# Patient Record
Sex: Female | Born: 1975 | Race: White | Hispanic: No | Smoking: Current every day smoker
Health system: Southern US, Community
[De-identification: ages and names within clinical notes are randomized; demographics above are authoritative.]

## PROBLEM LIST (undated history)

## (undated) DIAGNOSIS — G8929 Other chronic pain: Secondary | ICD-10-CM

## (undated) DIAGNOSIS — J449 Chronic obstructive pulmonary disease, unspecified: Secondary | ICD-10-CM

## (undated) DIAGNOSIS — M109 Gout, unspecified: Secondary | ICD-10-CM

## (undated) DIAGNOSIS — M549 Dorsalgia, unspecified: Secondary | ICD-10-CM

## (undated) DIAGNOSIS — F419 Anxiety disorder, unspecified: Secondary | ICD-10-CM

## (undated) DIAGNOSIS — F32A Depression, unspecified: Secondary | ICD-10-CM

## (undated) DIAGNOSIS — I10 Essential (primary) hypertension: Secondary | ICD-10-CM

## (undated) DIAGNOSIS — N189 Chronic kidney disease, unspecified: Secondary | ICD-10-CM

## (undated) DIAGNOSIS — F329 Major depressive disorder, single episode, unspecified: Secondary | ICD-10-CM

---

## 2003-01-22 ENCOUNTER — Ambulatory Visit (HOSPITAL_COMMUNITY): Admission: RE | Admit: 2003-01-22 | Discharge: 2003-01-22 | Payer: Self-pay | Admitting: Preventative Medicine

## 2003-01-22 ENCOUNTER — Encounter: Payer: Self-pay | Admitting: Preventative Medicine

## 2004-12-12 ENCOUNTER — Ambulatory Visit (HOSPITAL_COMMUNITY): Admission: RE | Admit: 2004-12-12 | Discharge: 2004-12-12 | Payer: Self-pay | Admitting: Preventative Medicine

## 2004-12-23 ENCOUNTER — Encounter: Admission: RE | Admit: 2004-12-23 | Discharge: 2004-12-23 | Payer: Self-pay | Admitting: Preventative Medicine

## 2005-01-06 ENCOUNTER — Encounter: Admission: RE | Admit: 2005-01-06 | Discharge: 2005-01-06 | Payer: Self-pay | Admitting: Preventative Medicine

## 2005-01-23 ENCOUNTER — Encounter: Admission: RE | Admit: 2005-01-23 | Discharge: 2005-01-23 | Payer: Self-pay | Admitting: Preventative Medicine

## 2005-01-27 ENCOUNTER — Ambulatory Visit (HOSPITAL_COMMUNITY): Admission: RE | Admit: 2005-01-27 | Discharge: 2005-01-27 | Payer: Self-pay | Admitting: Preventative Medicine

## 2005-05-19 ENCOUNTER — Emergency Department (HOSPITAL_COMMUNITY): Admission: EM | Admit: 2005-05-19 | Discharge: 2005-05-19 | Payer: Self-pay | Admitting: Emergency Medicine

## 2005-08-23 ENCOUNTER — Emergency Department (HOSPITAL_COMMUNITY): Admission: EM | Admit: 2005-08-23 | Discharge: 2005-08-23 | Payer: Self-pay | Admitting: Emergency Medicine

## 2005-11-14 ENCOUNTER — Emergency Department (HOSPITAL_COMMUNITY): Admission: EM | Admit: 2005-11-14 | Discharge: 2005-11-14 | Payer: Self-pay | Admitting: Emergency Medicine

## 2006-11-21 ENCOUNTER — Ambulatory Visit: Payer: Self-pay | Admitting: Family Medicine

## 2007-04-10 ENCOUNTER — Emergency Department (HOSPITAL_COMMUNITY): Admission: EM | Admit: 2007-04-10 | Discharge: 2007-04-10 | Payer: Self-pay | Admitting: Emergency Medicine

## 2007-04-17 ENCOUNTER — Emergency Department (HOSPITAL_COMMUNITY): Admission: EM | Admit: 2007-04-17 | Discharge: 2007-04-17 | Payer: Self-pay | Admitting: Emergency Medicine

## 2007-07-05 ENCOUNTER — Emergency Department (HOSPITAL_COMMUNITY): Admission: EM | Admit: 2007-07-05 | Discharge: 2007-07-05 | Payer: Self-pay | Admitting: Emergency Medicine

## 2007-09-14 ENCOUNTER — Emergency Department (HOSPITAL_COMMUNITY): Admission: EM | Admit: 2007-09-14 | Discharge: 2007-09-14 | Payer: Self-pay | Admitting: Emergency Medicine

## 2007-10-02 ENCOUNTER — Emergency Department (HOSPITAL_COMMUNITY): Admission: EM | Admit: 2007-10-02 | Discharge: 2007-10-02 | Payer: Self-pay | Admitting: Emergency Medicine

## 2008-08-22 ENCOUNTER — Emergency Department (HOSPITAL_COMMUNITY): Admission: EM | Admit: 2008-08-22 | Discharge: 2008-08-22 | Payer: Self-pay | Admitting: Emergency Medicine

## 2008-12-21 ENCOUNTER — Emergency Department (HOSPITAL_COMMUNITY): Admission: EM | Admit: 2008-12-21 | Discharge: 2008-12-21 | Payer: Self-pay | Admitting: Emergency Medicine

## 2009-03-05 ENCOUNTER — Emergency Department (HOSPITAL_COMMUNITY): Admission: EM | Admit: 2009-03-05 | Discharge: 2009-03-05 | Payer: Self-pay | Admitting: Emergency Medicine

## 2009-04-23 ENCOUNTER — Emergency Department (HOSPITAL_COMMUNITY): Admission: EM | Admit: 2009-04-23 | Discharge: 2009-04-23 | Payer: Self-pay | Admitting: Emergency Medicine

## 2009-06-13 ENCOUNTER — Emergency Department (HOSPITAL_COMMUNITY): Admission: EM | Admit: 2009-06-13 | Discharge: 2009-06-13 | Payer: Self-pay | Admitting: Emergency Medicine

## 2009-10-01 ENCOUNTER — Emergency Department (HOSPITAL_COMMUNITY): Admission: EM | Admit: 2009-10-01 | Discharge: 2009-10-01 | Payer: Self-pay | Admitting: Emergency Medicine

## 2010-01-29 ENCOUNTER — Emergency Department (HOSPITAL_COMMUNITY): Admission: EM | Admit: 2010-01-29 | Discharge: 2010-01-29 | Payer: Self-pay | Admitting: Emergency Medicine

## 2010-11-07 LAB — POCT CARDIAC MARKERS
CKMB, poc: 1.5 ng/mL (ref 1.0–8.0)
Troponin i, poc: <0.05 ng/mL (ref 0.00–0.09)

## 2010-11-07 LAB — D-DIMER, QUANTITATIVE: D-Dimer, Quant: 0.22 ug/mL-FEU (ref 0.00–0.48)

## 2010-11-09 LAB — DIFFERENTIAL
Basophils Absolute: 0 10*3/uL (ref 0.0–0.1)
Eosinophils Relative: 1 % (ref 0–5)
Monocytes Relative: 5 % (ref 3–12)
Neutro Abs: 5.8 10*3/uL (ref 1.7–7.7)

## 2010-11-09 LAB — CBC
HCT: 43.8 % (ref 36.0–46.0)
Platelets: 209 10*3/uL (ref 150–400)
RBC: 4.97 MIL/uL (ref 3.87–5.11)
RDW: 12.9 % (ref 11.5–15.5)
WBC: 7.8 10*3/uL (ref 4.0–10.5)

## 2010-11-09 LAB — URINALYSIS, ROUTINE W REFLEX MICROSCOPIC
Bilirubin Urine: NEGATIVE
Ketones, ur: NEGATIVE mg/dL
Leukocytes, UA: NEGATIVE
Protein, ur: >300 mg/dL — AB
Specific Gravity, Urine: 1.03 (ref 1.005–1.030)
Urobilinogen, UA: 0.2 mg/dL (ref 0.0–1.0)

## 2010-11-09 LAB — COMPREHENSIVE METABOLIC PANEL
AST: 16 U/L (ref 0–37)
Albumin: 3.5 g/dL (ref 3.5–5.2)
Alkaline Phosphatase: 49 U/L (ref 39–117)
GFR calc Af Amer: 57 mL/min — ABNORMAL LOW (ref >60)
Glucose, Bld: 95 mg/dL (ref 70–99)
Potassium: 4.1 mEq/L (ref 3.5–5.1)
Total Bilirubin: 0.6 mg/dL (ref 0.3–1.2)
Total Protein: 6.7 g/dL (ref 6.0–8.3)

## 2010-11-27 LAB — URINALYSIS, ROUTINE W REFLEX MICROSCOPIC
Bilirubin Urine: NEGATIVE
Glucose, UA: NEGATIVE mg/dL
Ketones, ur: NEGATIVE mg/dL
Nitrite: NEGATIVE
Specific Gravity, Urine: 1.025 (ref 1.005–1.030)

## 2014-07-28 ENCOUNTER — Emergency Department (HOSPITAL_COMMUNITY)
Admission: EM | Admit: 2014-07-28 | Discharge: 2014-07-28 | Disposition: A | Discharge status: Home / Self Care | Payer: Self-pay

## 2014-07-28 NOTE — ED Notes (Signed)
No answer

## 2014-11-05 ENCOUNTER — Emergency Department (HOSPITAL_COMMUNITY): Payer: Self-pay

## 2014-11-05 ENCOUNTER — Encounter (HOSPITAL_COMMUNITY): Payer: Self-pay | Admitting: *Deleted

## 2014-11-05 ENCOUNTER — Inpatient Hospital Stay (HOSPITAL_COMMUNITY)
Admission: EM | Admit: 2014-11-05 | Discharge: 2014-11-12 | DRG: 871 | Disposition: A | Discharge status: Home / Self Care | Payer: Self-pay | Attending: Internal Medicine | Admitting: Internal Medicine

## 2014-11-05 DIAGNOSIS — I10 Essential (primary) hypertension: Secondary | ICD-10-CM

## 2014-11-05 DIAGNOSIS — F17210 Nicotine dependence, cigarettes, uncomplicated: Secondary | ICD-10-CM | POA: Diagnosis present

## 2014-11-05 DIAGNOSIS — J189 Pneumonia, unspecified organism: Secondary | ICD-10-CM

## 2014-11-05 DIAGNOSIS — J984 Other disorders of lung: Secondary | ICD-10-CM

## 2014-11-05 DIAGNOSIS — Z79899 Other long term (current) drug therapy: Secondary | ICD-10-CM

## 2014-11-05 DIAGNOSIS — N189 Chronic kidney disease, unspecified: Secondary | ICD-10-CM | POA: Diagnosis present

## 2014-11-05 DIAGNOSIS — J852 Abscess of lung without pneumonia: Secondary | ICD-10-CM | POA: Diagnosis present

## 2014-11-05 DIAGNOSIS — R0602 Shortness of breath: Secondary | ICD-10-CM

## 2014-11-05 DIAGNOSIS — N170 Acute kidney failure with tubular necrosis: Secondary | ICD-10-CM | POA: Diagnosis present

## 2014-11-05 DIAGNOSIS — F419 Anxiety disorder, unspecified: Secondary | ICD-10-CM | POA: Diagnosis present

## 2014-11-05 DIAGNOSIS — R509 Fever, unspecified: Secondary | ICD-10-CM

## 2014-11-05 DIAGNOSIS — N181 Chronic kidney disease, stage 1: Secondary | ICD-10-CM | POA: Diagnosis present

## 2014-11-05 DIAGNOSIS — D72829 Elevated white blood cell count, unspecified: Secondary | ICD-10-CM | POA: Diagnosis present

## 2014-11-05 DIAGNOSIS — N179 Acute kidney failure, unspecified: Secondary | ICD-10-CM | POA: Diagnosis present

## 2014-11-05 DIAGNOSIS — G8929 Other chronic pain: Secondary | ICD-10-CM | POA: Diagnosis present

## 2014-11-05 DIAGNOSIS — J851 Abscess of lung with pneumonia: Secondary | ICD-10-CM | POA: Diagnosis present

## 2014-11-05 DIAGNOSIS — A419 Sepsis, unspecified organism: Principal | ICD-10-CM | POA: Diagnosis present

## 2014-11-05 DIAGNOSIS — D649 Anemia, unspecified: Secondary | ICD-10-CM | POA: Diagnosis present

## 2014-11-05 DIAGNOSIS — M549 Dorsalgia, unspecified: Secondary | ICD-10-CM | POA: Diagnosis present

## 2014-11-05 DIAGNOSIS — I129 Hypertensive chronic kidney disease with stage 1 through stage 4 chronic kidney disease, or unspecified chronic kidney disease: Secondary | ICD-10-CM | POA: Diagnosis present

## 2014-11-05 DIAGNOSIS — E876 Hypokalemia: Secondary | ICD-10-CM | POA: Diagnosis not present

## 2014-11-05 HISTORY — DX: Essential (primary) hypertension: I10

## 2014-11-05 HISTORY — DX: Dorsalgia, unspecified: M54.9

## 2014-11-05 HISTORY — DX: Other chronic pain: G89.29

## 2014-11-05 HISTORY — DX: Anxiety disorder, unspecified: F41.9

## 2014-11-05 LAB — CBC WITH DIFFERENTIAL/PLATELET
BASOS PCT: 0 % (ref 0–1)
Basophils Absolute: 0 10*3/uL (ref 0.0–0.1)
Eosinophils Absolute: 0.1 10*3/uL (ref 0.0–0.7)
Eosinophils Relative: 1 % (ref 0–5)
HCT: 33.6 % — ABNORMAL LOW (ref 36.0–46.0)
Hemoglobin: 11.3 g/dL — ABNORMAL LOW (ref 12.0–15.0)
LYMPHS PCT: 32 % (ref 12–46)
Lymphs Abs: 4.3 10*3/uL — ABNORMAL HIGH (ref 0.7–4.0)
MCH: 29.7 pg (ref 26.0–34.0)
MCHC: 33.6 g/dL (ref 30.0–36.0)
MCV: 88.4 fL (ref 78.0–100.0)
MONOS PCT: 8 % (ref 3–12)
Monocytes Absolute: 1.1 10*3/uL — ABNORMAL HIGH (ref 0.1–1.0)
NEUTROS ABS: 7.8 10*3/uL — AB (ref 1.7–7.7)
Neutrophils Relative %: 59 % (ref 43–77)
Platelets: 337 10*3/uL (ref 150–400)
RBC: 3.8 MIL/uL — ABNORMAL LOW (ref 3.87–5.11)
RDW: 13.3 % (ref 11.5–15.5)
WBC: 13.2 10*3/uL — ABNORMAL HIGH (ref 4.0–10.5)

## 2014-11-05 LAB — COMPREHENSIVE METABOLIC PANEL
ALT: 23 U/L (ref 0–35)
ANION GAP: 6 (ref 5–15)
AST: 14 U/L (ref 0–37)
Albumin: 2.9 g/dL — ABNORMAL LOW (ref 3.5–5.2)
Alkaline Phosphatase: 56 U/L (ref 39–117)
BUN: 12 mg/dL (ref 6–23)
CALCIUM: 8.2 mg/dL — AB (ref 8.4–10.5)
CO2: 20 mmol/L (ref 19–32)
Chloride: 112 mmol/L (ref 96–112)
Creatinine, Ser: 1.4 mg/dL — ABNORMAL HIGH (ref 0.50–1.10)
GFR calc Af Amer: 54 mL/min — ABNORMAL LOW (ref >90)
GFR calc non Af Amer: 47 mL/min — ABNORMAL LOW (ref >90)
GLUCOSE: 99 mg/dL (ref 70–99)
Potassium: 3.5 mmol/L (ref 3.5–5.1)
Sodium: 138 mmol/L (ref 135–145)
Total Bilirubin: 0.4 mg/dL (ref 0.3–1.2)
Total Protein: 6.7 g/dL (ref 6.0–8.3)

## 2014-11-05 LAB — URINALYSIS, ROUTINE W REFLEX MICROSCOPIC
Bilirubin Urine: NEGATIVE
Glucose, UA: NEGATIVE mg/dL
Hgb urine dipstick: NEGATIVE
Ketones, ur: NEGATIVE mg/dL
Leukocytes, UA: NEGATIVE
Nitrite: NEGATIVE
PH: 5.5 (ref 5.0–8.0)
Protein, ur: NEGATIVE mg/dL
SPECIFIC GRAVITY, URINE: 1.02 (ref 1.005–1.030)
UROBILINOGEN UA: 0.2 mg/dL (ref 0.0–1.0)

## 2014-11-05 MED ORDER — LORAZEPAM 1 MG PO TABS
1.0000 mg | ORAL_TABLET | Freq: Four times a day (QID) | ORAL | Status: DC | PRN
  Administered 2014-11-05 – 2014-11-09 (×7): 1 mg via ORAL
  Filled 2014-11-05 (×7): qty 1

## 2014-11-05 MED ORDER — IPRATROPIUM-ALBUTEROL 0.5-2.5 (3) MG/3ML IN SOLN
3.0000 mL | Freq: Once | RESPIRATORY_TRACT | Status: AC
  Administered 2014-11-05: 3 mL via RESPIRATORY_TRACT
  Filled 2014-11-05: qty 3

## 2014-11-05 MED ORDER — MORPHINE SULFATE 2 MG/ML IJ SOLN
2.0000 mg | Freq: Once | INTRAMUSCULAR | Status: AC
  Administered 2014-11-05: 2 mg via INTRAVENOUS
  Filled 2014-11-05: qty 1

## 2014-11-05 MED ORDER — VANCOMYCIN HCL IN DEXTROSE 1-5 GM/200ML-% IV SOLN
1000.0000 mg | Freq: Once | INTRAVENOUS | Status: AC
  Administered 2014-11-05: 1000 mg via INTRAVENOUS
  Filled 2014-11-05: qty 200

## 2014-11-05 MED ORDER — ONDANSETRON 4 MG PO TBDP
4.0000 mg | ORAL_TABLET | Freq: Once | ORAL | Status: AC
  Administered 2014-11-05: 4 mg via ORAL
  Filled 2014-11-05: qty 1

## 2014-11-05 MED ORDER — PIPERACILLIN-TAZOBACTAM 3.375 G IVPB
3.3750 g | Freq: Three times a day (TID) | INTRAVENOUS | Status: DC
  Administered 2014-11-05 – 2014-11-12 (×20): 3.375 g via INTRAVENOUS
  Filled 2014-11-05 (×23): qty 50

## 2014-11-05 MED ORDER — BENZONATATE 100 MG PO CAPS
ORAL_CAPSULE | ORAL | Status: AC
  Filled 2014-11-05: qty 2

## 2014-11-05 MED ORDER — IOHEXOL 300 MG/ML  SOLN
100.0000 mL | Freq: Once | INTRAMUSCULAR | Status: AC | PRN
Start: 2014-11-05 — End: 2014-11-05

## 2014-11-05 MED ORDER — LISINOPRIL-HYDROCHLOROTHIAZIDE 10-12.5 MG PO TABS
1.0000 | ORAL_TABLET | Freq: Every day | ORAL | Status: DC
  Administered 2014-11-05: 1 via ORAL

## 2014-11-05 MED ORDER — VANCOMYCIN HCL IN DEXTROSE 750-5 MG/150ML-% IV SOLN
750.0000 mg | Freq: Two times a day (BID) | INTRAVENOUS | Status: DC
  Administered 2014-11-06 – 2014-11-11 (×11): 750 mg via INTRAVENOUS
  Filled 2014-11-05 (×16): qty 150

## 2014-11-05 MED ORDER — BENZONATATE 100 MG PO CAPS
200.0000 mg | ORAL_CAPSULE | Freq: Once | ORAL | Status: AC
  Administered 2014-11-05: 200 mg via ORAL

## 2014-11-05 MED ORDER — HYDROCHLOROTHIAZIDE 12.5 MG PO CAPS
12.5000 mg | ORAL_CAPSULE | Freq: Every day | ORAL | Status: DC
  Administered 2014-11-06 – 2014-11-12 (×7): 12.5 mg via ORAL
  Filled 2014-11-05 (×7): qty 1

## 2014-11-05 MED ORDER — IOHEXOL 300 MG/ML  SOLN
80.0000 mL | Freq: Once | INTRAMUSCULAR | Status: AC | PRN
  Administered 2014-11-05: 80 mL via INTRAVENOUS

## 2014-11-05 MED ORDER — HEPARIN SODIUM (PORCINE) 5000 UNIT/ML IJ SOLN
5000.0000 [IU] | Freq: Three times a day (TID) | INTRAMUSCULAR | Status: DC
  Administered 2014-11-06 – 2014-11-12 (×18): 5000 [IU] via SUBCUTANEOUS
  Filled 2014-11-05 (×15): qty 1

## 2014-11-05 MED ORDER — HYDROCOD POLST-CHLORPHEN POLST 10-8 MG/5ML PO LQCR
5.0000 mL | Freq: Once | ORAL | Status: AC
  Administered 2014-11-05: 5 mL via ORAL
  Filled 2014-11-05: qty 5

## 2014-11-05 MED ORDER — OXYCODONE-ACETAMINOPHEN 5-325 MG PO TABS
1.0000 | ORAL_TABLET | Freq: Once | ORAL | Status: AC
  Administered 2014-11-05: 1 via ORAL
  Filled 2014-11-05: qty 1

## 2014-11-05 MED ORDER — LISINOPRIL 10 MG PO TABS
10.0000 mg | ORAL_TABLET | Freq: Every day | ORAL | Status: DC
  Administered 2014-11-06 – 2014-11-12 (×7): 10 mg via ORAL
  Filled 2014-11-05 (×7): qty 1

## 2014-11-05 MED ORDER — SODIUM CHLORIDE 0.9 % IV SOLN
INTRAVENOUS | Status: DC
  Administered 2014-11-05 – 2014-11-06 (×3): via INTRAVENOUS
  Administered 2014-11-06 – 2014-11-08 (×4): 1 mL via INTRAVENOUS
  Administered 2014-11-09 – 2014-11-12 (×4): via INTRAVENOUS
  Filled 2014-11-05: qty 1000

## 2014-11-05 NOTE — ED Notes (Signed)
Report given to Vania Rea, RN with [facility]

## 2014-11-05 NOTE — ED Provider Notes (Signed)
CSN: [id number]     Arrival date & time 11/05/14  1105 History   First MD Initiated Contact with Patient 11/05/14 1259     Chief Complaint  Patient presents with  . Cough     (Consider location/radiation/quality/duration/timing/severity/associated sxs/prior Treatment) Patient is a 39 y.o. female presenting with cough. The history is provided by the patient.  Cough Cough characteristics:  Productive Sputum characteristics:  Green Severity:  Severe Onset quality:  Gradual Duration:  1 week Timing:  Constant Progression:  Worsening Chronicity:  New Smoker: yes   Relieved by:  Nothing Worsened by:  Activity, lying down and smoking Ineffective treatments: OTC cough and congestion. Associated symptoms: chest pain, chills and shortness of breath    Margaret Yoder is a 39 y.o. female with a hx of smoking 1 and 1/2 packs per day x 15 years. She complains of cough for over a week that has gotten progressively worse. She states she could not sleep last night for coughing so much. She complains of feeling really weak and tired. She reports the cough is productive with green sputum that has a really bad taste. She feels short of breath. She denies having traveled out of the country recently.   Past Medical History  Diagnosis Date  . Hypertension   . Anxiety   . Chronic back pain    History reviewed. No pertinent past surgical history. No family history on file. History  Substance Use Topics  . Smoking status: Current Every Day Smoker    Types: Cigarettes  . Smokeless tobacco: Not on file  . Alcohol Use: No   OB History    No data available     Review of Systems  Constitutional: Positive for chills.  Respiratory: Positive for cough and shortness of breath.   Cardiovascular: Positive for chest pain.  Neurological: Positive for weakness and light-headedness.  all other systems negtive    Allergies  Review of patient's allergies indicates no known allergies.  Home  Medications   Prior to Admission medications   Medication Sig Start Date End Date Taking? Authorizing Provider  lisinopril-hydrochlorothiazide (PRINZIDE,ZESTORETIC) 10-12.5 MG per tablet Take 1 tablet by mouth daily.   Yes Historical Provider, MD  LORazepam (ATIVAN) 1 MG tablet Take 1 mg by mouth every 6 (six) hours as needed for anxiety.   Yes Historical Provider, MD   BP 126/87 mmHg  Pulse 83  Temp(Src) 97.8 F (36.6 C) (Oral)  Resp 14  Ht 5\' 3"  (1.6 m)  Wt 170 lb (77.111 kg)  BMI 30.12 kg/m2  SpO2 100%  LMP 11/02/2014 Physical Exam  Constitutional: She is oriented to person, place, and time. She appears well-developed and well-nourished.  HENT:  Head: Normocephalic.  Eyes: Conjunctivae and EOM are normal.  Neck: Neck supple.  Cardiovascular: Normal rate and regular rhythm.   Pulmonary/Chest: No respiratory distress. She has decreased breath sounds in the right upper field and the right middle field. She has wheezes. She has no rales.  Abdominal: Soft. There is no tenderness.  Musculoskeletal: Normal range of motion.  Neurological: She is alert and oriented to person, place, and time. No cranial nerve deficit.  Skin: Skin is warm and dry.  Psychiatric: She has a normal mood and affect. Her behavior is normal.  Nursing note and vitals reviewed.   ED Course  Procedures (including critical care time) Results for orders placed or performed during the hospital encounter of 11/05/14 (from the past 24 hour(s))  CBC with Differential/Platelet  Status: Abnormal   Collection Time: 11/05/14  4:20 PM  Result Value Ref Range   WBC 13.2 (H) 4.0 - 10.5 K/uL   RBC 3.80 (L) 3.87 - 5.11 MIL/uL   Hemoglobin 11.3 (L) 12.0 - 15.0 g/dL   HCT 33.6 (L) 36.0 - 46.0 %   MCV 88.4 78.0 - 100.0 fL   MCH 29.7 26.0 - 34.0 pg   MCHC 33.6 30.0 - 36.0 g/dL   RDW 13.3 11.5 - 15.5 %   Platelets 337 150 - 400 K/uL   Neutrophils Relative % 59 43 - 77 %   Neutro Abs 7.8 (H) 1.7 - 7.7 K/uL    Lymphocytes Relative 32 12 - 46 %   Lymphs Abs 4.3 (H) 0.7 - 4.0 K/uL   Monocytes Relative 8 3 - 12 %   Monocytes Absolute 1.1 (H) 0.1 - 1.0 K/uL   Eosinophils Relative 1 0 - 5 %   Eosinophils Absolute 0.1 0.0 - 0.7 K/uL   Basophils Relative 0 0 - 1 %   Basophils Absolute 0.0 0.0 - 0.1 K/uL  Comprehensive metabolic panel     Status: Abnormal   Collection Time: 11/05/14  4:20 PM  Result Value Ref Range   Sodium 138 135 - 145 mmol/L   Potassium 3.5 3.5 - 5.1 mmol/L   Chloride 112 96 - 112 mmol/L   CO2 20 19 - 32 mmol/L   Glucose, Bld 99 70 - 99 mg/dL   BUN 12 6 - 23 mg/dL   Creatinine, Ser 1.40 (H) 0.50 - 1.10 mg/dL   Calcium 8.2 (L) 8.4 - 10.5 mg/dL   Total Protein 6.7 6.0 - 8.3 g/dL   Albumin 2.9 (L) 3.5 - 5.2 g/dL   AST 14 0 - 37 U/L   ALT 23 0 - 35 U/L   Alkaline Phosphatase 56 39 - 117 U/L   Total Bilirubin 0.4 0.3 - 1.2 mg/dL   GFR calc non Af Amer 47 (L) >90 mL/min   GFR calc Af Amer 54 (L) >90 mL/min   Anion gap 6 5 - 15    Imaging Review Dg Chest 2 View  11/05/2014   CLINICAL DATA:  Initial evaluation cough shortness of breath 1 week, smoking history  EXAM: CHEST  2 VIEW  COMPARISON:  05/30/2011  FINDINGS: Heart size and vascular pattern are normal. Left lung is clear. There is abnormal masslike density in the right hilum, measuring about 5 x 5 cm.There is no pleural effusion.  IMPRESSION: Mass-like right perihilar density. Malignancy expected. Contrast-enhanced CT thorax recommended.   Electronically Signed   By: Skipper Cliche M.D.   On: 11/05/2014 12:05   Ct Chest W Contrast  11/05/2014   CLINICAL DATA:  Productive cough. Suspicious mass-like density in the right hilum on recent chest radiograph.  EXAM: CT CHEST WITH CONTRAST  TECHNIQUE: Multidetector CT imaging of the chest was performed during intravenous contrast administration.  CONTRAST:  45mL OMNIPAQUE IOHEXOL 300 MG/ML  SOLN  COMPARISON:  None.  FINDINGS: Thyroid tissues is diffusely enlarged and mildly  heterogeneous. Isthmus measures 2 cm in thickness. Precarinal soft tissue measures 1.3 cm in the short axis on sequence 2, image 27. There is an upper right paratracheal lymph node that measures 0.9 cm in short axis on sequence 2, image 14. Tiny supraclavicular lymph nodes on both sides. There is no significant axillary lymphadenopathy. No significant pericardial or pleural fluid.  Trachea and mainstem bronchi are patent. There is an air-fluid collection in the central  right lung which corresponds with the hilar mass on the recent chest radiograph. This lesion roughly measures 4.0 x 3.8 x 3.6 cm. There are small parenchymal densities in the right upper lobe with a tree-in-bud configuration. The central right lung lesion is located in the right upper lobe and bulging into the right minor fissure. Left lung is clear.  There is a large calcified gallstone measuring up to 2.5 cm. Normal appearance of the adrenal glands. No significant lymphadenopathy in the upper abdomen.  No acute bone abnormality.  IMPRESSION: There is a cavitary lesion in the central right upper lobe. This structure measures up to 4 cm and contains both air and fluid. Findings are concerning for a pulmonary abscess. A cavitary lung neoplasm is also in the differential diagnosis. There are small lymph nodes in the mediastinum which are indeterminate.  Parenchymal densities in the right upper lobe with a tree-in-bud configuration. These parenchymal densities are suggestive for an infectious or inflammatory process.  Gallstone.  Thyromegaly of unknown etiology.  These results were called by telephone at the time of interpretation on 11/05/2014 at 3:40 pm to Dr. Alvino Chapel , who verbally acknowledged these results.   Electronically Signed   By: Markus Daft M.D.   On: 11/05/2014 15:41   Labs, x-ray, CT of chest, Vancomycin 1 gram IV, consult with Dr.Gosrani  MDM  39 y.o. female with shortness of breath and cough that has been ongoing for the past week.  Symptoms have gotten much worsen over the past 2 days. Consult with Dr. Anastasio Champion and he will see the patient in the ED and assess for admission for [facility]. Discussed with the patient clinical, x-ray and CT findings and plan of care and all questioned fully answered.   Final diagnoses:  Shortness of breath  Cavitating mass in right upper lung lobe      [facility], NP 11/05/14 1729  Milton Ferguson, MD 11/09/14 [phone]

## 2014-11-05 NOTE — ED Notes (Signed)
Sick for 3-4 days, with cough/fever.  Feels sob.  Hx of bronchitits

## 2014-11-05 NOTE — ED Notes (Signed)
Pt states productive cough at times, green in color, stating she feels like she has bronchitis. Cough x 1 week. Unable to sleep last night due to the cough.

## 2014-11-05 NOTE — ED Notes (Signed)
Resting quietly, Dr Alvino Chapel in to see pt.

## 2014-11-05 NOTE — ED Notes (Signed)
Report given to Apolonio Schneiders RN at [facility]

## 2014-11-05 NOTE — Progress Notes (Addendum)
ANTIBIOTIC CONSULT NOTE  Pharmacy Consult for Vancomycin & Zosyn Indication: pneumonia  No Known Allergies  Patient Measurements: Height: 5\' 3"  (160 cm) Weight: 170 lb (77.111 kg) IBW/kg (Calculated) : 52.4  Vital Signs: Temp: 98 F (36.7 C) (03/17 1846) Temp Source: Oral (03/17 1846) BP: 124/77 mmHg (03/17 2000) Pulse Rate: 89 (03/17 2000) Intake/Output from previous day:   Intake/Output from this shift:    Labs:  Recent Labs  11/05/14 1620  WBC 13.2*  HGB 11.3*  PLT 337  CREATININE 1.40*   Estimated Creatinine Clearance: 53.1 mL/min (by C-G formula based on Cr of 1.4). No results for input(s): VANCOTROUGH, VANCOPEAK, VANCORANDOM, GENTTROUGH, GENTPEAK, GENTRANDOM, TOBRATROUGH, TOBRAPEAK, TOBRARND, AMIKACINPEAK, AMIKACINTROU, AMIKACIN in the last 72 hours.   Microbiology: Recent Results (from the past 720 hour(s))  Culture, blood (routine x 2) Call MD if unable to obtain prior to antibiotics being given     Status: None (Preliminary result)   Collection Time: 11/05/14  4:20 PM  Result Value Ref Range Status   Specimen Description BLOOD LEFT ARM  Final   Special Requests BOTTLES DRAWN AEROBIC AND ANAEROBIC [place]  Final   Culture PENDING  Incomplete   Report Status PENDING  Incomplete  Culture, blood (routine x 2) Call MD if unable to obtain prior to antibiotics being given     Status: None (Preliminary result)   Collection Time: 11/05/14  6:52 PM  Result Value Ref Range Status   Specimen Description BLOOD LEFT ARM  Final   Special Requests BOTTLES DRAWN AEROBIC AND ANAEROBIC [facility] EACH  Final   Culture PENDING  Incomplete   Report Status PENDING  Incomplete    Anti-infectives    Start     Dose/Rate Route Frequency Ordered Stop   11/05/14 1830  piperacillin-tazobactam (ZOSYN) IVPB 3.375 g     3.375 g 12.5 mL/hr over 240 Minutes Intravenous Every 8 hours 11/05/14 1823     11/05/14 1615  vancomycin (VANCOCIN) IVPB 1000 mg/200 mL premix     1,000 mg 200 mL/hr  over 60 Minutes Intravenous  Once 11/05/14 1604 11/05/14 1715      Assessment: 53 yoF who presents with productive cough & fever x1 week. Chest CT + cavitary lesion, abscess.   She has been afebrile since admission with mild leukocytosis.   Scr currently above patient's baseline.  Estimated CrCl ~ 50-8ml/min.  Vancomycin 1gm already given in ED. Patient transferring to [facility] for [facility] evaluation.   Zosyn 3/17>> Vancomycin 3/17>>  Goal of Therapy:  Vancomycin trough level 15-20 mcg/ml  Eradicate infection.  Plan:  Zosyn 3.375gm IV Q8h to be infused over 4hrs Vancomycin 750mg  IV q12h Check Vancomycin trough at steady state Monitor renal function and cx data  Duration of therapy per MD Consider change Zosyn to Rocephin + Zithromax for better coverage of community-acquired pathogens (legionella coverage)  Biagio Borg 11/05/2014,10:09 PM

## 2014-11-05 NOTE — H&P (Signed)
[facility] History and Physical  Margaret Yoder [id number] DOB: 1975-12-04 DOA: 11/05/2014  Referring physician: ER PCP: No primary care provider on file.   Chief Complaint: Cough.  HPI: Margaret Yoder is a 39 y.o. female  This is a 39 year old lady, smoker, who presents with one-week history of a productive cough of green/brown sputum associated with subjective fever. She also feels slightly short of breath more than usual. She has been feeling weak/fatigue. She also has had some chills. She says she has lost weight but she quantifies this as less than 10 pounds in the last few months. She denies any hemoptysis. There is no sweating, especially night sweats. She has not had any travel abroad. She has no history of diabetes or HIV disease that she knows about. Evaluation in the emergency room with a CT scan of the chest is consistent with an abscess, neoplasm cannot be excluded. She is now being admitted for further investigation and management.   Review of Systems:  Apart from symptoms above, all systems negative.  Past Medical History  Diagnosis Date  . Hypertension   . Anxiety   . Chronic back pain    History reviewed. No pertinent past surgical history. Social History:  reports that she has been smoking Cigarettes.  She does not have any smokeless tobacco history on file. She reports that she does not drink alcohol. Her drug history is not on file.  No Known Allergies   Family history: No family history of lung disease.   Prior to Admission medications   Medication Sig Start Date End Date Taking? Authorizing Provider  lisinopril-hydrochlorothiazide (PRINZIDE,ZESTORETIC) 10-12.5 MG per tablet Take 1 tablet by mouth daily.   Yes Historical Provider, MD  LORazepam (ATIVAN) 1 MG tablet Take 1 mg by mouth every 6 (six) hours as needed for anxiety.   Yes Historical Provider, MD   Physical Exam: Filed Vitals:   11/05/14 1113 11/05/14 1551 11/05/14 1604 11/05/14  1705  BP: 124/81  116/72 126/87  Pulse: 108  96 83  Temp: 98.8 F (37.1 C)  98.3 F (36.8 C) 97.8 F (36.6 C)  TempSrc: Oral  Oral Oral  Resp: 20   14  Height: 5\' 3"  (1.6 m)     Weight: 77.111 kg (170 lb)     SpO2: 99% 99% 96% 100%    Wt Readings from Last 3 Encounters:  11/05/14 77.111 kg (170 lb)    General:  Appears calm and comfortable. She does not look toxic or septic. There is no clubbing. There is no respiratory distress. There is no supraclavicular lymphadenopathy. Eyes: PERRL, normal lids, irises & conjunctiva ENT: grossly normal hearing, lips & tongue Neck: no LAD, masses or thyromegaly Cardiovascular: RRR, no m/r/g. No LE edema. Telemetry: SR, no arrhythmias  Respiratory: CTA bilaterally, no w/r/r. Normal respiratory effort. Abdomen: soft, ntnd. There is no hepatosplenomegaly. Skin: no rash or induration seen on limited exam Musculoskeletal: grossly normal tone BUE/BLE Psychiatric: grossly normal mood and affect, speech fluent and appropriate Neurologic: grossly non-focal.          Labs on Admission:  Basic Metabolic Panel:  Recent Labs Lab 11/05/14 1620  NA 138  K 3.5  CL 112  CO2 20  GLUCOSE 99  BUN 12  CREATININE 1.40*  CALCIUM 8.2*   Liver Function Tests:  Recent Labs Lab 11/05/14 1620  AST 14  ALT 23  ALKPHOS 56  BILITOT 0.4  PROT 6.7  ALBUMIN 2.9*   No results for input(s):  LIPASE, AMYLASE in the last 168 hours. No results for input(s): AMMONIA in the last 168 hours. CBC:  Recent Labs Lab 11/05/14 1620  WBC 13.2*  NEUTROABS 7.8*  HGB 11.3*  HCT 33.6*  MCV 88.4  PLT 337   Cardiac Enzymes: No results for input(s): CKTOTAL, CKMB, CKMBINDEX, TROPONINI in the last 168 hours.  BNP (last 3 results) No results for input(s): BNP in the last 8760 hours.  ProBNP (last 3 results) No results for input(s): PROBNP in the last 8760 hours.  CBG: No results for input(s): GLUCAP in the last 168 hours.  Radiological Exams on  Admission: Dg Chest 2 View  11/05/2014   CLINICAL DATA:  Initial evaluation cough shortness of breath 1 week, smoking history  EXAM: CHEST  2 VIEW  COMPARISON:  05/30/2011  FINDINGS: Heart size and vascular pattern are normal. Left lung is clear. There is abnormal masslike density in the right hilum, measuring about 5 x 5 cm.There is no pleural effusion.  IMPRESSION: Mass-like right perihilar density. Malignancy expected. Contrast-enhanced CT thorax recommended.   Electronically Signed   By: Skipper Cliche M.D.   On: 11/05/2014 12:05   Ct Chest W Contrast  11/05/2014   CLINICAL DATA:  Productive cough. Suspicious mass-like density in the right hilum on recent chest radiograph.  EXAM: CT CHEST WITH CONTRAST  TECHNIQUE: Multidetector CT imaging of the chest was performed during intravenous contrast administration.  CONTRAST:  66mL OMNIPAQUE IOHEXOL 300 MG/ML  SOLN  COMPARISON:  None.  FINDINGS: Thyroid tissues is diffusely enlarged and mildly heterogeneous. Isthmus measures 2 cm in thickness. Precarinal soft tissue measures 1.3 cm in the short axis on sequence 2, image 27. There is an upper right paratracheal lymph node that measures 0.9 cm in short axis on sequence 2, image 14. Tiny supraclavicular lymph nodes on both sides. There is no significant axillary lymphadenopathy. No significant pericardial or pleural fluid.  Trachea and mainstem bronchi are patent. There is an air-fluid collection in the central right lung which corresponds with the hilar mass on the recent chest radiograph. This lesion roughly measures 4.0 x 3.8 x 3.6 cm. There are small parenchymal densities in the right upper lobe with a tree-in-bud configuration. The central right lung lesion is located in the right upper lobe and bulging into the right minor fissure. Left lung is clear.  There is a large calcified gallstone measuring up to 2.5 cm. Normal appearance of the adrenal glands. No significant lymphadenopathy in the upper abdomen.  No  acute bone abnormality.  IMPRESSION: There is a cavitary lesion in the central right upper lobe. This structure measures up to 4 cm and contains both air and fluid. Findings are concerning for a pulmonary abscess. A cavitary lung neoplasm is also in the differential diagnosis. There are small lymph nodes in the mediastinum which are indeterminate.  Parenchymal densities in the right upper lobe with a tree-in-bud configuration. These parenchymal densities are suggestive for an infectious or inflammatory process.  Gallstone.  Thyromegaly of unknown etiology.  These results were called by telephone at the time of interpretation on 11/05/2014 at 3:40 pm to Dr. Alvino Chapel , who verbally acknowledged these results.   Electronically Signed   By: Markus Daft M.D.   On: 11/05/2014 15:41      Assessment/Plan   1. Community-acquired pneumonia with probable abscess. The report of the CT scan suggestive of pulmonary abscess but a lung neoplasm is also in the differential. She is a smoker of 15 years.  We will treat her empirically with fairly broad-spectrum antibiotics. We will obtain sputum cultures. We will check HIV status. We will ask thoracic surgery to review this patient. I've spoken to Dr. Prescott Gum , thoracic surgeon, who has kindly agreed to see the patient in consultation when the patient gets to [facility] where she will be transferred to tonight. 2.  3. Hypertension. Stable.  Further recommendations will depend on patient's hospital progress.   Code Status: Full code.   DVT Prophylaxis: Heparin.  Family Communication: I discussed the plan with the patient at the bedside.  Disposition Plan: Home when medically stable.   Time spent: 60 minutes.  Doree Albee [facility] Pager [phone].

## 2014-11-05 NOTE — ED Notes (Signed)
Pt requesting something else for her cough, Dr. Roderic Palau informed and orders given and carried out

## 2014-11-06 ENCOUNTER — Encounter (HOSPITAL_COMMUNITY): Payer: Self-pay

## 2014-11-06 DIAGNOSIS — J852 Abscess of lung without pneumonia: Secondary | ICD-10-CM

## 2014-11-06 LAB — CREATININE, URINE, RANDOM: CREATININE, URINE: 43.66 mg/dL

## 2014-11-06 LAB — EXPECTORATED SPUTUM ASSESSMENT W GRAM STAIN, RFLX TO RESP C

## 2014-11-06 LAB — INFLUENZA PANEL BY PCR (TYPE A & B)
H1N1FLUPCR: NOT DETECTED
INFLAPCR: NEGATIVE
INFLBPCR: NEGATIVE

## 2014-11-06 LAB — EXPECTORATED SPUTUM ASSESSMENT W REFEX TO RESP CULTURE

## 2014-11-06 LAB — STREP PNEUMONIAE URINARY ANTIGEN: STREP PNEUMO URINARY ANTIGEN: NEGATIVE

## 2014-11-06 LAB — SODIUM, URINE, RANDOM: Sodium, Ur: 103 mmol/L

## 2014-11-06 MED ORDER — GUAIFENESIN 100 MG/5ML PO SYRP
200.0000 mg | ORAL_SOLUTION | ORAL | Status: DC | PRN
Start: 2014-11-06 — End: 2014-11-07
  Administered 2014-11-06 – 2014-11-07 (×4): 200 mg via ORAL
  Filled 2014-11-06 (×7): qty 10

## 2014-11-06 MED ORDER — OXYCODONE-ACETAMINOPHEN 5-325 MG PO TABS
2.0000 | ORAL_TABLET | Freq: Once | ORAL | Status: AC
Start: 2014-11-06 — End: 2014-11-06
  Administered 2014-11-06: 2 via ORAL

## 2014-11-06 MED ORDER — PNEUMOCOCCAL VAC POLYVALENT 25 MCG/0.5ML IJ INJ: 0.5000 mL | INJECTION | INTRAMUSCULAR | Status: DC

## 2014-11-06 MED ORDER — HYDROCOD POLST-CHLORPHEN POLST 10-8 MG/5ML PO LQCR
5.0000 mL | Freq: Once | ORAL | Status: AC
  Administered 2014-11-06: 5 mL via ORAL
  Filled 2014-11-06: qty 5

## 2014-11-06 MED ORDER — HYDROCOD POLST-CHLORPHEN POLST 10-8 MG/5ML PO LQCR
5.0000 mL | Freq: Two times a day (BID) | ORAL | Status: DC | PRN
  Administered 2014-11-06 – 2014-11-10 (×9): 5 mL via ORAL
  Filled 2014-11-06 (×9): qty 5

## 2014-11-06 MED ORDER — OXYCODONE-ACETAMINOPHEN 5-325 MG PO TABS
1.0000 | ORAL_TABLET | ORAL | Status: DC | PRN
  Administered 2014-11-06 – 2014-11-12 (×28): 1 via ORAL
  Filled 2014-11-06 (×28): qty 1

## 2014-11-06 MED ORDER — ENSURE COMPLETE PO LIQD
237.0000 mL | Freq: Two times a day (BID) | ORAL | Status: DC
  Administered 2014-11-06: 237 mL via ORAL
  Filled 2014-11-06 (×6): qty 237

## 2014-11-06 MED ORDER — NICOTINE 21 MG/24HR TD PT24
21.0000 mg | MEDICATED_PATCH | Freq: Every day | TRANSDERMAL | Status: DC
  Administered 2014-11-06 – 2014-11-12 (×7): 21 mg via TRANSDERMAL
  Filled 2014-11-06 (×9): qty 1

## 2014-11-06 NOTE — Progress Notes (Signed)
[facility] PROGRESS NOTE  Margaret Yoder [id number] DOB: [birth year] DOA: 11/05/2014 PCP: No primary care provider on file. Interim summary: 39 year old lady admitted for subjective fevers and chills. She was found to have pneumonia on CXR and possible lung abscess on CT chest.  Assessment/Plan: CAP with possible lung abscess: She was initially started on broad spectrum antibiotics and blood cultures and sputum cultures ordered and pending.  She is requiring 2 to 3 liters of [place] oxygen to keep sats >90%.  Robitussin as needed.  Smoking cessation counseled.  Thoracic surgery consulted.  HIV antibody is pending.  Urine for legionella and strep antigen pending.   Continue with hydration.    Hypertension controlled.   Tobacco abuse: Nicotine patch and cessation counseling given.   Acute renal failure: PROBABLY pre renal in origin. UA is negative. Urine electrolytes will be ordered. She was started on aggressive hydration, repeat renal parameters in am.    Subjective chills and myalgias: Influenza PCR is negative.    Code Status: full code.  Family Communication: multiple family members at bedside Disposition Plan: pending further work up.    Consultants: Thoracic surgery  Dr Prescott Gum Procedures:  none  Antibiotics:  IV vancomycin   IV zosyn  HPI/Subjective Feeling much better than yesterday.   Objective: Filed Vitals:   11/06/14 1450  BP: 117/69  Pulse: 108  Temp: 98.9 F (37.2 C)  Resp: 16    Intake/Output Summary (Last 24 hours) at 11/06/14 1748 Last data filed at 11/06/14 0700  Gross per 24 hour  Intake 2089.58 ml  Output    400 ml  Net 1689.58 ml   Filed Weights   11/05/14 1113  Weight: 77.111 kg (170 lb)    Exam:   General:  Alert afebrile not in distress on 3 lit  oxygen.   Cardiovascular: s1s2 tachycardic  Respiratory: scattered rhonchi, and wheezing heard  Abdomen: soft non tender non distended bowel sounds  heard  Musculoskeletal: trace pedal edema.   Data Reviewed: Basic Metabolic Panel:  Recent Labs Lab 11/05/14 1620  NA 138  K 3.5  CL 112  CO2 20  GLUCOSE 99  BUN 12  CREATININE 1.40*  CALCIUM 8.2*   Liver Function Tests:  Recent Labs Lab 11/05/14 1620  AST 14  ALT 23  ALKPHOS 56  BILITOT 0.4  PROT 6.7  ALBUMIN 2.9*   No results for input(s): LIPASE, AMYLASE in the last 168 hours. No results for input(s): AMMONIA in the last 168 hours. CBC:  Recent Labs Lab 11/05/14 1620  WBC 13.2*  NEUTROABS 7.8*  HGB 11.3*  HCT 33.6*  MCV 88.4  PLT 337   Cardiac Enzymes: No results for input(s): CKTOTAL, CKMB, CKMBINDEX, TROPONINI in the last 168 hours. BNP (last 3 results) No results for input(s): BNP in the last 8760 hours.  ProBNP (last 3 results) No results for input(s): PROBNP in the last 8760 hours.  CBG: No results for input(s): GLUCAP in the last 168 hours.  Recent Results (from the past 240 hour(s))  Culture, blood (routine x 2) Call MD if unable to obtain prior to antibiotics being given     Status: None (Preliminary result)   Collection Time: 11/05/14  4:20 PM  Result Value Ref Range Status   Specimen Description BLOOD LEFT ARM  Final   Special Requests BOTTLES DRAWN AEROBIC AND ANAEROBIC [place]  Final   Culture NO GROWTH 1 DAY  Final   Report Status PENDING  Incomplete  Culture, blood (routine x 2) Call MD if unable to obtain prior to antibiotics being given     Status: None (Preliminary result)   Collection Time: 11/05/14  6:52 PM  Result Value Ref Range Status   Specimen Description BLOOD LEFT ARM  Final   Special Requests BOTTLES DRAWN AEROBIC AND ANAEROBIC [place]  Final   Culture NO GROWTH 1 DAY  Final   Report Status PENDING  Incomplete     Studies: Dg Chest 2 View  11/05/2014   CLINICAL DATA:  Initial evaluation cough shortness of breath 1 week, smoking history  EXAM: CHEST  2 VIEW  COMPARISON:  05/30/2011  FINDINGS: Heart size and  vascular pattern are normal. Left lung is clear. There is abnormal masslike density in the right hilum, measuring about 5 x 5 cm.There is no pleural effusion.  IMPRESSION: Mass-like right perihilar density. Malignancy expected. Contrast-enhanced CT thorax recommended.   Electronically Signed   By: Skipper Cliche M.D.   On: 11/05/2014 12:05   Ct Chest W Contrast  11/05/2014   CLINICAL DATA:  Productive cough. Suspicious mass-like density in the right hilum on recent chest radiograph.  EXAM: CT CHEST WITH CONTRAST  TECHNIQUE: Multidetector CT imaging of the chest was performed during intravenous contrast administration.  CONTRAST:  69mL OMNIPAQUE IOHEXOL 300 MG/ML  SOLN  COMPARISON:  None.  FINDINGS: Thyroid tissues is diffusely enlarged and mildly heterogeneous. Isthmus measures 2 cm in thickness. Precarinal soft tissue measures 1.3 cm in the short axis on sequence 2, image 27. There is an upper right paratracheal lymph node that measures 0.9 cm in short axis on sequence 2, image 14. Tiny supraclavicular lymph nodes on both sides. There is no significant axillary lymphadenopathy. No significant pericardial or pleural fluid.  Trachea and mainstem bronchi are patent. There is an air-fluid collection in the central right lung which corresponds with the hilar mass on the recent chest radiograph. This lesion roughly measures 4.0 x 3.8 x 3.6 cm. There are small parenchymal densities in the right upper lobe with a tree-in-bud configuration. The central right lung lesion is located in the right upper lobe and bulging into the right minor fissure. Left lung is clear.  There is a large calcified gallstone measuring up to 2.5 cm. Normal appearance of the adrenal glands. No significant lymphadenopathy in the upper abdomen.  No acute bone abnormality.  IMPRESSION: There is a cavitary lesion in the central right upper lobe. This structure measures up to 4 cm and contains both air and fluid. Findings are concerning for a  pulmonary abscess. A cavitary lung neoplasm is also in the differential diagnosis. There are small lymph nodes in the mediastinum which are indeterminate.  Parenchymal densities in the right upper lobe with a tree-in-bud configuration. These parenchymal densities are suggestive for an infectious or inflammatory process.  Gallstone.  Thyromegaly of unknown etiology.  These results were called by telephone at the time of interpretation on 11/05/2014 at 3:40 pm to Dr. Alvino Chapel , who verbally acknowledged these results.   Electronically Signed   By: Markus Daft M.D.   On: 11/05/2014 15:41    Scheduled Meds: . feeding supplement (ENSURE COMPLETE)  237 mL Oral BID BM  . heparin  5,000 Units Subcutaneous 3 times per day  . hydrochlorothiazide  12.5 mg Oral Daily  . lisinopril  10 mg Oral Daily  . nicotine  21 mg Transdermal Daily  . piperacillin-tazobactam (ZOSYN)  IV  3.375 g Intravenous Q8H  . [START  ON 11/07/2014] pneumococcal 23 valent vaccine  0.5 mL Intramuscular Tomorrow-1000  . vancomycin  750 mg Intravenous Q12H   Continuous Infusions: . sodium chloride 125 mL/hr at 11/06/14 1210    Active Problems:   Community acquired pneumonia   Hypertension    Time spent: 25 minutes.     [place] [facility] Pager [phone]. If 7PM-7AM, please contact night-coverage at [email or web address], password [facility] 11/06/2014, 5:48 PM  LOS: 1 day

## 2014-11-06 NOTE — Progress Notes (Signed)
  Subjective: Thoracic surgery evaluation for recently diagnosed right upper lobe lung abscess. Patient examined, CT scan of chest reviewed.  Patient has been ill with a serious respiratory infection for the past 10-14 days. She is currently hospitalized for IV antibiotics-vancomycin and Zosyn-for right upper lobe 4 cm cavitary lung abscess. She has had a productive cough. She has had weight loss malaise and chest pain. No history of recent thoracic trauma. She smokes 1.5 packs per day. She has had no significant similar pulmonary infections in the past. Last chest x-ray in the system was 2011 which was clear. She had some dental extractions performed late 2015 for broken teeth but she did not have dental abscess disease. She denies any history of aspiration. Currently she is afebrile without mildly elevated white count.  Objective: Vital signs in last 24 hours: Temp:  [98 F (36.7 C)-98.9 F (37.2 C)] 98.9 F (37.2 C) (03/18 1450) Pulse Rate:  [83-108] 108 (03/18 1450) Cardiac Rhythm:  [-]  Resp:  [16-20] 16 (03/18 1450) BP: (107-135)/(42-87) 117/69 mmHg (03/18 1450) SpO2:  [96 %-100 %] 96 % (03/18 1450)  Hemodynamic parameters for last 24 hours:   stable  Intake/Output from previous day: 03/17 0701 - 03/18 0700 In: 2089.6 [I.V.:2089.6] Out: 400 [Urine:400] Intake/Output this shift:     General: Middle-aged Caucasian female no acute distress, anxious HEENT: Normocephalic pupils equal , dentition adequate Neck: Supple without JVD, adenopathy, or bruit Chest: Clear to auscultation, symmetrical breath sounds, no rhonchi, no tenderness             or deformity Cardiovascular: Regular rate and rhythm, no murmur, no gallop, peripheral pulses             palpable in all extremities Abdomen:  Soft, nontender, no palpable mass or organomegaly Extremities: Warm, well-perfused, no clubbing cyanosis edema or tenderness,              no venous stasis changes of the legs Rectal/GU:  Deferred Neuro: Grossly non--focal and symmetrical throughout Skin: Clean and dry without rash or ulceration   Lab Results:  Recent Labs  11/05/14 1620  WBC 13.2*  HGB 11.3*  HCT 33.6*  PLT 337   BMET:  Recent Labs  11/05/14 1620  NA 138  K 3.5  CL 112  CO2 20  GLUCOSE 99  BUN 12  CREATININE 1.40*  CALCIUM 8.2*    PT/INR: No results for input(s): LABPROT, INR in the last 72 hours. ABG No results found for: PHART, HCO3, TCO2, ACIDBASEDEF, O2SAT CBG (last 3)  No results for input(s): GLUCAP in the last 72 hours.  Assessment/Plan: Right upper lobe lung abscess. CT scan is concerning however on exam-however pulmonary findings are benign. Therapy for lung abscesses extended IV antibiotic therapy which has been initiated. Agree with choice of vancomycin and Zosyn. Patient would benefit from bronchoscopy which will be scheduled next week. I would  keep the patient on IV antibiotics for at least 10 days.   LOS: 1 day    Tharon Aquas Trigt III 11/06/2014

## 2014-11-06 NOTE — Progress Notes (Signed)
Nutrition Brief Note  Patient identified on the Malnutrition Screening Tool (MST) Report.  Wt Readings from Last 15 Encounters:  11/05/14 170 lb (77.111 kg)    Body mass index is 30.12 kg/(m^2). Patient meets criteria for class 1 obesity based on current BMI.   Patient reports usual weight of 200 lbs several years ago. She has lost weight gradually. Nutrition focused physical exam completed.  No muscle or subcutaneous fat depletion noticed. Weight loss is not significant.  Current diet order is heart healthy. Labs and medications reviewed.   No nutrition interventions warranted at this time. If nutrition issues arise, please consult RD.    Molli Barrows, RD, LDN, [place] Pager [phone] After Hours Pager [phone]

## 2014-11-07 ENCOUNTER — Inpatient Hospital Stay (HOSPITAL_COMMUNITY): Payer: Self-pay

## 2014-11-07 LAB — CBC
HCT: 34.9 % — ABNORMAL LOW (ref 36.0–46.0)
Hemoglobin: 11.5 g/dL — ABNORMAL LOW (ref 12.0–15.0)
MCH: 29.4 pg (ref 26.0–34.0)
MCHC: 33 g/dL (ref 30.0–36.0)
MCV: 89.3 fL (ref 78.0–100.0)
Platelets: 321 10*3/uL (ref 150–400)
RBC: 3.91 MIL/uL (ref 3.87–5.11)
RDW: 13.4 % (ref 11.5–15.5)
WBC: 19.6 10*3/uL — ABNORMAL HIGH (ref 4.0–10.5)

## 2014-11-07 LAB — BASIC METABOLIC PANEL
Anion gap: 8 (ref 5–15)
BUN: 9 mg/dL (ref 6–23)
CALCIUM: 8.2 mg/dL — AB (ref 8.4–10.5)
CHLORIDE: 108 mmol/L (ref 96–112)
CO2: 20 mmol/L (ref 19–32)
CREATININE: 1.53 mg/dL — AB (ref 0.50–1.10)
GFR calc non Af Amer: 42 mL/min — ABNORMAL LOW (ref >90)
GFR, EST AFRICAN AMERICAN: 49 mL/min — AB (ref >90)
GLUCOSE: 71 mg/dL (ref 70–99)
Potassium: 4.3 mmol/L (ref 3.5–5.1)
SODIUM: 136 mmol/L (ref 135–145)

## 2014-11-07 LAB — EXPECTORATED SPUTUM ASSESSMENT W REFEX TO RESP CULTURE

## 2014-11-07 LAB — EXPECTORATED SPUTUM ASSESSMENT W GRAM STAIN, RFLX TO RESP C

## 2014-11-07 LAB — HIV ANTIBODY (ROUTINE TESTING W REFLEX): HIV Screen 4th Generation wRfx: NONREACTIVE

## 2014-11-07 MED ORDER — ALBUTEROL SULFATE (2.5 MG/3ML) 0.083% IN NEBU
2.5000 mg | INHALATION_SOLUTION | RESPIRATORY_TRACT | Status: DC | PRN
  Administered 2014-11-08 (×3): 2.5 mg via RESPIRATORY_TRACT
  Filled 2014-11-07 (×3): qty 3

## 2014-11-07 MED ORDER — IPRATROPIUM-ALBUTEROL 0.5-2.5 (3) MG/3ML IN SOLN
3.0000 mL | RESPIRATORY_TRACT | Status: DC
  Administered 2014-11-07 (×3): 3 mL via RESPIRATORY_TRACT
  Filled 2014-11-07 (×3): qty 3

## 2014-11-07 MED ORDER — GUAIFENESIN 100 MG/5ML PO SYRP
200.0000 mg | ORAL_SOLUTION | ORAL | Status: DC | PRN
  Administered 2014-11-07 – 2014-11-12 (×11): 200 mg via ORAL
  Filled 2014-11-07 (×18): qty 10

## 2014-11-07 MED ORDER — IPRATROPIUM-ALBUTEROL 0.5-2.5 (3) MG/3ML IN SOLN
3.0000 mL | Freq: Three times a day (TID) | RESPIRATORY_TRACT | Status: DC
  Administered 2014-11-08 – 2014-11-12 (×11): 3 mL via RESPIRATORY_TRACT
  Filled 2014-11-07 (×15): qty 3

## 2014-11-07 NOTE — Progress Notes (Signed)
[facility] PROGRESS NOTE  Margaret Yoder [id number] DOB: April 19, 1976 DOA: 11/05/2014 PCP: No primary care provider on file. Interim summary: 39 year old lady admitted for subjective fevers and chills. She was found to have pneumonia on CXR and possible lung abscess on CT chest.  Assessment/Plan: CAP with possible lung abscess: She was initially started on broad spectrum antibiotics and blood cultures and sputum cultures ordered and pending.  She is requiring  3 liters of [place] oxygen to keep sats >90%.  Robitussin as needed.  Smoking cessation counseled.  Thoracic surgery consulted and recommendations given. Plan for bronchoscopy on Monday.  HIV antibody is pending.  Urine for legionella pending. and strep antigen is negative.  Influenza PCR is negative. .     Hypertension controlled.   Tobacco abuse: Nicotine patch and cessation counseling given.   Acute renal failure: PROBABLY pre renal in origin. UA is negative. Urine electrolytes done.  She was started on aggressive hydration, repeat renal parameters in am show persistent and worsening renal insufficiency. Get US renal.     Subjective chills and myalgias: Influenza PCR is negative.    Code Status: full code.  Family Communication: multiple family members at bedside Disposition Plan: pending further work up.    Consultants: Thoracic surgery  Dr Prescott Gum Procedures:  none  Antibiotics:  IV vancomycin   IV zosyn  HPI/Subjective Feeling much better than yesterday.  Cough is better.   Objective: Filed Vitals:   11/07/14 0618  BP: 141/71  Pulse: 83  Temp: 98.2 F (36.8 C)  Resp: 16    Intake/Output Summary (Last 24 hours) at 11/07/14 1902 Last data filed at 11/07/14 1700  Gross per 24 hour  Intake   2470 ml  Output      0 ml  Net   2470 ml   Filed Weights   11/05/14 1113  Weight: 77.111 kg (170 lb)    Exam:   General:  Alert afebrile not in distress on 3 lit [place] oxygen.    Cardiovascular: s1s2 tachycardic  Respiratory: scattered rhonchi, and wheezing heard  Abdomen: soft non tender non distended bowel sounds heard  Musculoskeletal: trace pedal edema.   Data Reviewed: Basic Metabolic Panel:  Recent Labs Lab 11/05/14 1620 11/07/14 0421  NA 138 136  K 3.5 4.3  CL 112 108  CO2 20 20  GLUCOSE 99 71  BUN 12 9  CREATININE 1.40* 1.53*  CALCIUM 8.2* 8.2*   Liver Function Tests:  Recent Labs Lab 11/05/14 1620  AST 14  ALT 23  ALKPHOS 56  BILITOT 0.4  PROT 6.7  ALBUMIN 2.9*   No results for input(s): LIPASE, AMYLASE in the last 168 hours. No results for input(s): AMMONIA in the last 168 hours. CBC:  Recent Labs Lab 11/05/14 1620 11/07/14 0421  WBC 13.2* 19.6*  NEUTROABS 7.8*  --   HGB 11.3* 11.5*  HCT 33.6* 34.9*  MCV 88.4 89.3  PLT 337 321   Cardiac Enzymes: No results for input(s): CKTOTAL, CKMB, CKMBINDEX, TROPONINI in the last 168 hours. BNP (last 3 results) No results for input(s): BNP in the last 8760 hours.  ProBNP (last 3 results) No results for input(s): PROBNP in the last 8760 hours.  CBG: No results for input(s): GLUCAP in the last 168 hours.  Recent Results (from the past 240 hour(s))  Culture, blood (routine x 2) Call MD if unable to obtain prior to antibiotics being given     Status: None (Preliminary result)   Collection  Time: 11/05/14  4:20 PM  Result Value Ref Range Status   Specimen Description BLOOD LEFT ARM  Final   Special Requests BOTTLES DRAWN AEROBIC AND ANAEROBIC 6CC EACH  Final   Culture NO GROWTH 2 DAYS  Final   Report Status PENDING  Incomplete  Culture, blood (routine x 2) Call MD if unable to obtain prior to antibiotics being given     Status: None (Preliminary result)   Collection Time: 11/05/14  6:52 PM  Result Value Ref Range Status   Specimen Description BLOOD LEFT ARM  Final   Special Requests BOTTLES DRAWN AEROBIC AND ANAEROBIC [place]  Final   Culture NO GROWTH 2 DAYS  Final    Report Status PENDING  Incomplete  Culture, sputum-assessment     Status: None   Collection Time: 11/06/14  2:58 PM  Result Value Ref Range Status   Specimen Description SPUTUM  Final   Special Requests NONE  Final   Sputum evaluation   Final    MICROSCOPIC FINDINGS SUGGEST THAT THIS SPECIMEN IS NOT REPRESENTATIVE OF LOWER RESPIRATORY SECRETIONS. PLEASE RECOLLECT. RESULT CALLED TO, READ BACK BY AND VERIFIED WITHLoleta Rose RN [phone] GREEN R    Report Status 11/06/2014 FINAL  Final  Culture, expectorated sputum-assessment     Status: None   Collection Time: 11/07/14  2:56 AM  Result Value Ref Range Status   Specimen Description SPU  Final   Special Requests NONE  Final   Sputum evaluation   Final    MICROSCOPIC FINDINGS SUGGEST THAT THIS SPECIMEN IS NOT REPRESENTATIVE OF LOWER RESPIRATORY SECRETIONS. PLEASE RECOLLECT. RESULT CALLED TO, READ BACK BY AND VERIFIED WITH: Nat Christen RN [id number] [phone] GREEN R    Report Status 11/07/2014 FINAL  Final     Studies: No results found.  Scheduled Meds: . feeding supplement (ENSURE COMPLETE)  237 mL Oral BID BM  . heparin  5,000 Units Subcutaneous 3 times per day  . hydrochlorothiazide  12.5 mg Oral Daily  . ipratropium-albuterol  3 mL Nebulization Q4H  . lisinopril  10 mg Oral Daily  . nicotine  21 mg Transdermal Daily  . piperacillin-tazobactam (ZOSYN)  IV  3.375 g Intravenous Q8H  . pneumococcal 23 valent vaccine  0.5 mL Intramuscular Tomorrow-1000  . vancomycin  750 mg Intravenous Q12H   Continuous Infusions: . sodium chloride 1 mL (11/07/14 0427)    Active Problems:   Community acquired pneumonia   Hypertension    Time spent: 25 minutes.     [place] [facility] Pager [phone]. If 7PM-7AM, please contact night-coverage at [email or web address], password [facility] 11/07/2014, 7:02 PM  LOS: 2 days

## 2014-11-08 ENCOUNTER — Inpatient Hospital Stay (HOSPITAL_COMMUNITY): Payer: Self-pay

## 2014-11-08 DIAGNOSIS — D649 Anemia, unspecified: Secondary | ICD-10-CM | POA: Diagnosis present

## 2014-11-08 DIAGNOSIS — D72829 Elevated white blood cell count, unspecified: Secondary | ICD-10-CM | POA: Diagnosis present

## 2014-11-08 DIAGNOSIS — N179 Acute kidney failure, unspecified: Secondary | ICD-10-CM | POA: Diagnosis present

## 2014-11-08 DIAGNOSIS — N189 Chronic kidney disease, unspecified: Secondary | ICD-10-CM | POA: Diagnosis present

## 2014-11-08 DIAGNOSIS — A419 Sepsis, unspecified organism: Secondary | ICD-10-CM | POA: Diagnosis present

## 2014-11-08 LAB — BASIC METABOLIC PANEL
Anion gap: 11 (ref 5–15)
BUN: 6 mg/dL (ref 6–23)
CO2: 18 mmol/L — ABNORMAL LOW (ref 19–32)
Calcium: 8 mg/dL — ABNORMAL LOW (ref 8.4–10.5)
Chloride: 107 mmol/L (ref 96–112)
Creatinine, Ser: 1.36 mg/dL — ABNORMAL HIGH (ref 0.50–1.10)
GFR calc Af Amer: 56 mL/min — ABNORMAL LOW (ref >90)
GFR calc non Af Amer: 48 mL/min — ABNORMAL LOW (ref >90)
GLUCOSE: 110 mg/dL — AB (ref 70–99)
POTASSIUM: 3.6 mmol/L (ref 3.5–5.1)
SODIUM: 136 mmol/L (ref 135–145)

## 2014-11-08 LAB — CBC
HCT: 29.9 % — ABNORMAL LOW (ref 36.0–46.0)
HEMOGLOBIN: 10.1 g/dL — AB (ref 12.0–15.0)
MCH: 29.8 pg (ref 26.0–34.0)
MCHC: 33.8 g/dL (ref 30.0–36.0)
MCV: 88.2 fL (ref 78.0–100.0)
Platelets: 246 10*3/uL (ref 150–400)
RBC: 3.39 MIL/uL — ABNORMAL LOW (ref 3.87–5.11)
RDW: 13.5 % (ref 11.5–15.5)
WBC: 20.1 10*3/uL — ABNORMAL HIGH (ref 4.0–10.5)

## 2014-11-08 LAB — RETICULOCYTES
RBC.: 3.28 MIL/uL — AB (ref 3.87–5.11)
RETIC COUNT ABSOLUTE: 32.8 10*3/uL (ref 19.0–186.0)
Retic Ct Pct: 1 % (ref 0.4–3.1)

## 2014-11-08 LAB — EXPECTORATED SPUTUM ASSESSMENT W REFEX TO RESP CULTURE

## 2014-11-08 LAB — EXPECTORATED SPUTUM ASSESSMENT W GRAM STAIN, RFLX TO RESP C

## 2014-11-08 LAB — LACTIC ACID, PLASMA: LACTIC ACID, VENOUS: 1.1 mmol/L (ref 0.5–2.0)

## 2014-11-08 LAB — VANCOMYCIN, TROUGH: Vancomycin Tr: 16.6 ug/mL (ref 10.0–20.0)

## 2014-11-08 LAB — PROCALCITONIN: PROCALCITONIN: 0.31 ng/mL

## 2014-11-08 MED ORDER — MORPHINE SULFATE 2 MG/ML IJ SOLN
1.0000 mg | INTRAMUSCULAR | Status: DC | PRN
  Administered 2014-11-08 – 2014-11-12 (×17): 2 mg via INTRAVENOUS
  Filled 2014-11-08 (×18): qty 1

## 2014-11-08 MED ORDER — BENZONATATE 100 MG PO CAPS
200.0000 mg | ORAL_CAPSULE | Freq: Three times a day (TID) | ORAL | Status: DC | PRN
  Administered 2014-11-08 – 2014-11-12 (×12): 200 mg via ORAL
  Filled 2014-11-08 (×12): qty 2

## 2014-11-08 NOTE — Progress Notes (Signed)
ANTIBIOTIC CONSULT NOTE  Pharmacy Consult for Vancomycin & Zosyn Indication: pneumonia  No Known Allergies  Patient Measurements: Height: 5\' 3"  (160 cm) Weight: 170 lb (77.111 kg) IBW/kg (Calculated) : 52.4  Vital Signs: Temp: 100.2 F (37.9 C) (03/20 0519) Temp Source: Oral (03/20 0519) BP: 130/67 mmHg (03/20 0519) Pulse Rate: 109 (03/20 0519) Intake/Output from previous day: 03/19 0701 - 03/20 0700 In: 4791.7 [P.O.:1400; I.V.:2841.7; IV Piggyback:550] Out: -  Intake/Output from this shift:    Labs:  Recent Labs  11/05/14 1620 11/06/14 1538 11/07/14 0421  WBC 13.2*  --  19.6*  HGB 11.3*  --  11.5*  PLT 337  --  321  LABCREA  --  43.66  --   CREATININE 1.40*  --  1.53*   Estimated Creatinine Clearance: 48.6 mL/min (by C-G formula based on Cr of 1.53).  Recent Labs  11/08/14 0724  VANCOTROUGH 16.6     Microbiology: Recent Results (from the past 720 hour(s))  Culture, blood (routine x 2) Call MD if unable to obtain prior to antibiotics being given     Status: None (Preliminary result)   Collection Time: 11/05/14  4:20 PM  Result Value Ref Range Status   Specimen Description BLOOD LEFT ARM  Final   Special Requests BOTTLES DRAWN AEROBIC AND ANAEROBIC 6CC EACH  Final   Culture NO GROWTH 2 DAYS  Final   Report Status PENDING  Incomplete  Culture, blood (routine x 2) Call MD if unable to obtain prior to antibiotics being given     Status: None (Preliminary result)   Collection Time: 11/05/14  6:52 PM  Result Value Ref Range Status   Specimen Description BLOOD LEFT ARM  Final   Special Requests BOTTLES DRAWN AEROBIC AND ANAEROBIC 6CC EACH  Final   Culture NO GROWTH 2 DAYS  Final   Report Status PENDING  Incomplete  Culture, sputum-assessment     Status: None   Collection Time: 11/06/14  2:58 PM  Result Value Ref Range Status   Specimen Description SPUTUM  Final   Special Requests NONE  Final   Sputum evaluation   Final    MICROSCOPIC FINDINGS SUGGEST THAT  THIS SPECIMEN IS NOT REPRESENTATIVE OF LOWER RESPIRATORY SECRETIONS. PLEASE RECOLLECT. RESULT CALLED TO, READ BACK BY AND VERIFIED WITHLoleta Rose RN [phone] GREEN R    Report Status 11/06/2014 FINAL  Final  Culture, expectorated sputum-assessment     Status: None   Collection Time: 11/07/14  2:56 AM  Result Value Ref Range Status   Specimen Description SPU  Final   Special Requests NONE  Final   Sputum evaluation   Final    MICROSCOPIC FINDINGS SUGGEST THAT THIS SPECIMEN IS NOT REPRESENTATIVE OF LOWER RESPIRATORY SECRETIONS. PLEASE RECOLLECT. RESULT CALLED TO, READ BACK BY AND VERIFIED WITH: Nat Christen RN [id number] [phone] GREEN R    Report Status 11/07/2014 FINAL  Final  Culture, expectorated sputum-assessment     Status: None   Collection Time: 11/08/14  6:00 AM  Result Value Ref Range Status   Specimen Description SPUTUM  Final   Special Requests NONE  Final   Sputum evaluation   Final    THIS SPECIMEN IS ACCEPTABLE. RESPIRATORY CULTURE REPORT TO FOLLOW.   Report Status 11/08/2014 FINAL  Final    Anti-infectives    Start     Dose/Rate Route Frequency Ordered Stop   11/06/14 0400  vancomycin (VANCOCIN) IVPB 750 mg/150 ml premix     750 mg 150 mL/hr over  60 Minutes Intravenous Every 12 hours 11/05/14 2224     11/05/14 1830  piperacillin-tazobactam (ZOSYN) IVPB 3.375 g     3.375 g 12.5 mL/hr over 240 Minutes Intravenous Every 8 hours 11/05/14 1823     11/05/14 1615  vancomycin (VANCOCIN) IVPB 1000 mg/200 mL premix     1,000 mg 200 mL/hr over 60 Minutes Intravenous  Once 11/05/14 1604 11/05/14 1715      Assessment: 15 yoF who presented with productive cough & fever x1 week. Chest CT + cavitary lesion, abscess.  She is on day # 4 of vancomycin and zosyn for RUL lung abscess. Scr 1.4>1.53. Est creat cl ~ 56 ml/min.  WBC 13.2>19.6.  Temp 100.2 last 2 checks.  Vancomycin trough drawn this morning at 0724 is therapeutic at 16.6 mcg/ml after 4 doses of vancomycin 750 mg IV q12h and a  1 gm loading dose. TCTS consulted and plan bronchoscopy soon/Monday and at least 10 days IV abx.   Zosyn 3/17>> Vancomycin 3/17>>  3/20 VT = 16.6 on 750 q12  3/20 sputum>> 3/17 BCx x2>> ngtd 3/18 & 3/19 sputum samples not acceptable Flu neg HIV neg   Goal of Therapy:  Vancomycin trough level 15-20 mcg/ml  Eradicate infection.  Plan:  Continue Zosyn 3.375gm IV Q8h to be infused over 4hrs Continue vancomycin 750mg  IV q12h Consider change Zosyn to Rocephin + Zithromax for better coverage of community-acquired pathogens (legionella coverage) Eudelia Bunch, Pharm.D. [id number] 11/08/2014 9:36 AM

## 2014-11-08 NOTE — Progress Notes (Addendum)
[facility] PROGRESS NOTE  REATA WESTRICK [id number] DOB: 1975-11-12 DOA: 11/05/2014 PCP: No primary care provider on file. Interim summary: 39 year old lady admitted for subjective fevers and chills. She was found to have pneumonia on CXR and possible lung abscess on CT chest.  Assessment/Plan: Sepsis from CAP with possible lung abscess: She was initially started on broad spectrum antibiotics and blood cultures and sputum cultures ordered and pending.  Initially she required abotu 3 liters of oxygen and we were able to wean her off the oxygen today.  Robitussin as needed.  Smoking cessation counseled.  Thoracic surgery consulted and recommendations given. Plan for bronchoscopy on Monday.  HIV antibody is negative Urine for legionella pending. and strep antigen is negative.  Influenza PCR is negative. .  Lactic acid and procalcitonin levels ordered     Hypertension controlled.   Tobacco abuse: Nicotine patch and cessation counseling given.   Acute renal failure:  UA is negative. Urine electrolytes done.  Fena is greater than 2, probably from Infection and ATN.  repeat renal parameters in am show improvement. US renal does not show any hydronephrosis.    Subjective chills and myalgias: Influenza PCR is negative. Improved.    Anemia: Mild normocytic. Anemia panel will be sent.   Fever: repeat CXR ordered.    Code Status: full code.  Family Communication: multiple family members at bedside Disposition Plan: pending further work up.    Consultants: Thoracic surgery  Dr Prescott Gum Procedures:  none  Antibiotics:  IV vancomycin   IV zosyn  HPI/Subjective Had fever this am.  Objective: Filed Vitals:   11/08/14 1421  BP: 136/72  Pulse: 106  Temp: 100.4 F (38 C)  Resp: 16    Intake/Output Summary (Last 24 hours) at 11/08/14 1516 Last data filed at 11/08/14 1425  Gross per 24 hour  Intake 4251.67 ml  Output      0 ml  Net 4251.67 ml    Filed Weights   11/05/14 1113  Weight: 77.111 kg (170 lb)    Exam:   General:  Alert not in distress, but febrile.  Cardiovascular: s1s2 tachycardic  Respiratory: scattered rhonchi, no wheezing heard.   Abdomen: soft non tender non distended bowel sounds heard  Musculoskeletal: trace pedal edema.   Data Reviewed: Basic Metabolic Panel:  Recent Labs Lab 11/05/14 1620 11/07/14 0421 11/08/14 1225  NA 138 136 136  K 3.5 4.3 3.6  CL 112 108 107  CO2 20 20 18*  GLUCOSE 99 71 110*  BUN 12 9 6   CREATININE 1.40* 1.53* 1.36*  CALCIUM 8.2* 8.2* 8.0*   Liver Function Tests:  Recent Labs Lab 11/05/14 1620  AST 14  ALT 23  ALKPHOS 56  BILITOT 0.4  PROT 6.7  ALBUMIN 2.9*   No results for input(s): LIPASE, AMYLASE in the last 168 hours. No results for input(s): AMMONIA in the last 168 hours. CBC:  Recent Labs Lab 11/05/14 1620 11/07/14 0421 11/08/14 1225  WBC 13.2* 19.6* 20.1*  NEUTROABS 7.8*  --   --   HGB 11.3* 11.5* 10.1*  HCT 33.6* 34.9* 29.9*  MCV 88.4 89.3 88.2  PLT 337 321 246   Cardiac Enzymes: No results for input(s): CKTOTAL, CKMB, CKMBINDEX, TROPONINI in the last 168 hours. BNP (last 3 results) No results for input(s): BNP in the last 8760 hours.  ProBNP (last 3 results) No results for input(s): PROBNP in the last 8760 hours.  CBG: No results for input(s): GLUCAP in the last  168 hours.  Recent Results (from the past 240 hour(s))  Culture, blood (routine x 2) Call MD if unable to obtain prior to antibiotics being given     Status: None (Preliminary result)   Collection Time: 11/05/14  4:20 PM  Result Value Ref Range Status   Specimen Description BLOOD LEFT ARM  Final   Special Requests BOTTLES DRAWN AEROBIC AND ANAEROBIC 6CC EACH  Final   Culture NO GROWTH 3 DAYS  Final   Report Status PENDING  Incomplete  Culture, blood (routine x 2) Call MD if unable to obtain prior to antibiotics being given     Status: None (Preliminary result)    Collection Time: 11/05/14  6:52 PM  Result Value Ref Range Status   Specimen Description BLOOD LEFT ARM  Final   Special Requests BOTTLES DRAWN AEROBIC AND ANAEROBIC [place]  Final   Culture NO GROWTH 3 DAYS  Final   Report Status PENDING  Incomplete  Culture, sputum-assessment     Status: None   Collection Time: 11/06/14  2:58 PM  Result Value Ref Range Status   Specimen Description SPUTUM  Final   Special Requests NONE  Final   Sputum evaluation   Final    MICROSCOPIC FINDINGS SUGGEST THAT THIS SPECIMEN IS NOT REPRESENTATIVE OF LOWER RESPIRATORY SECRETIONS. PLEASE RECOLLECT. RESULT CALLED TO, READ BACK BY AND VERIFIED WITHLoleta Rose RN [phone] GREEN R    Report Status 11/06/2014 FINAL  Final  Culture, expectorated sputum-assessment     Status: None   Collection Time: 11/07/14  2:56 AM  Result Value Ref Range Status   Specimen Description SPU  Final   Special Requests NONE  Final   Sputum evaluation   Final    MICROSCOPIC FINDINGS SUGGEST THAT THIS SPECIMEN IS NOT REPRESENTATIVE OF LOWER RESPIRATORY SECRETIONS. PLEASE RECOLLECT. RESULT CALLED TO, READ BACK BY AND VERIFIED WITH: Nat Christen RN [id number] [phone] GREEN R    Report Status 11/07/2014 FINAL  Final  Culture, expectorated sputum-assessment     Status: None   Collection Time: 11/08/14  6:00 AM  Result Value Ref Range Status   Specimen Description SPUTUM  Final   Special Requests NONE  Final   Sputum evaluation   Final    THIS SPECIMEN IS ACCEPTABLE. RESPIRATORY CULTURE REPORT TO FOLLOW.   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 No hydronephrosis is evident. The kidney measures 11.4 cm longitudinal. No hydronephrosis is evident. There are unremarkable appearances of the urinary bladder.   IMPRESSION: Crossed fused renal ectopia.  No hydronephrosis.   Electronically Signed   By: Andreas Newport M.D.   On: 11/07/2014 23:49    Scheduled Meds: . feeding supplement (ENSURE COMPLETE)  237 mL Oral BID BM  . heparin  5,000 Units Subcutaneous 3 times per day  . hydrochlorothiazide  12.5 mg Oral Daily  . ipratropium-albuterol  3 mL Nebulization TID  . lisinopril  10 mg Oral Daily  . nicotine  21 mg Transdermal Daily  . piperacillin-tazobactam (ZOSYN)  IV  3.375 g Intravenous Q8H  . pneumococcal 23 valent vaccine  0.5 mL Intramuscular Tomorrow-1000  . vancomycin  750 mg Intravenous Q12H   Continuous Infusions: . sodium chloride 1 mL (11/08/14 0544)  Active Problems:   Community acquired pneumonia   Hypertension    Time spent: 25 minutes.     [facility] Pager [phone]. If 7PM-7AM, please contact night-coverage at [email or web address], password [facility] 11/08/2014, 3:16 PM  LOS: 3 days

## 2014-11-09 DIAGNOSIS — N179 Acute kidney failure, unspecified: Secondary | ICD-10-CM

## 2014-11-09 LAB — CBC
HEMATOCRIT: 27 % — AB (ref 36.0–46.0)
Hemoglobin: 9.2 g/dL — ABNORMAL LOW (ref 12.0–15.0)
MCH: 29.8 pg (ref 26.0–34.0)
MCHC: 34.1 g/dL (ref 30.0–36.0)
MCV: 87.4 fL (ref 78.0–100.0)
Platelets: 218 10*3/uL (ref 150–400)
RBC: 3.09 MIL/uL — AB (ref 3.87–5.11)
RDW: 13.6 % (ref 11.5–15.5)
WBC: 14.4 10*3/uL — AB (ref 4.0–10.5)

## 2014-11-09 LAB — IRON AND TIBC
Iron: <10 ug/dL — ABNORMAL LOW (ref 42–145)
UIBC: 146 ug/dL (ref 125–400)

## 2014-11-09 LAB — FOLATE: Folate: 12.8 ng/mL

## 2014-11-09 LAB — BASIC METABOLIC PANEL
ANION GAP: 8 (ref 5–15)
CALCIUM: 8 mg/dL — AB (ref 8.4–10.5)
CHLORIDE: 106 mmol/L (ref 96–112)
CO2: 22 mmol/L (ref 19–32)
Creatinine, Ser: 1.4 mg/dL — ABNORMAL HIGH (ref 0.50–1.10)
GFR calc Af Amer: 54 mL/min — ABNORMAL LOW (ref >90)
GFR calc non Af Amer: 47 mL/min — ABNORMAL LOW (ref >90)
GLUCOSE: 117 mg/dL — AB (ref 70–99)
Potassium: 3.3 mmol/L — ABNORMAL LOW (ref 3.5–5.1)
Sodium: 136 mmol/L (ref 135–145)

## 2014-11-09 LAB — VITAMIN B12: Vitamin B-12: 629 pg/mL (ref 211–911)

## 2014-11-09 LAB — LEGIONELLA ANTIGEN, URINE

## 2014-11-09 LAB — FERRITIN: Ferritin: 335 ng/mL — ABNORMAL HIGH (ref 10–291)

## 2014-11-09 MED ORDER — DEXTROSE 5 % IV SOLN
500.0000 mg | INTRAVENOUS | Status: DC
  Administered 2014-11-09 – 2014-11-10 (×2): 500 mg via INTRAVENOUS
  Filled 2014-11-09 (×3): qty 500

## 2014-11-09 MED ORDER — POTASSIUM CHLORIDE 10 MEQ/100ML IV SOLN
10.0000 meq | INTRAVENOUS | Status: AC
  Administered 2014-11-09 (×2): 10 meq via INTRAVENOUS
  Filled 2014-11-09 (×2): qty 100

## 2014-11-09 MED ORDER — POTASSIUM CHLORIDE CRYS ER 20 MEQ PO TBCR
40.0000 meq | EXTENDED_RELEASE_TABLET | Freq: Two times a day (BID) | ORAL | Status: DC
  Administered 2014-11-09: 40 meq via ORAL
  Filled 2014-11-09: qty 2

## 2014-11-09 NOTE — Progress Notes (Signed)
[facility] PROGRESS NOTE  Margaret Yoder [id number] DOB: [birth year] DOA: 11/05/2014 PCP: No primary care provider on file. Interim summary: 39 year old lady admitted for subjective fevers and chills. She was found to have pneumonia on CXR and possible lung abscess on CT chest.  Assessment/Plan: Sepsis from CAP with possible lung abscess: She was initially started on broad spectrum antibiotics  And zithromax added on 3/21 for atypical coverage and blood cultures and sputum cultures ordered and pending.  Initially she required abotu 3 liters of oxygen and we were able to wean her off the oxygen . She is on RA. Robitussin as needed.  Smoking cessation counseled.  Thoracic surgery consulted and recommendations given. Plan for bronchoscopy on Wednesday. HIV antibody is negative Urine for legionella pending. and strep antigen is negative.  Influenza PCR is negative. .  Lactic acid and procalcitonin levels are all negative.     Hypertension controlled.   Tobacco abuse: Nicotine patch and cessation counseling given.   Acute renal failure on CKD stage 1:  UA is negative. Urine electrolytes done.  Fena is greater than 2, probably from Infection and ATN.  repeat renal parameters in am show improvement. US renal does not show any hydronephrosis. On talking to the patient she reported that she has renal issues one year back.    Subjective chills and myalgias: Influenza PCR is negative. Improved.    Anemia: Mild normocytic. Anemia panel sent showed low iron  Levels. Iron replacement ordered.   Fever: repeat CXR ordered showed enlarging cavitary process on the right.  Plan for bronchoscopy on Wednesday.   Hypokalemia: Replete as needed.    Code Status: full code.  Family Communication: family members at bedside Disposition Plan: pending further work up.    Consultants: Thoracic surgery  Dr Prescott Gum Procedures:  none  Antibiotics:  IV vancomycin   IV  zosyn  HPI/Subjective Feeling a little better.  Objective: Filed Vitals:   11/09/14 1500  BP: 118/67  Pulse: 102  Temp: 98.9 F (37.2 C)  Resp: 18    Intake/Output Summary (Last 24 hours) at 11/09/14 1933 Last data filed at 11/09/14 1425  Gross per 24 hour  Intake   1040 ml  Output      0 ml  Net   1040 ml   Filed Weights   11/05/14 1113  Weight: 77.111 kg (170 lb)    Exam:   General:  Alert not in distress, .   Cardiovascular: s1s2 tachycardic  Respiratory: scattered rhonchi, no wheezing heard.   Abdomen: soft non tender non distended bowel sounds heard  Musculoskeletal: trace pedal edema.   Data Reviewed: Basic Metabolic Panel:  Recent Labs Lab 11/05/14 1620 11/07/14 0421 11/08/14 1225 11/09/14 0515  NA 138 136 136 136  K 3.5 4.3 3.6 3.3*  CL 112 108 107 106  CO2 20 20 18* 22  GLUCOSE 99 71 110* 117*  BUN 12 9 6  <5*  CREATININE 1.40* 1.53* 1.36* 1.40*  CALCIUM 8.2* 8.2* 8.0* 8.0*   Liver Function Tests:  Recent Labs Lab 11/05/14 1620  AST 14  ALT 23  ALKPHOS 56  BILITOT 0.4  PROT 6.7  ALBUMIN 2.9*   No results for input(s): LIPASE, AMYLASE in the last 168 hours. No results for input(s): AMMONIA in the last 168 hours. CBC:  Recent Labs Lab 11/05/14 1620 11/07/14 0421 11/08/14 1225 11/09/14 0515  WBC 13.2* 19.6* 20.1* 14.4*  NEUTROABS 7.8*  --   --   --  HGB 11.3* 11.5* 10.1* 9.2*  HCT 33.6* 34.9* 29.9* 27.0*  MCV 88.4 89.3 88.2 87.4  PLT 337 321 246 218   Cardiac Enzymes: No results for input(s): CKTOTAL, CKMB, CKMBINDEX, TROPONINI in the last 168 hours. BNP (last 3 results) No results for input(s): BNP in the last 8760 hours.  ProBNP (last 3 results) No results for input(s): PROBNP in the last 8760 hours.  CBG: No results for input(s): GLUCAP in the last 168 hours.  Recent Results (from the past 240 hour(s))  Culture, blood (routine x 2) Call MD if unable to obtain prior to antibiotics being given     Status: None  (Preliminary result)   Collection Time: 11/05/14  4:20 PM  Result Value Ref Range Status   Specimen Description BLOOD LEFT ARM  Final   Special Requests BOTTLES DRAWN AEROBIC AND ANAEROBIC 6CC EACH  Final   Culture NO GROWTH 4 DAYS  Final   Report Status PENDING  Incomplete  Culture, blood (routine x 2) Call MD if unable to obtain prior to antibiotics being given     Status: None (Preliminary result)   Collection Time: 11/05/14  6:52 PM  Result Value Ref Range Status   Specimen Description BLOOD LEFT ARM  Final   Special Requests BOTTLES DRAWN AEROBIC AND ANAEROBIC [place]  Final   Culture NO GROWTH 4 DAYS  Final   Report Status PENDING  Incomplete  Culture, sputum-assessment     Status: None   Collection Time: 11/06/14  2:58 PM  Result Value Ref Range Status   Specimen Description SPUTUM  Final   Special Requests NONE  Final   Sputum evaluation   Final    MICROSCOPIC FINDINGS SUGGEST THAT THIS SPECIMEN IS NOT REPRESENTATIVE OF LOWER RESPIRATORY SECRETIONS. PLEASE RECOLLECT. RESULT CALLED TO, READ BACK BY AND VERIFIED WITHLoleta Rose RN [phone] GREEN R    Report Status 11/06/2014 FINAL  Final  Culture, expectorated sputum-assessment     Status: None   Collection Time: 11/07/14  2:56 AM  Result Value Ref Range Status   Specimen Description SPU  Final   Special Requests NONE  Final   Sputum evaluation   Final    MICROSCOPIC FINDINGS SUGGEST THAT THIS SPECIMEN IS NOT REPRESENTATIVE OF LOWER RESPIRATORY SECRETIONS. PLEASE RECOLLECT. RESULT CALLED TO, READ BACK BY AND VERIFIED WITH: Nat Christen RN [id number] [phone] GREEN R    Report Status 11/07/2014 FINAL  Final  Culture, respiratory (NON-Expectorated)     Status: None (Preliminary result)   Collection Time: 11/08/14  5:48 AM  Result Value Ref Range Status   Specimen Description SPUTUM  Final   Special Requests NONE  Final   Gram Stain   Final    FEW WBC PRESENT,BOTH PMN AND MONONUCLEAR FEW SQUAMOUS EPITHELIAL CELLS PRESENT RARE  GRAM POSITIVE COCCI IN PAIRS RARE GRAM NEGATIVE RODS RARE YEAST    Culture   Final    Culture reincubated for better growth Performed at [facility]    Report Status PENDING  Incomplete  Culture, expectorated sputum-assessment     Status: None   Collection Time: 11/08/14  6:00 AM  Result Value Ref Range Status   Specimen Description SPUTUM  Final   Special Requests NONE  Final   Sputum evaluation   Final    THIS SPECIMEN IS ACCEPTABLE. RESPIRATORY CULTURE REPORT TO FOLLOW.   Report Status 11/08/2014 FINAL  Final     Studies: Dg Chest 2 View  11/08/2014   CLINICAL  DATA:  Initial evaluation for fever shortness of breath cough pneumonia for 3 days, patient smokes with history of hypertension  EXAM: CHEST  2 VIEW  COMPARISON:  11/05/2014  FINDINGS: Abnormal rounded opacity right middle and upper lobe with air-fluid level. It measures 8 x 8 cm which is larger than on the recent chest CT. No significant pleural effusion. Left lung remains clear and heart size is normal.  IMPRESSION: Enlarging cavitary consolidative process on the right.   Electronically Signed   By: Skipper Cliche M.D.   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 No hydronephrosis is evident. The kidney measures 11.4 cm longitudinal. No hydronephrosis is evident. There are unremarkable appearances of the urinary bladder.  IMPRESSION: Crossed fused renal ectopia.  No hydronephrosis.   Electronically Signed   By: Andreas Newport M.D.   On: 11/07/2014 23:49    Scheduled Meds: . azithromycin  500 mg Intravenous Q24H  . feeding supplement (ENSURE COMPLETE)  237 mL Oral BID BM  . heparin  5,000 Units Subcutaneous 3 times per day  . hydrochlorothiazide  12.5 mg Oral Daily  . ipratropium-albuterol  3 mL Nebulization  TID  . lisinopril  10 mg Oral Daily  . nicotine  21 mg Transdermal Daily  . piperacillin-tazobactam (ZOSYN)  IV  3.375 g Intravenous Q8H  . pneumococcal 23 valent vaccine  0.5 mL Intramuscular Tomorrow-1000  . vancomycin  750 mg Intravenous Q12H   Continuous Infusions: . sodium chloride 50 mL/hr at 11/09/14 1100    Active Problems:   Community acquired pneumonia   Hypertension   Acute renal failure   Sepsis   Leukocytosis   Anemia    Time spent: 25 minutes.     [facility] Pager [phone]. If 7PM-7AM, please contact night-coverage at [email or web address], password [facility] 11/09/2014, 7:33 PM  LOS: 4 days

## 2014-11-09 NOTE — Progress Notes (Signed)
Procedure(s) (LRB): VIDEO BRONCHOSCOPY (N/A) R lung abscess Cont iv antibiotics Plan bronch wed am  Objective: Vital signs in last 24 hours: Temp:  [98.9 F (37.2 C)-99.9 F (37.7 C)] 98.9 F (37.2 C) (03/21 1500) Pulse Rate:  [102-105] 102 (03/21 1500) Cardiac Rhythm:  [-]  Resp:  [17-19] 18 (03/21 1500) BP: (108-118)/(56-73) 118/67 mmHg (03/21 1500) SpO2:  [95 %-100 %] 95 % (03/21 1500)  Hemodynamic parameters for last 24 hours:    Intake/Output from previous day: 03/20 0701 - 03/21 0700 In: 1040 [P.O.:240; I.V.:800] Out: -  Intake/Output this shift: Total I/O In: 240 [P.O.:240] Out: -     Lab Results:  Recent Labs  11/08/14 1225 11/09/14 0515  WBC 20.1* 14.4*  HGB 10.1* 9.2*  HCT 29.9* 27.0*  PLT 246 218   BMET:  Recent Labs  11/08/14 1225 11/09/14 0515  NA 136 136  K 3.6 3.3*  CL 107 106  CO2 18* 22  GLUCOSE 110* 117*  BUN 6 <5*  CREATININE 1.36* 1.40*  CALCIUM 8.0* 8.0*    PT/INR: No results for input(s): LABPROT, INR in the last 72 hours. ABG No results found for: PHART, HCO3, TCO2, ACIDBASEDEF, O2SAT CBG (last 3)  No results for input(s): GLUCAP in the last 72 hours.  Assessment/Plan: S/P Procedure(s) (LRB): VIDEO BRONCHOSCOPY (N/A) broncoscopy under general anesthesia wed am   LOS: 4 days    Tharon Aquas Trigt III 11/09/2014

## 2014-11-10 ENCOUNTER — Inpatient Hospital Stay (HOSPITAL_COMMUNITY): Payer: Self-pay

## 2014-11-10 DIAGNOSIS — J852 Abscess of lung without pneumonia: Secondary | ICD-10-CM | POA: Diagnosis present

## 2014-11-10 LAB — BASIC METABOLIC PANEL
Anion gap: 9 (ref 5–15)
BUN: 6 mg/dL (ref 6–23)
CO2: 25 mmol/L (ref 19–32)
CREATININE: 1.49 mg/dL — AB (ref 0.50–1.10)
Calcium: 8.2 mg/dL — ABNORMAL LOW (ref 8.4–10.5)
Chloride: 106 mmol/L (ref 96–112)
GFR calc Af Amer: 50 mL/min — ABNORMAL LOW (ref >90)
GFR, EST NON AFRICAN AMERICAN: 43 mL/min — AB (ref >90)
Glucose, Bld: 86 mg/dL (ref 70–99)
Potassium: 3.4 mmol/L — ABNORMAL LOW (ref 3.5–5.1)
Sodium: 140 mmol/L (ref 135–145)

## 2014-11-10 LAB — CULTURE, BLOOD (ROUTINE X 2)
CULTURE: NO GROWTH
CULTURE: NO GROWTH

## 2014-11-10 LAB — CBC
HEMATOCRIT: 26.2 % — AB (ref 36.0–46.0)
Hemoglobin: 8.6 g/dL — ABNORMAL LOW (ref 12.0–15.0)
MCH: 28.8 pg (ref 26.0–34.0)
MCHC: 32.8 g/dL (ref 30.0–36.0)
MCV: 87.6 fL (ref 78.0–100.0)
PLATELETS: 220 10*3/uL (ref 150–400)
RBC: 2.99 MIL/uL — ABNORMAL LOW (ref 3.87–5.11)
RDW: 13.7 % (ref 11.5–15.5)
WBC: 10.8 10*3/uL — ABNORMAL HIGH (ref 4.0–10.5)

## 2014-11-10 LAB — CULTURE, RESPIRATORY W GRAM STAIN: Culture: NORMAL

## 2014-11-10 LAB — PROCALCITONIN: Procalcitonin: 0.25 ng/mL

## 2014-11-10 LAB — MAGNESIUM: Magnesium: 1.5 mg/dL (ref 1.5–2.5)

## 2014-11-10 MED ORDER — FERROUS SULFATE 325 (65 FE) MG PO TABS
325.0000 mg | ORAL_TABLET | Freq: Two times a day (BID) | ORAL | Status: DC
Start: 2014-11-10 — End: 2014-11-12
  Administered 2014-11-10 – 2014-11-12 (×5): 325 mg via ORAL
  Filled 2014-11-10 (×5): qty 1

## 2014-11-10 MED ORDER — POTASSIUM CHLORIDE CRYS ER 20 MEQ PO TBCR
40.0000 meq | EXTENDED_RELEASE_TABLET | Freq: Two times a day (BID) | ORAL | Status: AC
  Administered 2014-11-10 (×2): 40 meq via ORAL
  Filled 2014-11-10: qty 2
  Filled 2014-11-10: qty 4

## 2014-11-10 MED ORDER — ALPRAZOLAM 0.5 MG PO TABS
0.5000 mg | ORAL_TABLET | Freq: Two times a day (BID) | ORAL | Status: DC | PRN
  Administered 2014-11-10 – 2014-11-12 (×6): 0.5 mg via ORAL
  Filled 2014-11-10 (×7): qty 1

## 2014-11-10 MED ORDER — INFLUENZA VAC SPLIT QUAD 0.5 ML IM SUSY
0.5000 mL | PREFILLED_SYRINGE | INTRAMUSCULAR | Status: AC
  Administered 2014-11-11: 0.5 mL via INTRAMUSCULAR
  Filled 2014-11-10 (×2): qty 0.5

## 2014-11-10 NOTE — Progress Notes (Signed)
Procedure(s) (LRB): VIDEO BRONCHOSCOPY (N/A) Subjective: RML lung abscess Patient coughing up cups of purulent material with chest physiotherapy Remains on IV antibiotics, afebrile Less pleuritic pain, appetite improving  Objective: Vital signs in last 24 hours: Temp:  [98.3 F (36.8 C)] 98.3 F (36.8 C) (03/22 1356) Pulse Rate:  [94-110] 94 (03/22 1356) Cardiac Rhythm:  [-]  Resp:  [18] 18 (03/22 1356) BP: (116-123)/(60-70) 122/70 mmHg (03/22 1356) SpO2:  [97 %-100 %] 100 % (03/22 1356)  Hemodynamic parameters for last 24 hours:  stable  Intake/Output from previous day: 03/21 0701 - 03/22 0700 In: 1490 [P.O.:240; I.V.:1250] Out: -  Intake/Output this shift: Total I/O In: 499.2 [I.V.:499.2] Out: -   Coarse bs on R HR regular  Lab Results:  Recent Labs  11/09/14 0515 11/10/14 0500  WBC 14.4* 10.8*  HGB 9.2* 8.6*  HCT 27.0* 26.2*  PLT 218 220   BMET:  Recent Labs  11/09/14 0515 11/10/14 0500  NA 136 140  K 3.3* 3.4*  CL 106 106  CO2 22 25  GLUCOSE 117* 86  BUN <5* 6  CREATININE 1.40* 1.49*  CALCIUM 8.0* 8.2*    PT/INR: No results for input(s): LABPROT, INR in the last 72 hours. ABG No results found for: PHART, HCO3, TCO2, ACIDBASEDEF, O2SAT CBG (last 3)  No results for input(s): GLUCAP in the last 72 hours.  Assessment/Plan: S/P Procedure(s) (LRB): VIDEO BRONCHOSCOPY (N/A) Plan bronchoscopy  in am prob home thurs on po augmentin Will follow in office w/ CXR in 2 weeks   LOS: 5 days    Margaret Yoder 11/10/2014

## 2014-11-10 NOTE — Progress Notes (Signed)
[facility] PROGRESS NOTE  Margaret Yoder [id number] DOB: [birth year] DOA: 11/05/2014 PCP: No primary care provider on file. Interim summary: 39 year old lady admitted for subjective fevers and chills. She was found to have pneumonia on CXR and possible lung abscess on CT chest. CTs consulted and plan for bronchoscopy in am.  Assessment/Plan: Sepsis from CAP with possible lung abscess: She was initially started on broad spectrum antibiotics  And zithromax added on 3/21 for atypical coverage and blood cultures and sputum cultures ordered and pending.  Initially she required about 3 liters of oxygen and we were able to wean her off the oxygen . She is on RA. Robitussin as needed.  Smoking cessation counseled.  Thoracic surgery consulted and recommendations given. Plan for bronchoscopy on Wednesday. HIV antibody is negative Urine for legionella negative. and strep antigen is negative.  Influenza PCR is negative. .  Lactic acid and procalcitonin levels are all negative.  She is coughing up brown material . Sputum cultures sent.     Hypertension controlled.   Tobacco abuse: Nicotine patch and cessation counseling given.   Acute renal failure on CKD stage 1:  UA is negative. Urine electrolytes done.  Fena is greater than 2, probably from Infection and ATN.  US renal does not show any hydronephrosis. On talking to the patient she reported that she has renal issues one year back. Discussed with Dr Justin Mend recommended outpatient follow up. meanwhile upep and spep ordered.   Subjective chills and myalgias: Influenza PCR is negative. Improved.    Anemia: Mild normocytic. Anemia panel sent showed low iron  Levels. Iron replacement ordered.   Fever: repeat CXR ordered showed enlarging cavitary process on the right.  Plan for bronchoscopy on Wednesday.   Hypokalemia: Replete as needed. Repeat level slightly low this am. Repeat in am.    Code Status: full code.  Family  Communication: family member at bedside Disposition Plan: pending further work up.    Consultants: Thoracic surgery  Dr Prescott Gum Procedures:  none  Antibiotics:  IV vancomycin   IV zosyn  HPI/Subjective Was teary eyed that she did nto get bronchoscopy today.   Objective: Filed Vitals:   11/10/14 1356  BP: 122/70  Pulse: 94  Temp: 98.3 F (36.8 C)  Resp: 18    Intake/Output Summary (Last 24 hours) at 11/10/14 1836 Last data filed at 11/10/14 1559  Gross per 24 hour  Intake 1749.17 ml  Output      0 ml  Net 1749.17 ml   Filed Weights   11/05/14 1113  Weight: 77.111 kg (170 lb)    Exam:   General:  Alert not in distress, .   Cardiovascular: s1s2 tachycardic  Respiratory: scattered rhonchi, no wheezing heard.   Abdomen: soft non tender non distended bowel sounds heard  Musculoskeletal: trace pedal edema.   Data Reviewed: Basic Metabolic Panel:  Recent Labs Lab 11/05/14 1620 11/07/14 0421 11/08/14 1225 11/09/14 0515 11/10/14 0500  NA 138 136 136 136 140  K 3.5 4.3 3.6 3.3* 3.4*  CL 112 108 107 106 106  CO2 20 20 18* 22 25  GLUCOSE 99 71 110* 117* 86  BUN 12 9 6  <5* 6  CREATININE 1.40* 1.53* 1.36* 1.40* 1.49*  CALCIUM 8.2* 8.2* 8.0* 8.0* 8.2*  MG  --   --   --   --  1.5   Liver Function Tests:  Recent Labs Lab 11/05/14 1620  AST 14  ALT 23  ALKPHOS 56  BILITOT  0.4  PROT 6.7  ALBUMIN 2.9*   No results for input(s): LIPASE, AMYLASE in the last 168 hours. No results for input(s): AMMONIA in the last 168 hours. CBC:  Recent Labs Lab 11/05/14 1620 11/07/14 0421 11/08/14 1225 11/09/14 0515 11/10/14 0500  WBC 13.2* 19.6* 20.1* 14.4* 10.8*  NEUTROABS 7.8*  --   --   --   --   HGB 11.3* 11.5* 10.1* 9.2* 8.6*  HCT 33.6* 34.9* 29.9* 27.0* 26.2*  MCV 88.4 89.3 88.2 87.4 87.6  PLT 337 321 246 218 220   Cardiac Enzymes: No results for input(s): CKTOTAL, CKMB, CKMBINDEX, TROPONINI in the last 168 hours. BNP (last 3 results) No  results for input(s): BNP in the last 8760 hours.  ProBNP (last 3 results) No results for input(s): PROBNP in the last 8760 hours.  CBG: No results for input(s): GLUCAP in the last 168 hours.  Recent Results (from the past 240 hour(s))  Culture, blood (routine x 2) Call MD if unable to obtain prior to antibiotics being given     Status: None   Collection Time: 11/05/14  4:20 PM  Result Value Ref Range Status   Specimen Description BLOOD LEFT ARM  Final   Special Requests BOTTLES DRAWN AEROBIC AND ANAEROBIC 6CC EACH  Final   Culture NO GROWTH 5 DAYS  Final   Report Status 11/10/2014 FINAL  Final  Culture, blood (routine x 2) Call MD if unable to obtain prior to antibiotics being given     Status: None   Collection Time: 11/05/14  6:52 PM  Result Value Ref Range Status   Specimen Description BLOOD LEFT ARM  Final   Special Requests BOTTLES DRAWN AEROBIC AND ANAEROBIC [place]  Final   Culture NO GROWTH 5 DAYS  Final   Report Status 11/10/2014 FINAL  Final  Culture, sputum-assessment     Status: None   Collection Time: 11/06/14  2:58 PM  Result Value Ref Range Status   Specimen Description SPUTUM  Final   Special Requests NONE  Final   Sputum evaluation   Final    MICROSCOPIC FINDINGS SUGGEST THAT THIS SPECIMEN IS NOT REPRESENTATIVE OF LOWER RESPIRATORY SECRETIONS. PLEASE RECOLLECT. RESULT CALLED TO, READ BACK BY AND VERIFIED WITHLoleta Rose RN [phone] GREEN R    Report Status 11/06/2014 FINAL  Final  Culture, expectorated sputum-assessment     Status: None   Collection Time: 11/07/14  2:56 AM  Result Value Ref Range Status   Specimen Description SPU  Final   Special Requests NONE  Final   Sputum evaluation   Final    MICROSCOPIC FINDINGS SUGGEST THAT THIS SPECIMEN IS NOT REPRESENTATIVE OF LOWER RESPIRATORY SECRETIONS. PLEASE RECOLLECT. RESULT CALLED TO, READ BACK BY AND VERIFIED WITH: Nat Christen RN [id number] [phone] GREEN R    Report Status 11/07/2014 FINAL  Final  Culture,  respiratory (NON-Expectorated)     Status: None   Collection Time: 11/08/14  5:48 AM  Result Value Ref Range Status   Specimen Description SPUTUM  Final   Special Requests NONE  Final   Gram Stain   Final    FEW WBC PRESENT,BOTH PMN AND MONONUCLEAR FEW SQUAMOUS EPITHELIAL CELLS PRESENT RARE GRAM POSITIVE COCCI IN PAIRS RARE GRAM NEGATIVE RODS RARE YEAST    Culture   Final    NORMAL OROPHARYNGEAL FLORA Performed at [facility]    Report Status 11/10/2014 FINAL  Final  Culture, expectorated sputum-assessment     Status: None  Collection Time: 11/08/14  6:00 AM  Result Value Ref Range Status   Specimen Description SPUTUM  Final   Special Requests NONE  Final   Sputum evaluation   Final    THIS SPECIMEN IS ACCEPTABLE. RESPIRATORY CULTURE REPORT TO FOLLOW.   Report Status 11/08/2014 FINAL  Final     Studies: Dg Chest 2 View  11/10/2014   CLINICAL DATA:  Cavitating right upper lobe lesion  EXAM: CHEST  2 VIEW  COMPARISON:  11/08/2014  FINDINGS: Right upper lobe rounded area of infiltrate with cavitation is noted consistent with an evolving lung abscess. The overall appearance is stable from the prior exam. The left lung is clear. The cardiac shadow is stable. No bony abnormality is noted.  IMPRESSION: Overall stable appearance of the cavitary lesion within the right upper lobe.   Electronically Signed   By: Inez Catalina M.D.   On: 11/10/2014 16:57    Scheduled Meds: . azithromycin  500 mg Intravenous Q24H  . feeding supplement (ENSURE COMPLETE)  237 mL Oral BID BM  . ferrous sulfate  325 mg Oral BID WC  . heparin  5,000 Units Subcutaneous 3 times per day  . hydrochlorothiazide  12.5 mg Oral Daily  . [START ON 11/11/2014] Influenza vac split quadrivalent PF  0.5 mL Intramuscular Tomorrow-1000  . ipratropium-albuterol  3 mL Nebulization TID  . lisinopril  10 mg Oral Daily  . nicotine  21 mg Transdermal Daily  . piperacillin-tazobactam (ZOSYN)  IV  3.375 g Intravenous Q8H   . pneumococcal 23 valent vaccine  0.5 mL Intramuscular Tomorrow-1000  . potassium chloride  40 mEq Oral BID  . vancomycin  750 mg Intravenous Q12H   Continuous Infusions: . sodium chloride 50 mL/hr at 11/10/14 1015    Active Problems:   Community acquired pneumonia   Hypertension   Acute renal failure   Sepsis   Leukocytosis   Anemia    Time spent: 25 minutes.     [facility] Pager [phone]. If 7PM-7AM, please contact night-coverage at [email or web address], password [facility] 11/10/2014, 6:36 PM  LOS: 5 days

## 2014-11-11 ENCOUNTER — Inpatient Hospital Stay (HOSPITAL_COMMUNITY): Payer: Self-pay | Admitting: Anesthesiology

## 2014-11-11 ENCOUNTER — Inpatient Hospital Stay (HOSPITAL_COMMUNITY): Payer: MEDICAID | Admitting: Anesthesiology

## 2014-11-11 ENCOUNTER — Encounter (HOSPITAL_COMMUNITY)
Admission: EM | Disposition: A | Discharge status: Home / Self Care | Payer: Self-pay | Source: Home / Self Care | Attending: Internal Medicine

## 2014-11-11 DIAGNOSIS — D649 Anemia, unspecified: Secondary | ICD-10-CM

## 2014-11-11 DIAGNOSIS — J852 Abscess of lung without pneumonia: Secondary | ICD-10-CM

## 2014-11-11 HISTORY — PX: VIDEO BRONCHOSCOPY: SHX5072

## 2014-11-11 LAB — BASIC METABOLIC PANEL
ANION GAP: 7 (ref 5–15)
BUN: <5 mg/dL — ABNORMAL LOW (ref 6–23)
CALCIUM: 8.4 mg/dL (ref 8.4–10.5)
CO2: 23 mmol/L (ref 19–32)
CREATININE: 1.45 mg/dL — AB (ref 0.50–1.10)
Chloride: 108 mmol/L (ref 96–112)
GFR calc non Af Amer: 45 mL/min — ABNORMAL LOW (ref >90)
GFR, EST AFRICAN AMERICAN: 52 mL/min — AB (ref >90)
Glucose, Bld: 78 mg/dL (ref 70–99)
Potassium: 3.8 mmol/L (ref 3.5–5.1)
SODIUM: 138 mmol/L (ref 135–145)

## 2014-11-11 LAB — CBC
HCT: 30.1 % — ABNORMAL LOW (ref 36.0–46.0)
Hemoglobin: 10 g/dL — ABNORMAL LOW (ref 12.0–15.0)
MCH: 29.2 pg (ref 26.0–34.0)
MCHC: 33.2 g/dL (ref 30.0–36.0)
MCV: 88 fL (ref 78.0–100.0)
Platelets: 266 10*3/uL (ref 150–400)
RBC: 3.42 MIL/uL — ABNORMAL LOW (ref 3.87–5.11)
RDW: 14 % (ref 11.5–15.5)
WBC: 11.5 10*3/uL — ABNORMAL HIGH (ref 4.0–10.5)

## 2014-11-11 LAB — SURGICAL PCR SCREEN
MRSA, PCR: NEGATIVE
Staphylococcus aureus: NEGATIVE

## 2014-11-11 LAB — PROTEIN ELECTROPHORESIS, SERUM
A/G Ratio: 0.6 — ABNORMAL LOW (ref 0.7–2.0)
ALPHA-1-GLOBULIN: 0.5 g/dL — AB (ref 0.1–0.4)
Albumin ELP: 2.2 g/dL — ABNORMAL LOW (ref 3.2–5.6)
Alpha-2-Globulin: 1.2 g/dL (ref 0.4–1.2)
BETA GLOBULIN: 0.8 g/dL (ref 0.6–1.3)
Gamma Globulin: 0.9 g/dL (ref 0.5–1.6)
Globulin, Total: 3.4 g/dL (ref 2.0–4.5)
TOTAL PROTEIN ELP: 5.6 g/dL — AB (ref 6.0–8.5)

## 2014-11-11 SURGERY — BRONCHOSCOPY, VIDEO-ASSISTED
Anesthesia: General

## 2014-11-11 MED ORDER — NEOSTIGMINE METHYLSULFATE 10 MG/10ML IV SOLN
INTRAVENOUS | Status: AC
  Filled 2014-11-11: qty 1

## 2014-11-11 MED ORDER — NEOSTIGMINE METHYLSULFATE 10 MG/10ML IV SOLN
INTRAVENOUS | Status: DC | PRN
  Administered 2014-11-11: 4 mg via INTRAVENOUS

## 2014-11-11 MED ORDER — FENTANYL CITRATE 0.05 MG/ML IJ SOLN
INTRAMUSCULAR | Status: DC | PRN
  Administered 2014-11-11: 100 ug via INTRAVENOUS
  Administered 2014-11-11: 150 ug via INTRAVENOUS

## 2014-11-11 MED ORDER — PROPOFOL 10 MG/ML IV BOLUS
INTRAVENOUS | Status: DC | PRN
  Administered 2014-11-11: 200 mg via INTRAVENOUS

## 2014-11-11 MED ORDER — LIDOCAINE HCL (CARDIAC) 20 MG/ML IV SOLN
INTRAVENOUS | Status: DC | PRN
  Administered 2014-11-11: 50 mg via INTRAVENOUS

## 2014-11-11 MED ORDER — AZITHROMYCIN 250 MG PO TABS
500.0000 mg | ORAL_TABLET | Freq: Every day | ORAL | Status: DC
  Administered 2014-11-11 – 2014-11-12 (×2): 500 mg via ORAL
  Filled 2014-11-11 (×2): qty 2

## 2014-11-11 MED ORDER — SUCCINYLCHOLINE CHLORIDE 20 MG/ML IJ SOLN
INTRAMUSCULAR | Status: DC | PRN
  Administered 2014-11-11: 100 mg via INTRAVENOUS

## 2014-11-11 MED ORDER — GLYCOPYRROLATE 0.2 MG/ML IJ SOLN
INTRAMUSCULAR | Status: DC | PRN
  Administered 2014-11-11: .7 mg via INTRAVENOUS

## 2014-11-11 MED ORDER — PROPOFOL 10 MG/ML IV BOLUS
INTRAVENOUS | Status: AC
  Filled 2014-11-11: qty 20

## 2014-11-11 MED ORDER — EPHEDRINE SULFATE 50 MG/ML IJ SOLN
INTRAMUSCULAR | Status: AC
  Filled 2014-11-11: qty 1

## 2014-11-11 MED ORDER — ONDANSETRON HCL 4 MG/2ML IJ SOLN
INTRAMUSCULAR | Status: DC | PRN
  Administered 2014-11-11: 4 mg via INTRAVENOUS

## 2014-11-11 MED ORDER — MIDAZOLAM HCL 5 MG/5ML IJ SOLN
INTRAMUSCULAR | Status: DC | PRN
  Administered 2014-11-11: 2 mg via INTRAVENOUS

## 2014-11-11 MED ORDER — VANCOMYCIN HCL IN DEXTROSE 750-5 MG/150ML-% IV SOLN
750.0000 mg | INTRAVENOUS | Status: AC
  Administered 2014-11-11: 750 mg via INTRAVENOUS
  Filled 2014-11-11: qty 150

## 2014-11-11 MED ORDER — PHENOL 1.4 % MT LIQD: 1.0000 | OROMUCOSAL | Status: DC | PRN

## 2014-11-11 MED ORDER — MAGNESIUM OXIDE 400 (241.3 MG) MG PO TABS
400.0000 mg | ORAL_TABLET | Freq: Every day | ORAL | Status: DC
  Administered 2014-11-11 – 2014-11-12 (×2): 400 mg via ORAL
  Filled 2014-11-11 (×4): qty 1

## 2014-11-11 MED ORDER — PROMETHAZINE HCL 25 MG/ML IJ SOLN: 6.2500 mg | INTRAMUSCULAR | Status: DC | PRN

## 2014-11-11 MED ORDER — LIDOCAINE HCL (CARDIAC) 20 MG/ML IV SOLN
INTRAVENOUS | Status: AC
  Filled 2014-11-11: qty 10

## 2014-11-11 MED ORDER — 0.9 % SODIUM CHLORIDE (POUR BTL) OPTIME
TOPICAL | Status: DC | PRN
  Administered 2014-11-11: 1000 mL

## 2014-11-11 MED ORDER — LACTATED RINGERS IV SOLN
INTRAVENOUS | Status: DC | PRN
  Administered 2014-11-11: 08:00:00 via INTRAVENOUS

## 2014-11-11 MED ORDER — ROCURONIUM BROMIDE 50 MG/5ML IV SOLN
INTRAVENOUS | Status: AC
  Filled 2014-11-11: qty 1

## 2014-11-11 MED ORDER — OXYCODONE HCL 5 MG/5ML PO SOLN: 5.0000 mg | Freq: Once | ORAL | Status: DC | PRN

## 2014-11-11 MED ORDER — SUCCINYLCHOLINE CHLORIDE 20 MG/ML IJ SOLN
INTRAMUSCULAR | Status: AC
  Filled 2014-11-11: qty 1

## 2014-11-11 MED ORDER — ROCURONIUM BROMIDE 100 MG/10ML IV SOLN
INTRAVENOUS | Status: DC | PRN
  Administered 2014-11-11: 20 mg via INTRAVENOUS

## 2014-11-11 MED ORDER — ONDANSETRON HCL 4 MG/2ML IJ SOLN
INTRAMUSCULAR | Status: AC
  Filled 2014-11-11: qty 2

## 2014-11-11 MED ORDER — OXYCODONE HCL 5 MG PO TABS: 5.0000 mg | ORAL_TABLET | Freq: Once | ORAL | Status: DC | PRN

## 2014-11-11 MED ORDER — MIDAZOLAM HCL 2 MG/2ML IJ SOLN
INTRAMUSCULAR | Status: AC
  Filled 2014-11-11: qty 2

## 2014-11-11 MED ORDER — GLYCOPYRROLATE 0.2 MG/ML IJ SOLN
INTRAMUSCULAR | Status: AC
  Filled 2014-11-11: qty 4

## 2014-11-11 MED ORDER — PHENYLEPHRINE 40 MCG/ML (10ML) SYRINGE FOR IV PUSH (FOR BLOOD PRESSURE SUPPORT)
PREFILLED_SYRINGE | INTRAVENOUS | Status: AC
  Filled 2014-11-11: qty 10

## 2014-11-11 MED ORDER — FENTANYL CITRATE 0.05 MG/ML IJ SOLN: 25.0000 ug | INTRAMUSCULAR | Status: DC | PRN

## 2014-11-11 MED ORDER — FENTANYL CITRATE 0.05 MG/ML IJ SOLN
INTRAMUSCULAR | Status: AC
  Filled 2014-11-11: qty 5

## 2014-11-11 SURGICAL SUPPLY — 27 items
BALL CTTN LRG ABS STRL LF (GAUZE/BANDAGES/DRESSINGS)
BRUSH CYTOL CELLEBRITY 1.5X140 (MISCELLANEOUS) ×2 IMPLANT
CANISTER SUCTION 2500CC (MISCELLANEOUS) ×3 IMPLANT
CONT SPEC 4OZ CLIKSEAL STRL BL (MISCELLANEOUS) ×5 IMPLANT
COTTONBALL LRG STERILE PKG (GAUZE/BANDAGES/DRESSINGS) IMPLANT
COVER TABLE BACK 60X90 (DRAPES) ×3 IMPLANT
FORCEPS BIOP RJ4 1.8 (CUTTING FORCEPS) IMPLANT
GAUZE SPONGE 4X4 12PLY STRL (GAUZE/BANDAGES/DRESSINGS) ×3 IMPLANT
GLOVE BIO SURGEON STRL SZ7.5 (GLOVE) ×6 IMPLANT
KIT CLEAN ENDO COMPLIANCE (KITS) ×3 IMPLANT
KIT ROOM TURNOVER OR (KITS) ×3 IMPLANT
MARKER SKIN DUAL TIP RULER LAB (MISCELLANEOUS) ×3 IMPLANT
NDL BIOPSY TRANSBRONCH 21G (NEEDLE) IMPLANT
NDL BLUNT 18X1 FOR OR ONLY (NEEDLE) IMPLANT
NEEDLE BIOPSY TRANSBRONCH 21G (NEEDLE) IMPLANT
NEEDLE BLUNT 18X1 FOR OR ONLY (NEEDLE) IMPLANT
NEEDLE HYPO 22GX1.5 SAFETY (NEEDLE) IMPLANT
NS IRRIG 1000ML POUR BTL (IV SOLUTION) ×3 IMPLANT
OIL SILICONE PENTAX (PARTS (SERVICE/REPAIRS)) ×3 IMPLANT
PAD ARMBOARD 7.5X6 YLW CONV (MISCELLANEOUS) ×6 IMPLANT
SYR 20ML ECCENTRIC (SYRINGE) ×3 IMPLANT
SYR 5ML LUER SLIP (SYRINGE) ×3 IMPLANT
SYR CONTROL 10ML LL (SYRINGE) IMPLANT
TOWEL OR 17X24 6PK STRL BLUE (TOWEL DISPOSABLE) ×3 IMPLANT
TRAP SPECIMEN MUCOUS 40CC (MISCELLANEOUS) ×3 IMPLANT
TUBE CONNECTING 20'X1/4 (TUBING) ×2
TUBE CONNECTING 20X1/4 (TUBING) ×4 IMPLANT

## 2014-11-11 NOTE — Anesthesia Postprocedure Evaluation (Signed)
  Anesthesia Post-op Note  Patient: Margaret Yoder  Procedure(s) Performed: Procedure(s): VIDEO BRONCHOSCOPY (N/A)  Patient Location: PACU  Anesthesia Type:General  Level of Consciousness: awake and alert   Airway and Oxygen Therapy: Patient Spontanous Breathing  Post-op Pain: none  Post-op Assessment: Post-op Vital signs reviewed  Post-op Vital Signs: Reviewed  Last Vitals:  Filed Vitals:   11/11/14 1004  BP: 121/67  Pulse: 90  Temp: 36.6 C  Resp: 16    Complications: No apparent anesthesia complications

## 2014-11-11 NOTE — Brief Op Note (Signed)
11/05/2014 - 11/11/2014  9:14 AM  PATIENT:  Margaret Yoder  39 y.o. female  PRE-OPERATIVE DIAGNOSIS:   RIGHT LUNG ABCESS  POST-OPERATIVE DIAGNOSIS:  RIGHT LUNG ABCESS  PROCEDURE:  Procedure(s): VIDEO BRONCHOSCOPY (N/A)  SURGEON:  Surgeon(s) and Role:    * Ivin Poot, MD - Primary  PHYSICIAN ASSISTANT:   ASSISTANTS: none   ANESTHESIA:   general  EBL:     BLOOD ADMINISTERED:none  DRAINS: none   LOCAL MEDICATIONS USED:  NONE  SPECIMEN:  Lavage/Washing and Scraping, RML brushing  DISPOSITION OF SPECIMEN:  PATHOLOGY, micro lab  COUNTS:  YES  TOURNIQUET:  * No tourniquets in log *  DICTATION: .Dragon Dictation  PLAN OF CARE: Admit to inpatient   PATIENT DISPOSITION:  PACU - hemodynamically stable.   Delay start of Pharmacological VTE agent (>24hrs) due to surgical blood loss or risk of bleeding: yes

## 2014-11-11 NOTE — Addendum Note (Signed)
Addendum  created 11/11/14 1100 by Eligha Bridegroom, CRNA   Modules edited: Anesthesia Review and Sign Navigator Section

## 2014-11-11 NOTE — Anesthesia Procedure Notes (Signed)
Procedure Name: Intubation Date/Time: 11/11/2014 8:38 AM Performed by: Eligha Bridegroom Pre-anesthesia Checklist: Emergency Drugs available, Patient identified, Timeout performed, Suction available and Patient being monitored Patient Re-evaluated:Patient Re-evaluated prior to inductionOxygen Delivery Method: Circle system utilized Preoxygenation: Pre-oxygenation with 100% oxygen Intubation Type: IV induction Ventilation: Mask ventilation without difficulty and Oral airway inserted - appropriate to patient size Laryngoscope Size: Mac and 4 Grade View: Grade I Tube type: Oral Tube size: 8.5 mm Placement Confirmation: ETT inserted through vocal cords under direct vision,  breath sounds checked- equal and bilateral and positive ETCO2 Secured at: 22 cm Tube secured with: Tape Dental Injury: Teeth and Oropharynx as per pre-operative assessment

## 2014-11-11 NOTE — Transfer of Care (Signed)
Immediate Anesthesia Transfer of Care Note  Patient: Margaret Yoder  Procedure(s) Performed: Procedure(s): VIDEO BRONCHOSCOPY (N/A)  Patient Location: PACU  Anesthesia Type:General  Level of Consciousness: awake, alert  and oriented  Airway & Oxygen Therapy: Patient Spontanous Breathing and Patient connected to nasal cannula oxygen  Post-op Assessment: Report given to RN and Post -op Vital signs reviewed and stable  Post vital signs: Reviewed and stable  Last Vitals:  Filed Vitals:   11/11/14 0915  BP: 101/62  Pulse: 97  Temp: 36.5 C  Resp: 25    Complications: No apparent anesthesia complications

## 2014-11-11 NOTE — Anesthesia Preprocedure Evaluation (Addendum)
Anesthesia Evaluation  Patient identified by MRN, date of birth, ID band Patient awake    Reviewed: Allergy & Precautions, NPO status , Patient's Chart, lab work & pertinent test results  Airway Mallampati: II  TM Distance: >3 FB Neck ROM: Full    Dental  (+) Teeth Intact, Dental Advisory Given   Pulmonary pneumonia -, Current Smoker,  Lung abscess breath sounds clear to auscultation        Cardiovascular hypertension, Pt. on medications Rhythm:Regular Rate:Normal     Neuro/Psych Anxiety negative neurological ROS     GI/Hepatic negative GI ROS, Neg liver ROS,   Endo/Other  negative endocrine ROS  Renal/GU ARFRenal diseaseCr 1.45     Musculoskeletal negative musculoskeletal ROS (+)   Abdominal   Peds  Hematology  (+) anemia ,   Anesthesia Other Findings   Reproductive/Obstetrics                            Anesthesia Physical Anesthesia Plan  ASA: II  Anesthesia Plan: General   Post-op Pain Management:    Induction: Intravenous  Airway Management Planned: Oral ETT  Additional Equipment:   Intra-op Plan:   Post-operative Plan: Extubation in OR  Informed Consent: I have reviewed the patients History and Physical, chart, labs and discussed the procedure including the risks, benefits and alternatives for the proposed anesthesia with the patient or authorized representative who has indicated his/her understanding and acceptance.   Dental advisory given  Plan Discussed with: CRNA  Anesthesia Plan Comments:        Anesthesia Quick Evaluation

## 2014-11-11 NOTE — Progress Notes (Signed)
[facility] PROGRESS NOTE  Margaret Yoder [id number] DOB: 1976-04-13 DOA: 11/05/2014  PCP: Bridget Hartshorn, RN  Interim summary: 39 year old lady admitted for subjective fevers and chills. She was found to have pneumonia on CXR and possible lung abscess on CT chest. CTVS consulted and she underwent video bronchoscopy on 3/23.   Assessment/Plan:  Sepsis from CAP with Lung abscess She was initially started on broad spectrum antibiotics  And zithromax added on 3/21 for atypical coverage. Blood cultures obtained at the time of admission negative for any growth. Respiratory cultures have also not grown any specific organisms so far. Cultures obtained today at the time of video bronchoscopy are still pending. Anticipate transition to oral Augmentin tomorrow. Initially she required about 3 liters of oxygen and we were able to wean her off the oxygen. Robitussin as needed. Smoking cessation counseled. Thoracic surgery is following. They plan to see her in their office in 2 weeks with a repeat chest x-ray. She is also status post video bronchoscopy today. HIV antibody is negative. Urine for legionella negative. and strep antigen is negative. Influenza PCR is negative. Lactic acid and procalcitonin levels were all negative.   History of essential Hypertension Blood pressure is reasonably well controlled.   Tobacco abuse: Nicotine patch and cessation counseling given.   Questionable Acute renal failure on CKD stage 1 UA is negative. Urine electrolytes done.  Fena was greater than 2, probably from Infection and ATN.  US renal does not show any hydronephrosis. On talking to the patient she reported that she has renal issues one year back. All of this was discussed with the nephrologist, Dr. Justin Mend, who recommended outpatient follow-up. Renal function remains stable.  Subjective chills and myalgias Influenza PCR is negative. Improved.   Normocytic Anemia Anemia panel sent showed low iron  Levels. Iron replacement ordered. Ferritin was noted to be normal.  Hypokalemia Repleted. Magnesium level noted to be borderline low as well. We'll order magnesium oxide.  DVT prophylaxis: On subcutaneous heparin Code Status: full code.  Family Communication: Discussed with the patient Disposition Plan: Anticipate changing over to oral antibiotics tomorrow and then discharge later in the day.   Consultants: Thoracic surgery  Dr Prescott Gum  Procedures: Visual bronchoscopy 3/23  Antibiotics:  IV vancomycin   IV zosyn  Azithromycin 3/21?  Subjective Patient seen after her procedure. Somewhat drowsy. Denies any pain. No nausea, vomiting. Breathing is about the same. Still coughing brownish expectoration. Some specks of blood were noted.  Objective: Filed Vitals:   11/11/14 1004  BP: 121/67  Pulse: 90  Temp: 97.8 F (36.6 C)  Resp: 16    Intake/Output Summary (Last 24 hours) at 11/11/14 1021 Last data filed at 11/11/14 0915  Gross per 24 hour  Intake 1349.17 ml  Output      0 ml  Net 1349.17 ml   Filed Weights   11/05/14 1113  Weight: 77.111 kg (170 lb)    Exam:   General:  Alert not in distress, .   Cardiovascular: s1s2 tachycardic  Respiratory: scattered rhonchi, no wheezing heard. Overall, good air entry bilaterally.  Abdomen: soft non tender. non distended. bowel sounds heard. No masses or organomegaly   Data Reviewed: Basic Metabolic Panel:  Recent Labs Lab 11/07/14 0421 11/08/14 1225 11/09/14 0515 11/10/14 0500 11/11/14 0535  NA 136 136 136 140 138  K 4.3 3.6 3.3* 3.4* 3.8  CL 108 107 106 106 108  CO2 20 18* 22 25 23   GLUCOSE 71 110*  117* 86 78  BUN 9 6 <5* 6 <5*  CREATININE 1.53* 1.36* 1.40* 1.49* 1.45*  CALCIUM 8.2* 8.0* 8.0* 8.2* 8.4  MG  --   --   --  1.5  --    Liver Function Tests:  Recent Labs Lab 11/05/14 1620  AST 14  ALT 23  ALKPHOS 56  BILITOT 0.4  PROT 6.7  ALBUMIN 2.9*   CBC:  Recent Labs Lab 11/05/14 1620  11/07/14 0421 11/08/14 1225 11/09/14 0515 11/10/14 0500 11/11/14 0535  WBC 13.2* 19.6* 20.1* 14.4* 10.8* 11.5*  NEUTROABS 7.8*  --   --   --   --   --   HGB 11.3* 11.5* 10.1* 9.2* 8.6* 10.0*  HCT 33.6* 34.9* 29.9* 27.0* 26.2* 30.1*  MCV 88.4 89.3 88.2 87.4 87.6 88.0  PLT 337 321 246 218 220 266    Recent Results (from the past 240 hour(s))  Culture, blood (routine x 2) Call MD if unable to obtain prior to antibiotics being given     Status: None   Collection Time: 11/05/14  4:20 PM  Result Value Ref Range Status   Specimen Description BLOOD LEFT ARM  Final   Special Requests BOTTLES DRAWN AEROBIC AND ANAEROBIC 6CC EACH  Final   Culture NO GROWTH 5 DAYS  Final   Report Status 11/10/2014 FINAL  Final  Culture, blood (routine x 2) Call MD if unable to obtain prior to antibiotics being given     Status: None   Collection Time: 11/05/14  6:52 PM  Result Value Ref Range Status   Specimen Description BLOOD LEFT ARM  Final   Special Requests BOTTLES DRAWN AEROBIC AND ANAEROBIC [facility] EACH  Final   Culture NO GROWTH 5 DAYS  Final   Report Status 11/10/2014 FINAL  Final  Culture, sputum-assessment     Status: None   Collection Time: 11/06/14  2:58 PM  Result Value Ref Range Status   Specimen Description SPUTUM  Final   Special Requests NONE  Final   Sputum evaluation   Final    MICROSCOPIC FINDINGS SUGGEST THAT THIS SPECIMEN IS NOT REPRESENTATIVE OF LOWER RESPIRATORY SECRETIONS. PLEASE RECOLLECT. RESULT CALLED TO, READ BACK BY AND VERIFIED WITHLoleta Rose RN [phone] GREEN R    Report Status 11/06/2014 FINAL  Final  Culture, expectorated sputum-assessment     Status: None   Collection Time: 11/07/14  2:56 AM  Result Value Ref Range Status   Specimen Description SPU  Final   Special Requests NONE  Final   Sputum evaluation   Final    MICROSCOPIC FINDINGS SUGGEST THAT THIS SPECIMEN IS NOT REPRESENTATIVE OF LOWER RESPIRATORY SECRETIONS. PLEASE RECOLLECT. RESULT CALLED TO, READ BACK  BY AND VERIFIED WITH: Nat Christen RN [id number] [phone] GREEN R    Report Status 11/07/2014 FINAL  Final  Culture, respiratory (NON-Expectorated)     Status: None   Collection Time: 11/08/14  5:48 AM  Result Value Ref Range Status   Specimen Description SPUTUM  Final   Special Requests NONE  Final   Gram Stain   Final    FEW WBC PRESENT,BOTH PMN AND MONONUCLEAR FEW SQUAMOUS EPITHELIAL CELLS PRESENT RARE GRAM POSITIVE COCCI IN PAIRS RARE GRAM NEGATIVE RODS RARE YEAST    Culture   Final    NORMAL OROPHARYNGEAL FLORA Performed at [facility]    Report Status 11/10/2014 FINAL  Final  Culture, expectorated sputum-assessment     Status: None   Collection Time: 11/08/14  6:00 AM  Result Value Ref Range Status   Specimen Description SPUTUM  Final   Special Requests NONE  Final   Sputum evaluation   Final    THIS SPECIMEN IS ACCEPTABLE. RESPIRATORY CULTURE REPORT TO FOLLOW.   Report Status 11/08/2014 FINAL  Final  Surgical pcr screen     Status: None   Collection Time: 11/11/14  7:16 AM  Result Value Ref Range Status   MRSA, PCR NEGATIVE NEGATIVE Final   Staphylococcus aureus NEGATIVE NEGATIVE Final    Comment:        The Xpert SA Assay (FDA approved for NASAL specimens in patients over 46 years of age), is one component of a comprehensive surveillance program.  Test performance has been validated by [facility] for patients greater than or equal to 34 year old. It is not intended to diagnose infection nor to guide or monitor treatment.      Studies: Dg Chest 2 View  11/10/2014   CLINICAL DATA:  Cavitating right upper lobe lesion  EXAM: CHEST  2 VIEW  COMPARISON:  11/08/2014  FINDINGS: Right upper lobe rounded area of infiltrate with cavitation is noted consistent with an evolving lung abscess. The overall appearance is stable from the prior exam. The left lung is clear. The cardiac shadow is stable. No bony abnormality is noted.  IMPRESSION: Overall stable appearance of the  cavitary lesion within the right upper lobe.   Electronically Signed   By: Inez Catalina M.D.   On: 11/10/2014 16:57    Scheduled Meds: . azithromycin  500 mg Intravenous Q24H  . feeding supplement (ENSURE COMPLETE)  237 mL Oral BID BM  . ferrous sulfate  325 mg Oral BID WC  . heparin  5,000 Units Subcutaneous 3 times per day  . hydrochlorothiazide  12.5 mg Oral Daily  . Influenza vac split quadrivalent PF  0.5 mL Intramuscular Tomorrow-1000  . ipratropium-albuterol  3 mL Nebulization TID  . lisinopril  10 mg Oral Daily  . nicotine  21 mg Transdermal Daily  . piperacillin-tazobactam (ZOSYN)  IV  3.375 g Intravenous Q8H  . pneumococcal 23 valent vaccine  0.5 mL Intramuscular Tomorrow-1000  . vancomycin  750 mg Intravenous Q12H   Continuous Infusions: . sodium chloride 50 mL/hr at 11/10/14 1015    Active Problems:   Community acquired pneumonia   Hypertension   Acute renal failure   Sepsis   Leukocytosis   Anemia   Lung abscess    Time spent: 25 minutes.     [facility] Pager [phone].   If 7PM-7AM, please contact night-coverage at [email or web address], password [facility] 11/11/2014, 10:21 AM  LOS: 6 days

## 2014-11-11 NOTE — Progress Notes (Signed)
The patient was examined and preop studies reviewed. There has been no change from the prior exam and the patient is ready for surgery.  plan video bronchoscopy on [address] today

## 2014-11-11 NOTE — Progress Notes (Signed)
ANTIBIOTIC CONSULT NOTE  Pharmacy Consult for Vancomycin & Zosyn Indication: pneumonia  No Known Allergies  Patient Measurements: Height: 5\' 3"  (160 cm) Weight: 170 lb (77.111 kg) IBW/kg (Calculated) : 52.4  Vital Signs: Temp: 97.8 F (36.6 C) (03/23 1004) Temp Source: Oral (03/23 1004) BP: 121/67 mmHg (03/23 1004) Pulse Rate: 90 (03/23 1004) Intake/Output from previous day: 03/22 0701 - 03/23 0700 In: 699.2 [I.V.:499.2; IV Piggyback:200] Out: -  Intake/Output from this shift: Total I/O In: 650 [I.V.:650] Out: -   Labs:  Recent Labs  11/09/14 0515 11/10/14 0500 11/11/14 0535  WBC 14.4* 10.8* 11.5*  HGB 9.2* 8.6* 10.0*  PLT 218 220 266  CREATININE 1.40* 1.49* 1.45*   Estimated Creatinine Clearance: 51.2 mL/min (by C-G formula based on Cr of 1.45). No results for input(s): VANCOTROUGH, VANCOPEAK, VANCORANDOM, GENTTROUGH, GENTPEAK, GENTRANDOM, TOBRATROUGH, TOBRAPEAK, TOBRARND, AMIKACINPEAK, AMIKACINTROU, AMIKACIN in the last 72 hours.   Microbiology: Recent Results (from the past 720 hour(s))  Culture, blood (routine x 2) Call MD if unable to obtain prior to antibiotics being given     Status: None   Collection Time: 11/05/14  4:20 PM  Result Value Ref Range Status   Specimen Description BLOOD LEFT ARM  Final   Special Requests BOTTLES DRAWN AEROBIC AND ANAEROBIC [place]  Final   Culture NO GROWTH 5 DAYS  Final   Report Status 11/10/2014 FINAL  Final  Culture, blood (routine x 2) Call MD if unable to obtain prior to antibiotics being given     Status: None   Collection Time: 11/05/14  6:52 PM  Result Value Ref Range Status   Specimen Description BLOOD LEFT ARM  Final   Special Requests BOTTLES DRAWN AEROBIC AND ANAEROBIC [place]  Final   Culture NO GROWTH 5 DAYS  Final   Report Status 11/10/2014 FINAL  Final  Culture, sputum-assessment     Status: None   Collection Time: 11/06/14  2:58 PM  Result Value Ref Range Status   Specimen Description SPUTUM  Final    Special Requests NONE  Final   Sputum evaluation   Final    MICROSCOPIC FINDINGS SUGGEST THAT THIS SPECIMEN IS NOT REPRESENTATIVE OF LOWER RESPIRATORY SECRETIONS. PLEASE RECOLLECT. RESULT CALLED TO, READ BACK BY AND VERIFIED WITHLoleta Rose RN [phone] GREEN R    Report Status 11/06/2014 FINAL  Final  Culture, expectorated sputum-assessment     Status: None   Collection Time: 11/07/14  2:56 AM  Result Value Ref Range Status   Specimen Description SPU  Final   Special Requests NONE  Final   Sputum evaluation   Final    MICROSCOPIC FINDINGS SUGGEST THAT THIS SPECIMEN IS NOT REPRESENTATIVE OF LOWER RESPIRATORY SECRETIONS. PLEASE RECOLLECT. RESULT CALLED TO, READ BACK BY AND VERIFIED WITH: Nat Christen RN [id number] [phone] GREEN R    Report Status 11/07/2014 FINAL  Final  Culture, respiratory (NON-Expectorated)     Status: None   Collection Time: 11/08/14  5:48 AM  Result Value Ref Range Status   Specimen Description SPUTUM  Final   Special Requests NONE  Final   Gram Stain   Final    FEW WBC PRESENT,BOTH PMN AND MONONUCLEAR FEW SQUAMOUS EPITHELIAL CELLS PRESENT RARE GRAM POSITIVE COCCI IN PAIRS RARE GRAM NEGATIVE RODS RARE YEAST    Culture   Final    NORMAL OROPHARYNGEAL FLORA Performed at [facility]    Report Status 11/10/2014 FINAL  Final  Culture, expectorated sputum-assessment     Status:  None   Collection Time: 11/08/14  6:00 AM  Result Value Ref Range Status   Specimen Description SPUTUM  Final   Special Requests NONE  Final   Sputum evaluation   Final    THIS SPECIMEN IS ACCEPTABLE. RESPIRATORY CULTURE REPORT TO FOLLOW.   Report Status 11/08/2014 FINAL  Final  Surgical pcr screen     Status: None   Collection Time: 11/11/14  7:16 AM  Result Value Ref Range Status   MRSA, PCR NEGATIVE NEGATIVE Final   Staphylococcus aureus NEGATIVE NEGATIVE Final    Comment:        The Xpert SA Assay (FDA approved for NASAL specimens in patients over 39 years of age), is  one component of a comprehensive surveillance program.  Test performance has been validated by [facility] for patients greater than or equal to 39 year old. It is not intended to diagnose infection nor to guide or monitor treatment.     Anti-infectives    Start     Dose/Rate Route Frequency Ordered Stop   11/11/14 0900  vancomycin (VANCOCIN) IVPB 750 mg/150 ml premix     750 mg 150 mL/hr over 60 Minutes Intravenous To Surgery 11/11/14 0845 11/11/14 0900   11/09/14 1200  azithromycin (ZITHROMAX) 500 mg in dextrose 5 % 250 mL IVPB     500 mg 250 mL/hr over 60 Minutes Intravenous Every 24 hours 11/09/14 1047     11/06/14 0400  vancomycin (VANCOCIN) IVPB 750 mg/150 ml premix     750 mg 150 mL/hr over 60 Minutes Intravenous Every 12 hours 11/05/14 2224     11/05/14 1830  piperacillin-tazobactam (ZOSYN) IVPB 3.375 g     3.375 g 12.5 mL/hr over 240 Minutes Intravenous Every 8 hours 11/05/14 1823     11/05/14 1615  vancomycin (VANCOCIN) IVPB 1000 mg/200 mL premix     1,000 mg 200 mL/hr over 60 Minutes Intravenous  Once 11/05/14 1604 11/05/14 1715      Assessment: 39 yoF who presented with productive cough & fever x1 week. Chest CT + cavitary lesion, abscess.  She is on day # 7 of vancomycin and zosyn and day # 3 of azithromycin for RUL lung abscess. Scr 1.45 stable.   WBC 11/5 .  AF.  3/23: s/p video bronchoscopy for R lung abscess Per [facility] note will probably dc home Thursday on PO augmentin.   Zosyn 3/17>> Vancomycin 3/17>>  3/20 VT = 16.6 on 750 q12  3/23 bronch washing >> 3/20 sputum>>nl flora F 3/17 BCx x2>> NGF 3/18 & 3/19 sputum samples not acceptable Flu neg HIV neg   Goal of Therapy:  Vancomycin trough level 15-20 mcg/ml  Eradicate infection.  Plan:  Continue Zosyn 3.375gm IV Q8h to be infused over 4hrs Continue vancomycin 750mg  IV q12h I changed azithromycin to PO Anticipate DC home on PO augmentin soon  Eudelia Bunch, Pharm.D. [id number] 11/11/2014  10:53 AM

## 2014-11-12 LAB — UIFE/LIGHT CHAINS/TP QN, 24-HR UR
Albumin, U: DETECTED
Alpha 1, Urine: DETECTED — AB
Alpha 2, Urine: DETECTED — AB
Beta, Urine: DETECTED — AB
Gamma Globulin, Urine: DETECTED — AB
TOTAL PROTEIN, URINE-UPE24: 29 mg/dL — AB (ref 5–24)

## 2014-11-12 LAB — BASIC METABOLIC PANEL
Anion gap: 9 (ref 5–15)
BUN: 5 mg/dL — ABNORMAL LOW (ref 6–23)
CO2: 23 mmol/L (ref 19–32)
Calcium: 8.3 mg/dL — ABNORMAL LOW (ref 8.4–10.5)
Chloride: 108 mmol/L (ref 96–112)
Creatinine, Ser: 1.36 mg/dL — ABNORMAL HIGH (ref 0.50–1.10)
GFR calc non Af Amer: 48 mL/min — ABNORMAL LOW (ref >90)
GFR, EST AFRICAN AMERICAN: 56 mL/min — AB (ref >90)
Glucose, Bld: 104 mg/dL — ABNORMAL HIGH (ref 70–99)
POTASSIUM: 3.8 mmol/L (ref 3.5–5.1)
SODIUM: 140 mmol/L (ref 135–145)

## 2014-11-12 LAB — CBC
HCT: 25.8 % — ABNORMAL LOW (ref 36.0–46.0)
Hemoglobin: 8.6 g/dL — ABNORMAL LOW (ref 12.0–15.0)
MCH: 29.5 pg (ref 26.0–34.0)
MCHC: 33.3 g/dL (ref 30.0–36.0)
MCV: 88.4 fL (ref 78.0–100.0)
Platelets: 244 10*3/uL (ref 150–400)
RBC: 2.92 MIL/uL — ABNORMAL LOW (ref 3.87–5.11)
RDW: 13.9 % (ref 11.5–15.5)
WBC: 8.3 10*3/uL (ref 4.0–10.5)

## 2014-11-12 LAB — PROCALCITONIN: Procalcitonin: 0.11 ng/mL

## 2014-11-12 MED ORDER — AMOXICILLIN-POT CLAVULANATE 250-62.5 MG/5ML PO SUSR: 800.0000 mg | Freq: Three times a day (TID) | ORAL | Status: DC

## 2014-11-12 MED ORDER — AZITHROMYCIN 500 MG PO TABS: 500.0000 mg | ORAL_TABLET | Freq: Every day | ORAL | Status: DC

## 2014-11-12 MED ORDER — FERROUS SULFATE 325 (65 FE) MG PO TABS: 325.0000 mg | ORAL_TABLET | Freq: Two times a day (BID) | ORAL | Status: DC

## 2014-11-12 MED ORDER — AMOXICILLIN-POT CLAVULANATE 875-125 MG PO TABS
1.0000 | ORAL_TABLET | Freq: Two times a day (BID) | ORAL | Status: DC
  Administered 2014-11-12: 1 via ORAL
  Filled 2014-11-12: qty 1

## 2014-11-12 MED ORDER — NICOTINE 21 MG/24HR TD PT24: 21.0000 mg | MEDICATED_PATCH | Freq: Every day | TRANSDERMAL | Status: DC

## 2014-11-12 MED ORDER — MAGNESIUM OXIDE 400 (241.3 MG) MG PO TABS: 400.0000 mg | ORAL_TABLET | Freq: Every day | ORAL | Status: DC

## 2014-11-12 MED ORDER — GUAIFENESIN 100 MG/5ML PO SYRP: 200.0000 mg | ORAL_SOLUTION | ORAL | Status: DC | PRN

## 2014-11-12 MED ORDER — OXYCODONE-ACETAMINOPHEN 5-325 MG PO TABS: 1.0000 | ORAL_TABLET | ORAL | Status: DC | PRN

## 2014-11-12 MED ORDER — AMOXICILLIN-POT CLAVULANATE 875-125 MG PO TABS: 1.0000 | ORAL_TABLET | Freq: Two times a day (BID) | ORAL | Status: DC

## 2014-11-12 MED ORDER — GUAIFENESIN ER 600 MG PO TB12: 600.0000 mg | ORAL_TABLET | Freq: Two times a day (BID) | ORAL | Status: DC

## 2014-11-12 MED ORDER — BENZONATATE 200 MG PO CAPS: 200.0000 mg | ORAL_CAPSULE | Freq: Three times a day (TID) | ORAL | Status: DC | PRN

## 2014-11-12 NOTE — Op Note (Signed)
NAME:  Margaret Yoder, [name]:  [id number]  MEDICAL RECORD NO.:  [id number]  LOCATION:                                FACILITY:  [facility]  PHYSICIAN:  Ivin Poot, M.D.  DATE OF BIRTH:  01-22-1976  DATE OF PROCEDURE:  11/11/2014 DATE OF DISCHARGE:  11/11/2014                              OPERATIVE REPORT   OPERATION:  Video bronchoscopy with washings and brushings of right middle lobe.  SURGEON:  Ivin Poot, MD  PREOPERATIVE DIAGNOSIS:  Right middle lobe abscess.  POSTOPERATIVE DIAGNOSIS:  Right middle lobe abscess.  ANESTHESIA:  General.  CLINICAL NOTE:  The patient is a 39 year old, nondiabetic smoker, who has had an upper respiratory infection for several weeks.  She presented to the emergency room with shortness of breath, chest pain, and chest x- ray showed a dense consolidation at the right hilum.  A followup CT scan showed this consistent with a lung abscess.  She was started on IV antibiotics and admitted to the hospital.  She received physiotherapy. Her symptoms have progressively improved.  Radiographic evidence of decrease in size of the abscess with IV antibiotics is also demonstrated.  Bronchoscopy was recommended to the patient for further diagnosis and possible therapeutic benefits.  I described the procedure bronchoscopy under general anesthesia with the patient including the indications, benefits, alternatives, and risks.  She agreed to proceed under what I felt was an informed consent.  OPERATIVE PROCEDURE:  The patient was brought to the operating room, placed supine on the operating room table where general anesthesia was induced.  A proper time-out was performed.  Through the endotracheal tube, a video bronchoscopy, fiberoptic was inserted.  The distal trachea and carina were normal.  The left mainstem bronchus was examined.  There were no abnormalities other than some scant secretions.  The endobronchial segments of the left upper  lobe and left lower lobe were visualized and had no endobronchial lesions and were without significant secretion.  The bronchoscope was then passed on the right mainstem bronchus.  This was clear.  The right upper lobe endobronchial segments were visualized and there were no endobronchial lesions or significant secretions.  The bronchoscope was passed on the bronchus intermedius.  There were more significant secretions, fairly thin in this area.  The segments of the right lower lobe were visualized and there was some secretions which was removed, but no endobronchial lesions.  The segments of the right middle lobe were visualized.  There was some bloody secretions and some edema.  Washings as well as brushings were taken. The secretions were also sent for microbiology culture.  There was no significant bleeding.  The bronchoscope was withdrawn.  The patient was reversed from anesthesia, returned to recovery room.     Ivin Poot, M.D.     PV/MEDQ  D:  11/11/2014  T:  11/11/2014  Job:  [id number]

## 2014-11-12 NOTE — Discharge Planning (Signed)
Patient discharged home in stable condition. Verbalizes understanding of all discharge instructions, including home medications and follow up appointments. 

## 2014-11-12 NOTE — Care Management Note (Signed)
  Page 1 of 1   11/12/2014     11:06:37 AM CARE MANAGEMENT NOTE 11/12/2014  Patient:  Margaret Yoder, Margaret Yoder   Account Number:  [id number]  Date Initiated:  11/10/2014  Documentation initiated by:  Magdalen Spatz  Subjective/Objective Assessment:     Action/Plan:   Anticipated DC Date:  11/12/2014   Anticipated DC Plan:  [place]  CM consult  [place] [facility]      Choice offered to / List presented to:             Status of service:  Completed, signed off Medicare Important Message given?   (If response is "NO", the following Medicare IM given date fields will be blank) Date Medicare IM given:   Medicare IM given by:   Date Additional Medicare IM given:   Additional Medicare IM given by:    Discharge Disposition:  HOME/SELF CARE  Per UR Regulation:  Reviewed for med. necessity/level of care/duration of stay  If discussed at [place] of Stay Meetings, dates discussed:   11/10/2014  11/12/2014    Comments:  11-12-14 Gave and explained [place] letter , patient voiced understanding . Provided [facility] Department Information. Patient has PCP DR Lissa Hoard and will contiune seeing her. Magdalen Spatz RN BSN

## 2014-11-12 NOTE — Discharge Instructions (Signed)

## 2014-11-12 NOTE — Discharge Summary (Signed)
[facility]  Physician Discharge Summary   Patient ID: Margaret Yoder MRN: [id number] DOB/AGE: 1975-09-11 39 y.o.  Admit date: 11/05/2014 Discharge date: 11/12/2014  PCP: Bridget Hartshorn, RN  DISCHARGE DIAGNOSES:  Active Problems:   Community acquired pneumonia   Hypertension   Acute renal failure   Sepsis   Leukocytosis   Anemia   Lung abscess   RECOMMENDATIONS FOR OUTPATIENT FOLLOW UP: 1. Patient to follow-up with cardiothoracic surgery in 2 weeks with a chest x-ray. 2. PCP to consider referral to nephrology for elevated creatinine.  DISCHARGE CONDITION: fair  Diet recommendation: Low-sodium  Filed Weights   11/05/14 1113  Weight: 77.111 kg (170 lb)    INITIAL HISTORY: 39 year old lady admitted for subjective fevers and chills. She was found to have pneumonia on CXR and possible lung abscess on CT chest. [facility] consulted and she underwent video bronchoscopy on 3/23.   Consultants: Thoracic surgery Dr Prescott Gum  Procedures: Visual bronchoscopy 3/23  HOSPITAL COURSE:   Sepsis from CAP with Lung abscess Patient was admitted to the hospital. She was initially started on broad spectrum antibiotics And zithromax added on 3/21 for atypical coverage. Blood cultures obtained at the time of admission negative for any growth. Respiratory cultures have also not grown any specific organisms so far. Cultures obtained at the time of video bronchoscopy are still pending. Patient was transitioned over to Augmentin this morning. She is tolerated it quite well. Initially she required about 3 liters of oxygen and we were able to wean her off the oxygen. She can take Mucinex and Robitussin as needed. Smoking cessation counseled. Thoracic surgery was following. They plan to see her in their office in 2 weeks with a repeat chest x-ray. She is also status post video bronchoscopy. HIV antibody is negative. Urine for legionella negative. and strep antigen is negative.  Influenza PCR is negative. Lactic acid and procalcitonin levels were all negative. Overall, patient is improved. She still has a cough with brownish expectoration.  History of essential Hypertension Blood pressure is reasonably well controlled.   Tobacco abuse: Nicotine patch and cessation counseling provided.   Questionable Acute renal failure on CKD stage 1 UA is negative. Urine electrolytes done. FENA was greater than 2, probably from Infection and ATN. US renal does not show any hydronephrosis. On talking to the patient she reported that she has renal issues one year back. All of this was discussed with the nephrologist, Dr. Justin Mend, who recommended outpatient follow-up. Renal function remains stable.  Subjective chills and myalgias Influenza PCR is negative. Improved.   Normocytic Anemia Anemia panel sent showed low iron Levels. Iron replacement ordered. Ferritin was noted to be normal. Hemoglobin remained stable. No overt pleading.  Hypokalemia Repleted. Magnesium level noted to be borderline low as well. Will be given magnesium oxide.  Overall, stable. Improved. She wants to go home. Okay for discharge.  PERTINENT LABS:  The results of significant diagnostics from this hospitalization (including imaging, microbiology, ancillary and laboratory) are listed below for reference.    Microbiology: Recent Results (from the past 240 hour(s))  Culture, blood (routine x 2) Call MD if unable to obtain prior to antibiotics being given     Status: None   Collection Time: 11/05/14  4:20 PM  Result Value Ref Range Status   Specimen Description BLOOD LEFT ARM  Final   Special Requests BOTTLES DRAWN AEROBIC AND ANAEROBIC [place]  Final   Culture NO GROWTH 5 DAYS  Final   Report  Status 11/10/2014 FINAL  Final  Culture, blood (routine x 2) Call MD if unable to obtain prior to antibiotics being given     Status: None   Collection Time: 11/05/14  6:52 PM  Result Value Ref Range Status    Specimen Description BLOOD LEFT ARM  Final   Special Requests BOTTLES DRAWN AEROBIC AND ANAEROBIC [place]  Final   Culture NO GROWTH 5 DAYS  Final   Report Status 11/10/2014 FINAL  Final  Culture, sputum-assessment     Status: None   Collection Time: 11/06/14  2:58 PM  Result Value Ref Range Status   Specimen Description SPUTUM  Final   Special Requests NONE  Final   Sputum evaluation   Final    MICROSCOPIC FINDINGS SUGGEST THAT THIS SPECIMEN IS NOT REPRESENTATIVE OF LOWER RESPIRATORY SECRETIONS. PLEASE RECOLLECT. RESULT CALLED TO, READ BACK BY AND VERIFIED WITHLoleta Rose RN [phone] GREEN R    Report Status 11/06/2014 FINAL  Final  Culture, expectorated sputum-assessment     Status: None   Collection Time: 11/07/14  2:56 AM  Result Value Ref Range Status   Specimen Description SPU  Final   Special Requests NONE  Final   Sputum evaluation   Final    MICROSCOPIC FINDINGS SUGGEST THAT THIS SPECIMEN IS NOT REPRESENTATIVE OF LOWER RESPIRATORY SECRETIONS. PLEASE RECOLLECT. RESULT CALLED TO, READ BACK BY AND VERIFIED WITH: Nat Christen RN [id number] [phone] GREEN R    Report Status 11/07/2014 FINAL  Final  Culture, respiratory (NON-Expectorated)     Status: None   Collection Time: 11/08/14  5:48 AM  Result Value Ref Range Status   Specimen Description SPUTUM  Final   Special Requests NONE  Final   Gram Stain   Final    FEW WBC PRESENT,BOTH PMN AND MONONUCLEAR FEW SQUAMOUS EPITHELIAL CELLS PRESENT RARE GRAM POSITIVE COCCI IN PAIRS RARE GRAM NEGATIVE RODS RARE YEAST    Culture   Final    NORMAL OROPHARYNGEAL FLORA Performed at [facility]    Report Status 11/10/2014 FINAL  Final  Culture, expectorated sputum-assessment     Status: None   Collection Time: 11/08/14  6:00 AM  Result Value Ref Range Status   Specimen Description SPUTUM  Final   Special Requests NONE  Final   Sputum evaluation   Final    THIS SPECIMEN IS ACCEPTABLE. RESPIRATORY CULTURE REPORT TO FOLLOW.    Report Status 11/08/2014 FINAL  Final  Surgical pcr screen     Status: None   Collection Time: 11/11/14  7:16 AM  Result Value Ref Range Status   MRSA, PCR NEGATIVE NEGATIVE Final   Staphylococcus aureus NEGATIVE NEGATIVE Final    Comment:        The Xpert SA Assay (FDA approved for NASAL specimens in patients over 38 years of age), is one component of a comprehensive surveillance program.  Test performance has been validated by [facility] for patients greater than or equal to 74 year old. It is not intended to diagnose infection nor to guide or monitor treatment.   Culture, respiratory (NON-Expectorated)     Status: None (Preliminary result)   Collection Time: 11/11/14  8:47 AM  Result Value Ref Range Status   Specimen Description BRONCHIAL ALVEOLAR LAVAGE RIGHT  Final   Special Requests PT ON ZOSYN AND ZITHROMAX  Final   Gram Stain   Final    NO WBC SEEN NO SQUAMOUS EPITHELIAL CELLS SEEN NO ORGANISMS SEEN Performed at [facility]  Culture   Final    NO GROWTH 1 DAY Performed at [facility]    Report Status PENDING  Incomplete     Labs: Basic Metabolic Panel:  Recent Labs Lab 11/08/14 1225 11/09/14 0515 11/10/14 0500 11/11/14 0535 11/12/14 0541  NA 136 136 140 138 140  K 3.6 3.3* 3.4* 3.8 3.8  CL 107 106 106 108 108  CO2 18* 22 25 23 23   GLUCOSE 110* 117* 86 78 104*  BUN 6 <5* 6 <5* 5*  CREATININE 1.36* 1.40* 1.49* 1.45* 1.36*  CALCIUM 8.0* 8.0* 8.2* 8.4 8.3*  MG  --   --  1.5  --   --    Liver Function Tests:  Recent Labs Lab 11/05/14 1620  AST 14  ALT 23  ALKPHOS 56  BILITOT 0.4  PROT 6.7  ALBUMIN 2.9*   CBC:  Recent Labs Lab 11/05/14 1620  11/08/14 1225 11/09/14 0515 11/10/14 0500 11/11/14 0535 11/12/14 0541  WBC 13.2*  < > 20.1* 14.4* 10.8* 11.5* 8.3  NEUTROABS 7.8*  --   --   --   --   --   --   HGB 11.3*  < > 10.1* 9.2* 8.6* 10.0* 8.6*  HCT 33.6*  < > 29.9* 27.0* 26.2* 30.1* 25.8*  MCV 88.4  < > 88.2 87.4  87.6 88.0 88.4  PLT 337  < > 246 218 220 266 244  < > = values in this interval not displayed.   IMAGING STUDIES Dg Chest 2 View  11/10/2014   CLINICAL DATA:  Cavitating right upper lobe lesion  EXAM: CHEST  2 VIEW  COMPARISON:  11/08/2014  FINDINGS: Right upper lobe rounded area of infiltrate with cavitation is noted consistent with an evolving lung abscess. The overall appearance is stable from the prior exam. The left lung is clear. The cardiac shadow is stable. No bony abnormality is noted.  IMPRESSION: Overall stable appearance of the cavitary lesion within the right upper lobe.   Electronically Signed   By: Inez Catalina M.D.   On: 11/10/2014 16:57   Dg Chest 2 View  11/08/2014   CLINICAL DATA:  Initial evaluation for fever shortness of breath cough pneumonia for 3 days, patient smokes with history of hypertension  EXAM: CHEST  2 VIEW  COMPARISON:  11/05/2014  FINDINGS: Abnormal rounded opacity right middle and upper lobe with air-fluid level. It measures 8 x 8 cm which is larger than on the recent chest CT. No significant pleural effusion. Left lung remains clear and heart size is normal.  IMPRESSION: Enlarging cavitary consolidative process on the right.   Electronically Signed   By: Skipper Cliche M.D.   On: 11/08/2014 17:24   Dg Chest 2 View  11/05/2014   CLINICAL DATA:  Initial evaluation cough shortness of breath 1 week, smoking history  EXAM: CHEST  2 VIEW  COMPARISON:  05/30/2011  FINDINGS: Heart size and vascular pattern are normal. Left lung is clear. There is abnormal masslike density in the right hilum, measuring about 5 x 5 cm.There is no pleural effusion.  IMPRESSION: Mass-like right perihilar density. Malignancy expected. Contrast-enhanced CT thorax recommended.   Electronically Signed   By: Skipper Cliche M.D.   On: 11/05/2014 12:05   Ct Chest W Contrast  11/05/2014   CLINICAL DATA:  Productive cough. Suspicious mass-like density in the right hilum on recent chest radiograph.   EXAM: CT CHEST WITH CONTRAST  TECHNIQUE: Multidetector CT imaging of the chest was performed during  intravenous contrast administration.  CONTRAST:  33mL OMNIPAQUE IOHEXOL 300 MG/ML  SOLN  COMPARISON:  None.  FINDINGS: Thyroid tissues is diffusely enlarged and mildly heterogeneous. Isthmus measures 2 cm in thickness. Precarinal soft tissue measures 1.3 cm in the short axis on sequence 2, image 27. There is an upper right paratracheal lymph node that measures 0.9 cm in short axis on sequence 2, image 14. Tiny supraclavicular lymph nodes on both sides. There is no significant axillary lymphadenopathy. No significant pericardial or pleural fluid.  Trachea and mainstem bronchi are patent. There is an air-fluid collection in the central right lung which corresponds with the hilar mass on the recent chest radiograph. This lesion roughly measures 4.0 x 3.8 x 3.6 cm. There are small parenchymal densities in the right upper lobe with a tree-in-bud configuration. The central right lung lesion is located in the right upper lobe and bulging into the right minor fissure. Left lung is clear.  There is a large calcified gallstone measuring up to 2.5 cm. Normal appearance of the adrenal glands. No significant lymphadenopathy in the upper abdomen.  No acute bone abnormality.  IMPRESSION: There is a cavitary lesion in the central right upper lobe. This structure measures up to 4 cm and contains both air and fluid. Findings are concerning for a pulmonary abscess. A cavitary lung neoplasm is also in the differential diagnosis. There are small lymph nodes in the mediastinum which are indeterminate.  Parenchymal densities in the right upper lobe with a tree-in-bud configuration. These parenchymal densities are suggestive for an infectious or inflammatory process.  Gallstone.  Thyromegaly of unknown etiology.  These results were called by telephone at the time of interpretation on 11/05/2014 at 3:40 pm to Dr. Alvino Chapel , who verbally  acknowledged these results.   Electronically Signed   By: Markus Daft M.D.   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 No hydronephrosis is evident. The kidney measures 11.4 cm longitudinal. No hydronephrosis is evident. There are unremarkable appearances of the urinary bladder.  IMPRESSION: Crossed fused renal ectopia.  No hydronephrosis.   Electronically Signed   By: Andreas Newport M.D.   On: 11/07/2014 23:49    DISCHARGE EXAMINATION: Filed Vitals:   11/11/14 2050 11/11/14 2106 11/12/14 0454 11/12/14 0847  BP:  127/71 117/66   Pulse: 79 97 81   Temp:  98.6 F (37 C) 98.2 F (36.8 C)   TempSrc:  Oral Oral   Resp: 18 17 18    Height:      Weight:      SpO2: 100% 99% 98% 98%   General appearance: alert, cooperative, appears stated age and no distress Resp: Course breath sounds bilaterally. Few crackles at the bases. No wheezing. Cardio: regular rate and rhythm, S1, S2 normal, no murmur, click, rub or gallop GI: soft, non-tender; bowel sounds normal; no masses,  no organomegaly Extremities: extremities normal, atraumatic, no cyanosis or edema  DISPOSITION: Home  Discharge Instructions    Call MD for:  difficulty breathing, headache or visual disturbances    Complete by:  As directed      Call MD for:  extreme fatigue    Complete by:  As directed      Call MD for:  persistant dizziness or light-headedness    Complete by:  As directed      Call MD for:  persistant nausea and vomiting    Complete by:  As directed      Call MD for:  temperature >100.4    Complete by:  As directed      Diet - low sodium heart healthy    Complete by:  As directed      Discharge instructions    Complete by:  As directed   Please follow up with your providers as instructed.     Increase  activity slowly    Complete by:  As directed            ALLERGIES: No Known Allergies   Current Discharge Medication List    START taking these medications   Details  amoxicillin-clavulanate (AUGMENTIN) 250-62.5 MG/5ML suspension Take 16 mLs (800 mg total) by mouth 3 (three) times daily. For 3 weeks. Qty: 960 mL, Refills: 0    azithromycin (ZITHROMAX) 500 MG tablet Take 1 tablet (500 mg total) by mouth daily. For 3 more days. Qty: 3 tablet, Refills: 0    benzonatate (TESSALON) 200 MG capsule Take 1 capsule (200 mg total) by mouth 3 (three) times daily as needed for cough. Qty: 20 capsule, Refills: 0    ferrous sulfate 325 (65 FE) MG tablet Take 1 tablet (325 mg total) by mouth 2 (two) times daily with a meal. Qty: 60 tablet, Refills: 0    guaiFENesin (MUCINEX) 600 MG 12 hr tablet Take 1 tablet (600 mg total) by mouth 2 (two) times daily. Qty: 60 tablet, Refills: 1    guaifenesin (ROBITUSSIN) 100 MG/5ML syrup Take 10 mLs (200 mg total) by mouth every 4 (four) hours as needed for congestion. Qty: 120 mL, Refills: 0    magnesium oxide (MAG-OX) 400 (241.3 MG) MG tablet Take 1 tablet (400 mg total) by mouth daily. Qty: 15 tablet, Refills: 0    nicotine (NICODERM CQ - DOSED IN MG/24 HOURS) 21 mg/24hr patch Place 1 patch (21 mg total) onto the skin daily. Qty: 28 patch, Refills: 0    oxyCODONE-acetaminophen (PERCOCET/ROXICET) 5-325 MG per tablet Take 1 tablet by mouth every 4 (four) hours as needed for moderate pain or severe pain. Qty: 15 tablet, Refills: 0      CONTINUE these medications which have NOT CHANGED   Details  lisinopril-hydrochlorothiazide (PRINZIDE,ZESTORETIC) 10-12.5 MG per tablet Take 1 tablet by mouth daily.    LORazepam (ATIVAN) 1 MG tablet Take 1 mg by mouth every 6 (six) hours as needed for anxiety.       Follow-up Information    Follow up with Tharon Aquas Trigt III, MD. Schedule an appointment as soon as possible for a visit in 2 weeks.   Specialty:   Cardiothoracic Surgery   Why:  post hospitalization follow up and chest x ray.   Contact information:   [address] [place] [address] [phone]       Follow up with [facility], Karie Schwalbe, RN. Schedule an appointment as soon as possible for a visit in 1 week.   Specialty:  Adult Health Nurse Practitioner   Why:  post hospitalization follow up   Contact information:   [address] [place] [address] [phone]       TOTAL DISCHARGE TIME: 78 minutes  [facility]  [facility] Pager [phone]  11/12/2014, 1:40 PM

## 2014-11-13 LAB — CULTURE, RESPIRATORY: Culture: NO GROWTH

## 2014-11-13 LAB — CULTURE, RESPIRATORY W GRAM STAIN: Gram Stain: NONE SEEN

## 2014-11-16 ENCOUNTER — Encounter (HOSPITAL_COMMUNITY): Payer: Self-pay | Admitting: Cardiothoracic Surgery

## 2014-11-24 ENCOUNTER — Other Ambulatory Visit: Payer: Self-pay | Admitting: Cardiothoracic Surgery

## 2014-11-24 DIAGNOSIS — J852 Abscess of lung without pneumonia: Secondary | ICD-10-CM

## 2014-11-25 ENCOUNTER — Ambulatory Visit: Payer: Self-pay | Admitting: Cardiothoracic Surgery

## 2014-11-26 ENCOUNTER — Telehealth: Payer: Self-pay | Admitting: *Deleted

## 2014-12-03 ENCOUNTER — Ambulatory Visit (HOSPITAL_COMMUNITY)
Admission: RE | Admit: 2014-12-03 | Discharge: 2014-12-03 | Disposition: A | Discharge status: Home / Self Care | Payer: Self-pay | Source: Ambulatory Visit | Attending: Adult Health Nurse Practitioner | Admitting: Adult Health Nurse Practitioner

## 2014-12-03 ENCOUNTER — Other Ambulatory Visit (HOSPITAL_COMMUNITY): Payer: Self-pay | Admitting: Adult Health Nurse Practitioner

## 2014-12-03 DIAGNOSIS — J690 Pneumonitis due to inhalation of food and vomit: Secondary | ICD-10-CM | POA: Insufficient documentation

## 2014-12-03 DIAGNOSIS — R05 Cough: Secondary | ICD-10-CM | POA: Insufficient documentation

## 2014-12-03 DIAGNOSIS — R079 Chest pain, unspecified: Secondary | ICD-10-CM | POA: Insufficient documentation

## 2015-02-03 ENCOUNTER — Ambulatory Visit (HOSPITAL_COMMUNITY)
Admission: RE | Admit: 2015-02-03 | Discharge: 2015-02-03 | Disposition: A | Discharge status: Home / Self Care | Payer: Self-pay | Source: Ambulatory Visit | Attending: Cardiothoracic Surgery | Admitting: Cardiothoracic Surgery

## 2015-02-03 DIAGNOSIS — Z720 Tobacco use: Secondary | ICD-10-CM | POA: Insufficient documentation

## 2015-02-03 DIAGNOSIS — J189 Pneumonia, unspecified organism: Secondary | ICD-10-CM | POA: Insufficient documentation

## 2015-02-03 DIAGNOSIS — J852 Abscess of lung without pneumonia: Secondary | ICD-10-CM

## 2015-03-14 ENCOUNTER — Encounter (HOSPITAL_COMMUNITY): Payer: Self-pay | Admitting: *Deleted

## 2015-03-14 ENCOUNTER — Emergency Department (HOSPITAL_COMMUNITY)
Admission: EM | Admit: 2015-03-14 | Discharge: 2015-03-15 | Disposition: A | Discharge status: Home / Self Care | Payer: Self-pay | Attending: Emergency Medicine | Admitting: Emergency Medicine

## 2015-03-14 DIAGNOSIS — I10 Essential (primary) hypertension: Secondary | ICD-10-CM | POA: Insufficient documentation

## 2015-03-14 DIAGNOSIS — F419 Anxiety disorder, unspecified: Secondary | ICD-10-CM | POA: Insufficient documentation

## 2015-03-14 DIAGNOSIS — M5136 Other intervertebral disc degeneration, lumbar region: Secondary | ICD-10-CM | POA: Insufficient documentation

## 2015-03-14 DIAGNOSIS — G8929 Other chronic pain: Secondary | ICD-10-CM | POA: Insufficient documentation

## 2015-03-14 DIAGNOSIS — Z79899 Other long term (current) drug therapy: Secondary | ICD-10-CM | POA: Insufficient documentation

## 2015-03-14 DIAGNOSIS — M5440 Lumbago with sciatica, unspecified side: Secondary | ICD-10-CM | POA: Insufficient documentation

## 2015-03-14 DIAGNOSIS — Z720 Tobacco use: Secondary | ICD-10-CM | POA: Insufficient documentation

## 2015-03-14 MED ORDER — METHOCARBAMOL 500 MG PO TABS: 500.0000 mg | ORAL_TABLET | Freq: Three times a day (TID) | ORAL | Status: DC

## 2015-03-14 MED ORDER — DIAZEPAM 5 MG PO TABS
10.0000 mg | ORAL_TABLET | Freq: Once | ORAL | Status: AC
  Administered 2015-03-14: 10 mg via ORAL
  Filled 2015-03-14: qty 2

## 2015-03-14 MED ORDER — MELOXICAM 15 MG PO TABS: 15.0000 mg | ORAL_TABLET | Freq: Every day | ORAL | Status: DC

## 2015-03-14 MED ORDER — KETOROLAC TROMETHAMINE 10 MG PO TABS
10.0000 mg | ORAL_TABLET | Freq: Once | ORAL | Status: AC
  Administered 2015-03-14: 10 mg via ORAL
  Filled 2015-03-14: qty 1

## 2015-03-14 MED ORDER — HYDROCODONE-ACETAMINOPHEN 5-325 MG PO TABS: 1.0000 | ORAL_TABLET | ORAL | Status: DC | PRN

## 2015-03-14 MED ORDER — ONDANSETRON HCL 4 MG PO TABS
4.0000 mg | ORAL_TABLET | Freq: Once | ORAL | Status: AC
  Administered 2015-03-14: 4 mg via ORAL
  Filled 2015-03-14: qty 1

## 2015-03-14 MED ORDER — HYDROCODONE-ACETAMINOPHEN 5-325 MG PO TABS
2.0000 | ORAL_TABLET | Freq: Once | ORAL | Status: AC
  Administered 2015-03-14: 2 via ORAL
  Filled 2015-03-14: qty 2

## 2015-03-14 NOTE — ED Provider Notes (Signed)
CSN: [id number]     Arrival date & time 03/14/15  2219 History   First MD Initiated Contact with Patient 03/14/15 2240     Chief Complaint  Patient presents with  . Back Pain     (Consider location/radiation/quality/duration/timing/severity/associated sxs/prior Treatment) Patient is a 39 y.o. female presenting with back pain. The history is provided by the patient.  Back Pain Location:  Lumbar spine Quality:  Aching and shooting Radiates to:  L thigh and R thigh Pain severity:  Severe Pain is:  Same all the time Onset quality:  Gradual Duration: several years. Timing:  Intermittent Progression:  Worsening Chronicity:  New Context comment:  Patient has a history of "herniated disc close quotation marks. She is now having an exacerbation of her chronic pain. Relieved by:  Nothing Worsened by:  Movement and standing Associated symptoms: no bladder incontinence, no bowel incontinence and no perianal numbness   Risk factors: no recent surgery and no steroid use     Past Medical History  Diagnosis Date  . Hypertension   . Anxiety   . Chronic back pain    Past Surgical History  Procedure Laterality Date  . Video bronchoscopy N/A 11/11/2014    Procedure: VIDEO BRONCHOSCOPY;  Surgeon: Ivin Poot, MD;  Location: [facility];  Service: Thoracic;  Laterality: N/A;   History reviewed. No pertinent family history. History  Substance Use Topics  . Smoking status: Current Every Day Smoker -- 1.50 packs/day    Types: Cigarettes  . Smokeless tobacco: Not on file  . Alcohol Use: No   OB History    No data available     Review of Systems  Gastrointestinal: Negative for bowel incontinence.  Genitourinary: Negative for bladder incontinence.  Musculoskeletal: Positive for back pain.  Psychiatric/Behavioral: The patient is nervous/anxious.   All other systems reviewed and are negative.     Allergies  Review of patient's allergies indicates no known allergies.  Home Medications     Prior to Admission medications   Medication Sig Start Date End Date Taking? Authorizing Provider  lisinopril-hydrochlorothiazide (PRINZIDE,ZESTORETIC) 10-12.5 MG per tablet Take 1 tablet by mouth daily.   Yes Historical Provider, MD  amoxicillin-clavulanate (AUGMENTIN) 250-62.5 MG/5ML suspension Take 16 mLs (800 mg total) by mouth 3 (three) times daily. For 3 weeks. Patient not taking: Reported on 03/14/2015 11/12/14   Bonnielee Haff, MD  azithromycin (ZITHROMAX) 500 MG tablet Take 1 tablet (500 mg total) by mouth daily. For 3 more days. Patient not taking: Reported on 03/14/2015 11/12/14   Bonnielee Haff, MD  benzonatate (TESSALON) 200 MG capsule Take 1 capsule (200 mg total) by mouth 3 (three) times daily as needed for cough. Patient not taking: Reported on 03/14/2015 11/12/14   Bonnielee Haff, MD  ferrous sulfate 325 (65 FE) MG tablet Take 1 tablet (325 mg total) by mouth 2 (two) times daily with a meal. Patient not taking: Reported on 03/14/2015 11/12/14   Bonnielee Haff, MD  guaiFENesin (MUCINEX) 600 MG 12 hr tablet Take 1 tablet (600 mg total) by mouth 2 (two) times daily. Patient not taking: Reported on 03/14/2015 11/12/14   Bonnielee Haff, MD  guaifenesin (ROBITUSSIN) 100 MG/5ML syrup Take 10 mLs (200 mg total) by mouth every 4 (four) hours as needed for congestion. Patient not taking: Reported on 03/14/2015 11/12/14   Bonnielee Haff, MD  magnesium oxide (MAG-OX) 400 (241.3 MG) MG tablet Take 1 tablet (400 mg total) by mouth daily. Patient not taking: Reported on 03/14/2015 11/12/14   Gokul  [place] Pink, MD  nicotine (NICODERM CQ - DOSED IN MG/24 HOURS) 21 mg/24hr patch Place 1 patch (21 mg total) onto the skin daily. Patient not taking: Reported on 03/14/2015 11/12/14   Bonnielee Haff, MD  oxyCODONE-acetaminophen (PERCOCET/ROXICET) 5-325 MG per tablet Take 1 tablet by mouth every 4 (four) hours as needed for moderate pain or severe pain. Patient not taking: Reported on 03/14/2015 11/12/14   Bonnielee Haff, MD   BP 148/92 mmHg  Pulse 95  Temp(Src) 98.5 F (36.9 C) (Oral)  Resp 20  Ht 5' 3.5" (1.613 m)  Wt 170 lb (77.111 kg)  BMI 29.64 kg/m2  SpO2 99%  LMP 03/07/2015 Physical Exam  Constitutional: She is oriented to person, place, and time. She appears well-developed and well-nourished.  Non-toxic appearance.  HENT:  Head: Normocephalic.  Right Ear: Tympanic membrane and external ear normal.  Left Ear: Tympanic membrane and external ear normal.  Eyes: EOM and lids are normal. Pupils are equal, round, and reactive to light.  Neck: Normal range of motion. Neck supple. Carotid bruit is not present.  Cardiovascular: Normal rate, regular rhythm, normal heart sounds, intact distal pulses and normal pulses.   Pulmonary/Chest: Breath sounds normal. No respiratory distress.  Abdominal: Soft. Bowel sounds are normal. There is no tenderness. There is no guarding.  Musculoskeletal:       Lumbar back: She exhibits decreased range of motion, tenderness, bony tenderness, pain and spasm.       Back:  Lymphadenopathy:       Head (right side): No submandibular adenopathy present.       Head (left side): No submandibular adenopathy present.    She has no cervical adenopathy.  Neurological: She is alert and oriented to person, place, and time. She has normal strength. No cranial nerve deficit or sensory deficit.  Skin: Skin is warm and dry.  Psychiatric: She has a normal mood and affect. Her speech is normal.  Nursing note and vitals reviewed.   ED Course  Procedures (including critical care time) Labs Review Labs Reviewed - No data to display  Imaging Review No results found.   EKG Interpretation None      MDM  Vital signs are well within normal limits. Pulse oximetry is 99% on room air. No gross neurologic deficits appreciated on examination at this time. Suspect acute exacerbation of patient's chronic degenerative disc problem. The patient is advised to see her primary  physician for additional pain management. Prescription for short course of Norco meloxicam, and Robaxin given to the patient.    Final diagnoses:  None    *I have reviewed nursing notes, vital signs, and all appropriate lab and imaging results for this patient.[address], PA-C 03/14/15 2358  Noemi Chapel, MD 03/15/15 [phone]

## 2015-03-14 NOTE — Discharge Instructions (Signed)
Please see Ms Margaret Yoder as soon as possible for assistance with your pain management. Back Pain, Adult Back pain is very common. The pain often gets better over time. The cause of back pain is usually not dangerous. Most people can learn to manage their back pain on their own.  HOME CARE   Stay active. Start with short walks on flat ground if you can. Try to walk farther each day.  Do not sit, drive, or stand in one place for more than 30 minutes. Do not stay in bed.  Do not avoid exercise or work. Activity can help your back heal faster.  Be careful when you bend or lift an object. Bend at your knees, keep the object close to you, and do not twist.  Sleep on a firm mattress. Lie on your side, and bend your knees. If you lie on your back, put a pillow under your knees.  Only take medicines as told by your doctor.  Put ice on the injured area.  Put ice in a plastic bag.  Place a towel between your skin and the bag.  Leave the ice on for 15-20 minutes, 03-04 times a day for the first 2 to 3 days. After that, you can switch between ice and heat packs.  Ask your doctor about back exercises or massage.  Avoid feeling anxious or stressed. Find good ways to deal with stress, such as exercise. GET HELP RIGHT AWAY IF:   Your pain does not go away with rest or medicine.  Your pain does not go away in 1 week.  You have new problems.  You do not feel well.  The pain spreads into your legs.  You cannot control when you poop (bowel movement) or pee (urinate).  Your arms or legs feel weak or lose feeling (numbness).  You feel sick to your stomach (nauseous) or throw up (vomit).  You have belly (abdominal) pain.  You feel like you may pass out (faint). MAKE SURE YOU:   Understand these instructions.  Will watch your condition.  Will get help right away if you are not doing well or get worse. Document Released: 01/24/2008 Document Revised: 10/30/2011 Document Reviewed:  12/09/2013 [facility] Patient Information 2015 [place], [place]. This information is not intended to replace advice given to you by your health care provider. Make sure you discuss any questions you have with your health care provider.

## 2015-03-14 NOTE — ED Notes (Signed)
Pt c/o lower back pain that radiates down both legs since this morning. Pt states she has a ruptured disc

## 2015-05-16 ENCOUNTER — Encounter (HOSPITAL_COMMUNITY): Payer: Self-pay | Admitting: Emergency Medicine

## 2015-05-16 ENCOUNTER — Emergency Department (HOSPITAL_COMMUNITY): Payer: Self-pay

## 2015-05-16 ENCOUNTER — Emergency Department (HOSPITAL_COMMUNITY)
Admission: EM | Admit: 2015-05-16 | Discharge: 2015-05-16 | Disposition: A | Discharge status: Home / Self Care | Payer: Self-pay | Attending: Emergency Medicine | Admitting: Emergency Medicine

## 2015-05-16 DIAGNOSIS — G8929 Other chronic pain: Secondary | ICD-10-CM | POA: Insufficient documentation

## 2015-05-16 DIAGNOSIS — Z79899 Other long term (current) drug therapy: Secondary | ICD-10-CM | POA: Insufficient documentation

## 2015-05-16 DIAGNOSIS — F329 Major depressive disorder, single episode, unspecified: Secondary | ICD-10-CM | POA: Insufficient documentation

## 2015-05-16 DIAGNOSIS — I10 Essential (primary) hypertension: Secondary | ICD-10-CM | POA: Insufficient documentation

## 2015-05-16 DIAGNOSIS — F419 Anxiety disorder, unspecified: Secondary | ICD-10-CM | POA: Insufficient documentation

## 2015-05-16 DIAGNOSIS — M79671 Pain in right foot: Secondary | ICD-10-CM | POA: Insufficient documentation

## 2015-05-16 DIAGNOSIS — Z720 Tobacco use: Secondary | ICD-10-CM | POA: Insufficient documentation

## 2015-05-16 HISTORY — DX: Gout, unspecified: M10.9

## 2015-05-16 HISTORY — DX: Major depressive disorder, single episode, unspecified: F32.9

## 2015-05-16 HISTORY — DX: Depression, unspecified: F32.A

## 2015-05-16 MED ORDER — HYDROCODONE-ACETAMINOPHEN 5-325 MG PO TABS: 1.0000 | ORAL_TABLET | ORAL | Status: DC | PRN

## 2015-05-16 MED ORDER — INDOMETHACIN 25 MG PO CAPS: 25.0000 mg | ORAL_CAPSULE | Freq: Three times a day (TID) | ORAL | Status: DC | PRN

## 2015-05-16 MED ORDER — HYDROCODONE-ACETAMINOPHEN 5-325 MG PO TABS
2.0000 | ORAL_TABLET | Freq: Once | ORAL | Status: AC
  Administered 2015-05-16: 2 via ORAL
  Filled 2015-05-16: qty 2

## 2015-05-16 NOTE — ED Notes (Signed)
Pt states that she has been having right foot pain that worsened last night.  Has hx of gout and feels the same.

## 2015-05-16 NOTE — ED Notes (Signed)
PA at bedside for evaluation

## 2015-05-16 NOTE — Discharge Instructions (Signed)

## 2015-05-16 NOTE — ED Provider Notes (Signed)
History  This chart was scribed for non-physician practitioner, Alyse Low, PA-C,working with Ezequiel Essex, MD, by Marlowe Kays, ED Scribe. This patient was seen in room APFT23/APFT23 and the patient's care was started at 12:03 PM.  Chief Complaint  Patient presents with  . Foot Pain   The history is provided by the patient and medical records. No language interpreter was used.    HPI Comments:  Margaret Yoder is a 39 y.o. female who presents to the Emergency Department complaining of severe right foot pain that began worsening last night. She reports some mild swelling of the foot. She states she has had gout in the past but has never experienced the swelling. She has not done anything to treat the pain. She denies alleviating factors but reports touching the area intensifies the pain. She denies numbness, tingling or weakness of the right foot, fever, chills, nausea or vomiting. She denies trauma, injury or fall. Pt is on Lisinopril but denies taking the combination that contains HCTZ. She states she is out of work at this time.  Past Medical History  Diagnosis Date  . Hypertension   . Anxiety   . Chronic back pain   . Gout   . Depression    Past Surgical History  Procedure Laterality Date  . Video bronchoscopy N/A 11/11/2014    Procedure: VIDEO BRONCHOSCOPY;  Surgeon: Ivin Poot, MD;  Location: [place];  Service: Thoracic;  Laterality: N/A;   History reviewed. No pertinent family history. Social History  Substance Use Topics  . Smoking status: Current Every Day Smoker -- 1.00 packs/day    Types: Cigarettes  . Smokeless tobacco: None  . Alcohol Use: No   OB History    No data available     Review of Systems  Constitutional: Negative for fever and chills.  Gastrointestinal: Negative for nausea and vomiting.  Musculoskeletal: Positive for arthralgias.  Skin: Positive for color change (mild erythema). Negative for wound.  Neurological: Negative for weakness  and numbness.  All other systems reviewed and are negative.   Allergies  Review of patient's allergies indicates no known allergies.  Home Medications   Prior to Admission medications   Medication Sig Start Date End Date Taking? Authorizing Provider  FLUoxetine (PROZAC) 10 MG capsule Take 2 capsules by mouth daily. 05/04/15 05/03/16 Yes Historical Provider, MD  lisinopril (PRINIVIL,ZESTRIL) 10 MG tablet Take 10 mg by mouth daily. 05/04/15 05/03/16 Yes Historical Provider, MD  LORazepam (ATIVAN) 1 MG tablet Take 1 mg by mouth every 8 (eight) hours as needed for anxiety.   Yes Historical Provider, MD  HYDROcodone-acetaminophen (NORCO/VICODIN) 5-325 MG per tablet Take 1 tablet by mouth every 4 (four) hours as needed. 05/16/15   Fransico Meadow, PA-C  indomethacin (INDOCIN) 25 MG capsule Take 1 capsule (25 mg total) by mouth 3 (three) times daily as needed. 05/16/15   Fransico Meadow, PA-C  meloxicam (MOBIC) 15 MG tablet Take 1 tablet (15 mg total) by mouth daily. Patient not taking: Reported on 05/16/2015 03/14/15   Lily Kocher, PA-C   Triage Vitals: BP 154/91 mmHg  Pulse 88  Temp(Src) 97.6 F (36.4 C) (Oral)  Resp 16  Ht 5\' 3"  (1.6 m)  Wt 160 lb (72.576 kg)  BMI 28.35 kg/m2  SpO2 100%  LMP 05/02/2015 Physical Exam  Constitutional: She is oriented to person, place, and time. She appears well-developed and well-nourished.  HENT:  Head: Normocephalic and atraumatic.  Eyes: EOM are normal.  Neck: Normal range of  motion.  Cardiovascular: Normal rate.   Pulmonary/Chest: Effort normal.  Musculoskeletal: Normal range of motion. She exhibits tenderness.  Swollen, slightly erythematous right foot. Tender mid foot.   Neurological: She is alert and oriented to person, place, and time.  NVI and neurosensory intact  Skin: Skin is warm and dry. There is erythema (mildly on right dorsal foot).  Psychiatric: She has a normal mood and affect. Her behavior is normal.  Nursing note and vitals  reviewed.   ED Course  Procedures (including critical care time) DIAGNOSTIC STUDIES: Oxygen Saturation is 100% on RA, normal by my interpretation.   COORDINATION OF CARE: 12:04 PM- Will X-Ray right foot and order pain medication. Pt verbalizes understanding and agrees to plan.  Medications  HYDROcodone-acetaminophen (NORCO/VICODIN) 5-325 MG per tablet 2 tablet (2 tablets Oral Given 05/16/15 1209)   Labs Review Labs Reviewed - No data to display  Imaging Review Dg Foot Complete Right  05/16/2015   CLINICAL DATA:  Right foot pain/ swelling, history of gout  EXAM: RIGHT FOOT COMPLETE - 3+ VIEW  COMPARISON:  None.  FINDINGS: No fracture or dislocation is seen.  The joint spaces are preserved.  No marginal erosions.  The visualized soft tissues are unremarkable.  IMPRESSION: No acute osseus abnormality is seen.   Electronically Signed   By: Julian Hy M.D.   On: 05/16/2015 12:28   I have personally reviewed and evaluated these images and lab results as part of my medical decision-making.   EKG Interpretation None      MDM   Final diagnoses:  Foot pain, right    indomethocin Hydrocodone See your Md for recheck in 3-4 days  I personally performed the services in this documentation, which was scribed in my presence.  The recorded information has been reviewed and considered.   Ronnald Collum.    Hollace Kinnier [place], PA-C 05/16/15 [place], MD 05/16/15 [phone]

## 2016-03-09 ENCOUNTER — Encounter (HOSPITAL_COMMUNITY): Payer: Self-pay | Admitting: Emergency Medicine

## 2016-03-09 ENCOUNTER — Emergency Department (HOSPITAL_COMMUNITY)
Admission: EM | Admit: 2016-03-09 | Discharge: 2016-03-09 | Disposition: A | Discharge status: Home / Self Care | Payer: No Typology Code available for payment source | Attending: Emergency Medicine | Admitting: Emergency Medicine

## 2016-03-09 ENCOUNTER — Emergency Department (HOSPITAL_COMMUNITY): Payer: No Typology Code available for payment source

## 2016-03-09 DIAGNOSIS — Z79899 Other long term (current) drug therapy: Secondary | ICD-10-CM | POA: Diagnosis not present

## 2016-03-09 DIAGNOSIS — Y999 Unspecified external cause status: Secondary | ICD-10-CM | POA: Insufficient documentation

## 2016-03-09 DIAGNOSIS — F329 Major depressive disorder, single episode, unspecified: Secondary | ICD-10-CM | POA: Diagnosis not present

## 2016-03-09 DIAGNOSIS — Y9389 Activity, other specified: Secondary | ICD-10-CM | POA: Diagnosis not present

## 2016-03-09 DIAGNOSIS — S93401A Sprain of unspecified ligament of right ankle, initial encounter: Secondary | ICD-10-CM | POA: Diagnosis not present

## 2016-03-09 DIAGNOSIS — Z791 Long term (current) use of non-steroidal anti-inflammatories (NSAID): Secondary | ICD-10-CM | POA: Diagnosis not present

## 2016-03-09 DIAGNOSIS — I10 Essential (primary) hypertension: Secondary | ICD-10-CM | POA: Insufficient documentation

## 2016-03-09 DIAGNOSIS — S99912A Unspecified injury of left ankle, initial encounter: Secondary | ICD-10-CM | POA: Diagnosis present

## 2016-03-09 DIAGNOSIS — F17210 Nicotine dependence, cigarettes, uncomplicated: Secondary | ICD-10-CM | POA: Insufficient documentation

## 2016-03-09 DIAGNOSIS — Y92410 Unspecified street and highway as the place of occurrence of the external cause: Secondary | ICD-10-CM | POA: Insufficient documentation

## 2016-03-09 DIAGNOSIS — S93402A Sprain of unspecified ligament of left ankle, initial encounter: Secondary | ICD-10-CM

## 2016-03-09 LAB — POC URINE PREG, ED: Preg Test, Ur: NEGATIVE

## 2016-03-09 MED ORDER — HYDROCODONE-ACETAMINOPHEN 5-325 MG PO TABS
1.0000 | ORAL_TABLET | Freq: Once | ORAL | Status: AC
  Administered 2016-03-09: 1 via ORAL
  Filled 2016-03-09: qty 1

## 2016-03-09 MED ORDER — OXYCODONE-ACETAMINOPHEN 5-325 MG PO TABS: 1.0000 | ORAL_TABLET | ORAL | Status: DC | PRN

## 2016-03-09 MED ORDER — LISINOPRIL 10 MG PO TABS
10.0000 mg | ORAL_TABLET | Freq: Once | ORAL | Status: AC
  Administered 2016-03-09: 10 mg via ORAL
  Filled 2016-03-09: qty 1

## 2016-03-09 MED ORDER — IBUPROFEN 600 MG PO TABS: 600.0000 mg | ORAL_TABLET | Freq: Four times a day (QID) | ORAL | Status: DC | PRN

## 2016-03-09 MED ORDER — KETOROLAC TROMETHAMINE 60 MG/2ML IM SOLN
60.0000 mg | Freq: Once | INTRAMUSCULAR | Status: AC
  Administered 2016-03-09: 60 mg via INTRAMUSCULAR
  Filled 2016-03-09: qty 2
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
## 2016-06-12 ENCOUNTER — Ambulatory Visit (HOSPITAL_COMMUNITY)
Admission: RE | Admit: 2016-06-12 | Discharge: 2016-06-12 | Disposition: A | Discharge status: Home / Self Care | Payer: Self-pay | Source: Ambulatory Visit | Attending: Adult Health Nurse Practitioner | Admitting: Adult Health Nurse Practitioner

## 2016-06-12 ENCOUNTER — Other Ambulatory Visit (HOSPITAL_COMMUNITY): Payer: Self-pay | Admitting: Adult Health Nurse Practitioner

## 2016-06-12 DIAGNOSIS — M79672 Pain in left foot: Secondary | ICD-10-CM | POA: Insufficient documentation

## 2016-06-12 DIAGNOSIS — G8929 Other chronic pain: Secondary | ICD-10-CM | POA: Insufficient documentation

## 2016-06-12 DIAGNOSIS — M25552 Pain in left hip: Secondary | ICD-10-CM

## 2016-06-12 DIAGNOSIS — M25561 Pain in right knee: Secondary | ICD-10-CM

## 2016-06-12 DIAGNOSIS — M25562 Pain in left knee: Secondary | ICD-10-CM

## 2016-06-12 DIAGNOSIS — M5126 Other intervertebral disc displacement, lumbar region: Secondary | ICD-10-CM

## 2018-05-29 ENCOUNTER — Observation Stay (HOSPITAL_COMMUNITY)
Admission: EM | Admit: 2018-05-29 | Discharge: 2018-05-30 | Disposition: A | Discharge status: Home / Self Care | Payer: Self-pay | Attending: Internal Medicine | Admitting: Internal Medicine

## 2018-05-29 ENCOUNTER — Encounter (HOSPITAL_COMMUNITY): Payer: Self-pay | Admitting: *Deleted

## 2018-05-29 ENCOUNTER — Other Ambulatory Visit: Payer: Self-pay

## 2018-05-29 DIAGNOSIS — D631 Anemia in chronic kidney disease: Secondary | ICD-10-CM | POA: Insufficient documentation

## 2018-05-29 DIAGNOSIS — F329 Major depressive disorder, single episode, unspecified: Secondary | ICD-10-CM | POA: Insufficient documentation

## 2018-05-29 DIAGNOSIS — L0390 Cellulitis, unspecified: Secondary | ICD-10-CM | POA: Diagnosis present

## 2018-05-29 DIAGNOSIS — L02414 Cutaneous abscess of left upper limb: Secondary | ICD-10-CM | POA: Insufficient documentation

## 2018-05-29 DIAGNOSIS — F17210 Nicotine dependence, cigarettes, uncomplicated: Secondary | ICD-10-CM | POA: Insufficient documentation

## 2018-05-29 DIAGNOSIS — I10 Essential (primary) hypertension: Secondary | ICD-10-CM | POA: Diagnosis present

## 2018-05-29 DIAGNOSIS — N179 Acute kidney failure, unspecified: Secondary | ICD-10-CM

## 2018-05-29 DIAGNOSIS — L03114 Cellulitis of left upper limb: Principal | ICD-10-CM | POA: Diagnosis present

## 2018-05-29 DIAGNOSIS — L0291 Cutaneous abscess, unspecified: Secondary | ICD-10-CM

## 2018-05-29 DIAGNOSIS — N183 Chronic kidney disease, stage 3 (moderate): Secondary | ICD-10-CM | POA: Insufficient documentation

## 2018-05-29 DIAGNOSIS — F909 Attention-deficit hyperactivity disorder, unspecified type: Secondary | ICD-10-CM | POA: Insufficient documentation

## 2018-05-29 DIAGNOSIS — Z79899 Other long term (current) drug therapy: Secondary | ICD-10-CM | POA: Insufficient documentation

## 2018-05-29 DIAGNOSIS — I129 Hypertensive chronic kidney disease with stage 1 through stage 4 chronic kidney disease, or unspecified chronic kidney disease: Secondary | ICD-10-CM | POA: Insufficient documentation

## 2018-05-29 DIAGNOSIS — F419 Anxiety disorder, unspecified: Secondary | ICD-10-CM | POA: Insufficient documentation

## 2018-05-29 LAB — CBC
HEMATOCRIT: 37.9 % (ref 36.0–46.0)
HEMOGLOBIN: 12 g/dL (ref 12.0–15.0)
MCH: 29.3 pg (ref 26.0–34.0)
MCHC: 31.7 g/dL (ref 30.0–36.0)
MCV: 92.4 fL (ref 80.0–100.0)
NRBC: 0 % (ref 0.0–0.2)
Platelets: 202 10*3/uL (ref 150–400)
RBC: 4.1 MIL/uL (ref 3.87–5.11)
RDW: 12.7 % (ref 11.5–15.5)
WBC: 11.7 10*3/uL — AB (ref 4.0–10.5)

## 2018-05-29 LAB — COMPREHENSIVE METABOLIC PANEL
ALT: 17 U/L (ref 0–44)
AST: 20 U/L (ref 15–41)
Albumin: 3.3 g/dL — ABNORMAL LOW (ref 3.5–5.0)
Alkaline Phosphatase: 80 U/L (ref 38–126)
Anion gap: 9 (ref 5–15)
BUN: 27 mg/dL — ABNORMAL HIGH (ref 6–20)
CHLORIDE: 106 mmol/L (ref 98–111)
CO2: 22 mmol/L (ref 22–32)
CREATININE: 2.77 mg/dL — AB (ref 0.44–1.00)
Calcium: 8.7 mg/dL — ABNORMAL LOW (ref 8.9–10.3)
GFR, EST AFRICAN AMERICAN: 23 mL/min — AB (ref >60)
GFR, EST NON AFRICAN AMERICAN: 20 mL/min — AB (ref >60)
Glucose, Bld: 92 mg/dL (ref 70–99)
POTASSIUM: 4.3 mmol/L (ref 3.5–5.1)
Sodium: 137 mmol/L (ref 135–145)
Total Bilirubin: 0.6 mg/dL (ref 0.3–1.2)
Total Protein: 7.2 g/dL (ref 6.5–8.1)

## 2018-05-29 LAB — URINALYSIS, ROUTINE W REFLEX MICROSCOPIC
Bilirubin Urine: NEGATIVE
Glucose, UA: NEGATIVE mg/dL
Hgb urine dipstick: NEGATIVE
Ketones, ur: NEGATIVE mg/dL
LEUKOCYTES UA: NEGATIVE
NITRITE: NEGATIVE
PH: 5 (ref 5.0–8.0)
Protein, ur: NEGATIVE mg/dL
SPECIFIC GRAVITY, URINE: 1.008 (ref 1.005–1.030)

## 2018-05-29 LAB — RAPID URINE DRUG SCREEN, HOSP PERFORMED
AMPHETAMINES: NOT DETECTED
Barbiturates: NOT DETECTED
Benzodiazepines: POSITIVE — AB
Cocaine: NOT DETECTED
OPIATES: POSITIVE — AB
TETRAHYDROCANNABINOL: POSITIVE — AB

## 2018-05-29 LAB — SODIUM, URINE, RANDOM: SODIUM UR: 58 mmol/L

## 2018-05-29 LAB — I-STAT CG4 LACTIC ACID, ED
LACTIC ACID, VENOUS: 0.83 mmol/L (ref 0.5–1.9)
Lactic Acid, Venous: 0.71 mmol/L (ref 0.5–1.9)

## 2018-05-29 LAB — CREATININE, URINE, RANDOM: CREATININE, URINE: 62.66 mg/dL

## 2018-05-29 LAB — I-STAT BETA HCG BLOOD, ED (MC, WL, AP ONLY): I-stat hCG, quantitative: <5 m[IU]/mL (ref <5)

## 2018-05-29 LAB — PREGNANCY, URINE: PREG TEST UR: NEGATIVE

## 2018-05-29 MED ORDER — CLONAZEPAM 0.5 MG PO TABS
2.0000 mg | ORAL_TABLET | Freq: Once | ORAL | Status: AC
  Administered 2018-05-29: 2 mg via ORAL
  Filled 2018-05-29: qty 4

## 2018-05-29 MED ORDER — MORPHINE SULFATE (PF) 4 MG/ML IV SOLN
4.0000 mg | Freq: Once | INTRAVENOUS | Status: AC
  Administered 2018-05-29: 4 mg via INTRAVENOUS
  Filled 2018-05-29: qty 1

## 2018-05-29 MED ORDER — CLONAZEPAM 1 MG PO TABS
2.0000 mg | ORAL_TABLET | Freq: Two times a day (BID) | ORAL | Status: DC
  Administered 2018-05-30: 2 mg via ORAL
  Filled 2018-05-29: qty 2

## 2018-05-29 MED ORDER — ACETAMINOPHEN 325 MG PO TABS: 650.0000 mg | ORAL_TABLET | Freq: Four times a day (QID) | ORAL | Status: DC | PRN

## 2018-05-29 MED ORDER — AMPHETAMINE-DEXTROAMPHET ER 5 MG PO CP24
25.0000 mg | ORAL_CAPSULE | ORAL | Status: DC
  Administered 2018-05-30: 25 mg via ORAL
  Filled 2018-05-29: qty 1

## 2018-05-29 MED ORDER — ONDANSETRON HCL 4 MG PO TABS: 4.0000 mg | ORAL_TABLET | Freq: Four times a day (QID) | ORAL | Status: DC | PRN

## 2018-05-29 MED ORDER — ONDANSETRON HCL 4 MG/2ML IJ SOLN: 4.0000 mg | Freq: Four times a day (QID) | INTRAMUSCULAR | Status: DC | PRN

## 2018-05-29 MED ORDER — MORPHINE SULFATE (PF) 2 MG/ML IV SOLN
1.0000 mg | INTRAVENOUS | Status: DC | PRN
  Administered 2018-05-29 – 2018-05-30 (×2): 1 mg via INTRAVENOUS
  Filled 2018-05-29 (×2): qty 1

## 2018-05-29 MED ORDER — ACETAMINOPHEN 650 MG RE SUPP: 650.0000 mg | Freq: Four times a day (QID) | RECTAL | Status: DC | PRN

## 2018-05-29 MED ORDER — SODIUM CHLORIDE 0.9 % IV BOLUS
1000.0000 mL | Freq: Once | INTRAVENOUS | Status: AC
  Administered 2018-05-29: 1000 mL via INTRAVENOUS

## 2018-05-29 MED ORDER — AMPHETAMINE-DEXTROAMPHET ER 25 MG PO CP24: 25.0000 mg | ORAL_CAPSULE | ORAL | Status: DC

## 2018-05-29 MED ORDER — FLUOXETINE HCL 20 MG PO CAPS: 40.0000 mg | ORAL_CAPSULE | ORAL | Status: DC

## 2018-05-29 MED ORDER — HYDRALAZINE HCL 20 MG/ML IJ SOLN: 10.0000 mg | INTRAMUSCULAR | Status: DC | PRN

## 2018-05-29 MED ORDER — VANCOMYCIN HCL IN DEXTROSE 1-5 GM/200ML-% IV SOLN: 1000.0000 mg | INTRAVENOUS | Status: DC

## 2018-05-29 MED ORDER — SODIUM CHLORIDE 0.9 % IV SOLN
INTRAVENOUS | Status: DC
  Administered 2018-05-29 – 2018-05-30 (×2): via INTRAVENOUS

## 2018-05-29 MED ORDER — LIDOCAINE-EPINEPHRINE (PF) 2 %-1:200000 IJ SOLN
20.0000 mL | Freq: Once | INTRAMUSCULAR | Status: AC
  Administered 2018-05-29: 20 mL
  Filled 2018-05-29: qty 20

## 2018-05-29 MED ORDER — VANCOMYCIN HCL 10 G IV SOLR
1750.0000 mg | Freq: Once | INTRAVENOUS | Status: AC
  Administered 2018-05-29: 1750 mg via INTRAVENOUS
  Filled 2018-05-29: qty 1750

## 2018-05-29 NOTE — ED Triage Notes (Signed)
The pt is c/o a bug bite 4 days ago to the lt a-c.  She was working in her [place] swollen  lmp  2 weeks ago

## 2018-05-29 NOTE — ED Notes (Signed)
Vancomycin still infusing family at the bedside

## 2018-05-29 NOTE — Progress Notes (Signed)
Pt arrived to floor from ED. Pt alert and oriented x4. Complained of left arm pain (8/10) PRN Morphine given. Oriented to room and call bell.

## 2018-05-29 NOTE — H&P (Signed)
History and Physical    Margaret Yoder FMB:[id number] DOB: [birth year] DOA: 05/29/2018  PCP: Dorrene German, NP  Patient coming from: Home.  Chief Complaint: Left elbow pain and swelling.  HPI: Margaret Yoder is a 42 y.o. female with history of hypertension, depression, anxiety, ADHD, chronic kidney disease stage III and chronic pain presents to the ER because of worsening swelling and pain involving the left elbow.  Patient symptoms started 4 days ago when patient states she was in her garage and felt like some insect bite onto her left elbow since then the swelling is been worsening with increasing erythema and pain.  Had gone to her primary care physician yesterday and was prescribed Keflex which patient has not taken.  Due to worsening pain and swelling patient presented to the ER.  Denies any fever chills nausea vomiting diarrhea.  ED Course: In the ER on exam patient has significant swelling of the left antecubital fossa and ER physician had incised and drained the abscess.  Patient has some restriction of mobility of the elbow and the swelling involves the distal arm and proximal forearm.  Able to make a fist of her arm.  Patient is afebrile.  Patient's creatinine has significantly increased from previously.  The only creatinine available in the records from 2016 which showed a creatinine 1.36 at the time patient had a GFR equal to chronic kidney disease stage III.  Patient admits to taking NSAIDs for pain and has been taking more frequently recently.  Patient is also on ARB and HCTZ for hypertension.  Patient was given fluid bolus and started on antibiotics for cellulitis and renal failure.  Review of Systems: As per HPI, rest all negative.   Past Medical History:  Diagnosis Date  . Anxiety   . Chronic back pain   . Depression   . Gout   . Hypertension     Past Surgical History:  Procedure Laterality Date  . VIDEO BRONCHOSCOPY N/A 11/11/2014   Procedure: VIDEO BRONCHOSCOPY;   Surgeon: Ivin Poot, MD;  Location: [facility] OR;  Service: Thoracic;  Laterality: N/A;     reports that she has been smoking cigarettes. She has been smoking about 1.00 pack per day. She has never used smokeless tobacco. She reports that she does not drink alcohol or use drugs.  No Known Allergies  Family History  Family history unknown: Yes    Prior to Admission medications   Medication Sig Start Date End Date Taking? Authorizing Provider  amphetamine-dextroamphetamine (ADDERALL XR) 25 MG 24 hr capsule Take 25 mg by mouth every morning. 08/02/17  Yes [provider]  clonazePAM (KLONOPIN) 2 MG tablet Take 2 mg by mouth 2 (two) times daily.   Yes [provider]  FLUoxetine (PROZAC) 20 MG capsule Take 40 mg by mouth every morning.   Yes [provider]  ibuprofen (ADVIL,MOTRIN) 600 MG tablet Take 1 tablet (600 mg total) by mouth every 6 (six) hours as needed. Patient not taking: Reported on 05/29/2018 03/09/16   Evalee Jefferson, PA-C  oxyCODONE-acetaminophen (PERCOCET/ROXICET) 5-325 MG tablet Take 1 tablet by mouth every 4 (four) hours as needed. Patient not taking: Reported on 05/29/2018 03/09/16   Evalee Jefferson, PA-C    Physical Exam: Vitals:   05/29/18 1830 05/29/18 1900 05/29/18 2000 05/29/18 2231  BP: 115/78 109/66 (!) 116/48 139/62  Pulse: 95 94 89 90  Resp:   16 18  Temp:    98.7 F (37.1 C)  TempSrc:  Oral  SpO2: 99% 100% 100% 99%  Weight:      Height:          Constitutional: Moderately built and nourished. Vitals:   05/29/18 1830 05/29/18 1900 05/29/18 2000 05/29/18 2231  BP: 115/78 109/66 (!) 116/48 139/62  Pulse: 95 94 89 90  Resp:   16 18  Temp:    98.7 F (37.1 C)  TempSrc:    Oral  SpO2: 99% 100% 100% 99%  Weight:      Height:       Eyes: Anicteric no pallor. ENMT: No discharge from the ears eyes nose or mouth. Neck: No mass felt.  No neck rigidity. Respiratory: No rhonchi or crepitations. Cardiovascular: S1-S2 heard no murmurs  appreciated. Abdomen: Soft nontender bowel sounds present. Musculoskeletal: Swelling of the left elbow with erythema involving the distal on and proximal forearm.  Has mild restriction of the elbow movements. Skin: Erythema of the left elbow. Neurologic: Alert awake oriented to time place and person.  Moves all extremities. Psychiatric: Appears normal per normal affect.   Labs on Admission: I have personally reviewed following labs and imaging studies  CBC: Recent Labs  Lab 05/29/18 1614  WBC 11.7*  HGB 12.0  HCT 37.9  MCV 92.4  PLT 833   Basic Metabolic Panel: Recent Labs  Lab 05/29/18 1614  NA 137  K 4.3  CL 106  CO2 22  GLUCOSE 92  BUN 27*  CREATININE 2.77*  CALCIUM 8.7*   GFR: Estimated Creatinine Clearance: 28.4 mL/min (A) (by C-G formula based on SCr of 2.77 mg/dL (H)). Liver Function Tests: Recent Labs  Lab 05/29/18 1614  AST 20  ALT 17  ALKPHOS 80  BILITOT 0.6  PROT 7.2  ALBUMIN 3.3*   No results for input(s): LIPASE, AMYLASE in the last 168 hours. No results for input(s): AMMONIA in the last 168 hours. Coagulation Profile: No results for input(s): INR, PROTIME in the last 168 hours. Cardiac Enzymes: No results for input(s): CKTOTAL, CKMB, CKMBINDEX, TROPONINI in the last 168 hours. BNP (last 3 results) No results for input(s): PROBNP in the last 8760 hours. HbA1C: No results for input(s): HGBA1C in the last 72 hours. CBG: No results for input(s): GLUCAP in the last 168 hours. Lipid Profile: No results for input(s): CHOL, HDL, LDLCALC, TRIG, CHOLHDL, LDLDIRECT in the last 72 hours. Thyroid Function Tests: No results for input(s): TSH, T4TOTAL, FREET4, T3FREE, THYROIDAB in the last 72 hours. Anemia Panel: No results for input(s): VITAMINB12, FOLATE, FERRITIN, TIBC, IRON, RETICCTPCT in the last 72 hours. Urine analysis:    Component Value Date/Time   COLORURINE YELLOW 11/05/2014 1800   APPEARANCEUR CLEAR 11/05/2014 1800   LABSPEC 1.020  11/05/2014 1800   PHURINE 5.5 11/05/2014 1800   GLUCOSEU NEGATIVE 11/05/2014 1800   HGBUR NEGATIVE 11/05/2014 1800   BILIRUBINUR NEGATIVE 11/05/2014 1800   KETONESUR NEGATIVE 11/05/2014 1800   PROTEINUR NEGATIVE 11/05/2014 1800   UROBILINOGEN 0.2 11/05/2014 1800   NITRITE NEGATIVE 11/05/2014 1800   LEUKOCYTESUR NEGATIVE 11/05/2014 1800   Sepsis Labs: @LABRCNTIP (procalcitonin:4,lacticidven:4) )No results found for this or any previous visit (from the past 240 hour(s)).   Radiological Exams on Admission: No results found.    Assessment/Plan Principal Problem:   Cellulitis of left elbow Active Problems:   Hypertension   Cellulitis   AKI (acute kidney injury) ([place])   Abscess    1. Left elbow cellulitis and abscess status post incision and drainage done in the ER -given the significant involvement of  the left elbow area spreading to the distal arm and proximal forearm will admit for observation and started patient on empiric antibiotics.  Reassess in a.m. for any further worsening of the swelling or restriction of joint movement may need MRI at that time.  Follow cultures.  Patient states she does not use any IV drugs. 2. Acute on chronic kidney disease stage III last creatinine available in our system in 2016 was 1.3.  At that time it is noted that patient has chronic kidney disease stage III.  Patient however states that she has been taking more NSAIDs recently and also chronically.  Patient is also on ARB and hydrochlorothiazide for hypertension.  UA is still pending.  We will continue with hydration hold ARB hydrochlorothiazide discontinue NSAIDs advised not to take NSAIDs and based on her metabolic panel UA and will have further plans.  Closely follow intake output.  If creatinine does not improve by morning may have to get renal imaging. 3. Hypertension -due to worsening renal function holding ARB and hydrochlorothiazide for now keep patient on PRN IV hydralazine.  Follow blood  pressure trends. 4. History of anxiety on Klonopin. 5. History of depression on Prozac. 6. ADHD on Adderall.   DVT prophylaxis: SCDs. Code Status: Full code. Family Communication: Discussed with patient. Disposition Plan: Home. Consults called: None. Admission status: Observation.   Rise Patience MD [facility] Pager [phone].  If 7PM-7AM, please contact night-coverage [email or web address] Password [email or web address]  05/29/2018, 10:41 PM

## 2018-05-29 NOTE — ED Notes (Signed)
Report called to rn on  6n r

## 2018-05-29 NOTE — ED Provider Notes (Addendum)
[facility] EMERGENCY DEPARTMENT Provider Note   CSN: [id number] Arrival date & time: 05/29/18  1558     History   Chief Complaint Chief Complaint  Patient presents with  . Abscess    HPI Margaret Yoder is a 42 y.o. female.  HPI   64 YOF presents today with complaints of infection to her left elbow.  Patient notes symptoms started approximately 4 days ago.  Margaret Yoder notes Margaret Yoder was in her garage when Margaret Yoder felt something sharp on her arm see me Margaret Yoder got bit by an insect.  Margaret Yoder notes redness and swelling that started shortly after.  Margaret Yoder was seen yesterday by her primary care doctor who started her on Keflex (Margaret Yoder did not fill this prescription and has not taken it).  Margaret Yoder notes continued worsening of symptoms.  Margaret Yoder denies any fever at home, notes pain with attempted range of motion of the elbow with surrounding redness.  No purulence noted.  Patient denies any IV drug use although Margaret Yoder does note a history of the same in the past.  Margaret Yoder also notes a history of acute kidney injury after infection previously, but notes Margaret Yoder did most recently see her primary care provider and had normal kidney function, no history of chronic kidney disease.  Patient notes Margaret Yoder is using losartan for hypertension and has been using this over the last 6 months.   Past Medical History:  Diagnosis Date  . Anxiety   . Chronic back pain   . Depression   . Gout   . Hypertension     Patient Active Problem List   Diagnosis Date Noted  . Lung abscess ([place]) 11/10/2014  . Acute renal failure ([place]) 11/08/2014  . Sepsis ([place]) 11/08/2014  . Leukocytosis 11/08/2014  . Anemia 11/08/2014  . Community acquired pneumonia 11/05/2014  . Hypertension 11/05/2014    Past Surgical History:  Procedure Laterality Date  . VIDEO BRONCHOSCOPY N/A 11/11/2014   Procedure: VIDEO BRONCHOSCOPY;  Surgeon: Ivin Poot, MD;  Location: [facility] OR;  Service: Thoracic;  Laterality: N/A;     OB History   None      Home  Medications    Prior to Admission medications   Medication Sig Start Date End Date Taking? Authorizing Provider  FLUoxetine (PROZAC) 10 MG capsule Take 2 capsules by mouth daily. 05/04/15 05/03/16  [provider]  ibuprofen (ADVIL,MOTRIN) 600 MG tablet Take 1 tablet (600 mg total) by mouth every 6 (six) hours as needed. 03/09/16   Evalee Jefferson, PA-C  LORazepam (ATIVAN) 1 MG tablet Take 1 mg by mouth every 8 (eight) hours as needed for anxiety.    [provider]  oxyCODONE-acetaminophen (PERCOCET/ROXICET) 5-325 MG tablet Take 1 tablet by mouth every 4 (four) hours as needed. 03/09/16   Evalee Jefferson, PA-C    Family History No family history on file.  Social History Social History   Tobacco Use  . Smoking status: Current Every Day Smoker    Packs/day: 1.00    Types: Cigarettes  . Smokeless tobacco: Never Used  Substance Use Topics  . Alcohol use: No  . Drug use: No     Allergies   Patient has no known allergies.   Review of Systems Review of Systems  All other systems reviewed and are negative.    Physical Exam Updated Vital Signs BP (!) 116/48 (BP Location: Right Arm)   Pulse 89   Temp 98.1 F (36.7 C)   Resp 16   Ht 5\' 3"  (  1.6 m)   Wt 91.6 kg   LMP 05/15/2018   SpO2 100%   BMI 35.78 kg/m   Physical Exam  Constitutional: Margaret Yoder is oriented to person, place, and time. Margaret Yoder appears well-developed and well-nourished.  HENT:  Head: Normocephalic and atraumatic.  Eyes: Pupils are equal, round, and reactive to light. Conjunctivae are normal. Right eye exhibits no discharge. Left eye exhibits no discharge. No scleral icterus.  Neck: Normal range of motion. No JVD present. No tracheal deviation present.  Pulmonary/Chest: Effort normal. No stridor.  Musculoskeletal:  Abscess with induration noted over the left AC, 3 cm of induration noted with surrounding redness as noted in the photo-pain with flexion and extension-creased range of motion secondary to pain    Neurological: Margaret Yoder is alert and oriented to person, place, and time. Coordination normal.  Psychiatric: Margaret Yoder has a normal mood and affect. Her behavior is normal. Judgment and thought content normal.  Nursing note and vitals reviewed.      ED Treatments / Results  Labs (all labs ordered are listed, but only abnormal results are displayed) Labs Reviewed  COMPREHENSIVE METABOLIC PANEL - Abnormal; Notable for the following components:      Result Value   BUN 27 (*)    Creatinine, Ser 2.77 (*)    Calcium 8.7 (*)    Albumin 3.3 (*)    GFR calc non Af Amer 20 (*)    GFR calc Af Amer 23 (*)    All other components within normal limits  CBC - Abnormal; Notable for the following components:   WBC 11.7 (*)    All other components within normal limits  CULTURE, BLOOD (ROUTINE X 2)  CULTURE, BLOOD (ROUTINE X 2)  AEROBIC/ANAEROBIC CULTURE (SURGICAL/DEEP WOUND)  URINALYSIS, ROUTINE W REFLEX MICROSCOPIC  RAPID URINE DRUG SCREEN, HOSP PERFORMED  I-STAT BETA HCG BLOOD, ED ([facility], [facility], [facility] ONLY)  I-STAT CG4 LACTIC ACID, ED  I-STAT CG4 LACTIC ACID, ED    EKG None  Radiology No results found.  Procedures Procedures (including critical care time)  Medications Ordered in ED Medications  vancomycin (VANCOCIN) 1,750 mg in sodium chloride 0.9 % 500 mL IVPB (1,750 mg Intravenous New Bag/Given 05/29/18 2045)  vancomycin (VANCOCIN) IVPB 1000 mg/200 mL premix (has no administration in time range)  lidocaine-EPINEPHrine (XYLOCAINE W/EPI) 2 %-1:200000 (PF) injection 20 mL (20 mLs Infiltration Given 05/29/18 1930)  sodium chloride 0.9 % bolus 1,000 mL (0 mLs Intravenous Stopped 05/29/18 2026)  morphine 4 MG/ML injection 4 mg (4 mg Intravenous Given 05/29/18 2029)  clonazePAM (KLONOPIN) tablet 2 mg (2 mg Oral Given 05/29/18 2110)     Initial Impression / Assessment and Plan / ED Course  I have reviewed the triage vital signs and the nursing notes.  Pertinent labs & imaging results that were available  during my care of the patient were reviewed by me and considered in my medical decision making (see chart for details).     Labs: I-STAT lactic acid, blood culture, CMP, CBC  Imaging:  Consults:   Therapeutics: Vancomycin, lidocaine with epinephrine  Discharge Meds:   Assessment/Plan: 42 year old female presents today with abscess to her left antecubital fossa.  This does not appear to be intra-articular, Margaret Yoder does have significant surrounding cellulitis associated with this.  Patient was successfully I&D at bedside with cultures sent.  Patient did have blood cultures as well, and will be started on vancomycin.  Patient does have slight elevation in white count at 11.7, Margaret Yoder is afebrile with no tachycardia,  no signs of acute sepsis that would indicate need for  code sepsis.  Due to acute kidney injury, significant infection, need for IV antibiotics internal medicine will be consulted for admission.   Final Clinical Impressions(s) / ED Diagnoses   Final diagnoses:  Abscess  AKI (acute kidney injury) [facility])    ED Discharge Orders    None           Okey Regal, PA-C 05/29/18 2120    Varney Biles, MD 06/01/18 1503

## 2018-05-29 NOTE — Progress Notes (Signed)
Pharmacy Antibiotic Note  Margaret Yoder is a 42 y.o. female admitted on 05/29/2018 with cellulitis.  Pharmacy has been consulted for vancomycin dosing.  Abscess on L arm. I&D in the ED. Afebrile, WBC 11.7. AoCKD. SCr elevated at 2.77 (BL ~1.4), normalized CrCl ~25-14ml/min  Plan: Give vancomycin 1.75g IV x 1, then start vancomycin 1g IV Q48h Monitor clinical picture, renal function, VT prn F/U C&S, abx deescalation / LOT   Height: 5\' 3"  (160 cm) Weight: 202 lb (91.6 kg) IBW/kg (Calculated) : 52.4  Temp (24hrs), Avg:98.1 F (36.7 C), Min:98.1 F (36.7 C), Max:98.1 F (36.7 C)  Recent Labs  Lab 05/29/18 1614 05/29/18 1629 05/29/18 1937  WBC 11.7*  --   --   CREATININE 2.77*  --   --   LATICACIDVEN  --  0.83 0.71    Estimated Creatinine Clearance: 28.4 mL/min (A) (by C-G formula based on SCr of 2.77 mg/dL (H)).    No Known Allergies   Thank you for allowing pharmacy to be a part of this patient's care.  Reginia Naas 05/29/2018 8:33 PM

## 2018-05-30 DIAGNOSIS — I10 Essential (primary) hypertension: Secondary | ICD-10-CM

## 2018-05-30 DIAGNOSIS — L0291 Cutaneous abscess, unspecified: Secondary | ICD-10-CM

## 2018-05-30 LAB — CBC
HCT: 33.8 % — ABNORMAL LOW (ref 36.0–46.0)
HEMOGLOBIN: 10.9 g/dL — AB (ref 12.0–15.0)
MCH: 29.3 pg (ref 26.0–34.0)
MCHC: 32.2 g/dL (ref 30.0–36.0)
MCV: 90.9 fL (ref 80.0–100.0)
Platelets: 182 10*3/uL (ref 150–400)
RBC: 3.72 MIL/uL — AB (ref 3.87–5.11)
RDW: 12.5 % (ref 11.5–15.5)
WBC: 8.7 10*3/uL (ref 4.0–10.5)
nRBC: 0 % (ref 0.0–0.2)

## 2018-05-30 LAB — BASIC METABOLIC PANEL
ANION GAP: 8 (ref 5–15)
BUN: 24 mg/dL — ABNORMAL HIGH (ref 6–20)
CO2: 21 mmol/L — ABNORMAL LOW (ref 22–32)
Calcium: 8.3 mg/dL — ABNORMAL LOW (ref 8.9–10.3)
Chloride: 110 mmol/L (ref 98–111)
Creatinine, Ser: 2.2 mg/dL — ABNORMAL HIGH (ref 0.44–1.00)
GFR calc Af Amer: 31 mL/min — ABNORMAL LOW (ref >60)
GFR calc non Af Amer: 26 mL/min — ABNORMAL LOW (ref >60)
GLUCOSE: 105 mg/dL — AB (ref 70–99)
POTASSIUM: 4.4 mmol/L (ref 3.5–5.1)
Sodium: 139 mmol/L (ref 135–145)

## 2018-05-30 LAB — HIV ANTIBODY (ROUTINE TESTING W REFLEX): HIV Screen 4th Generation wRfx: NONREACTIVE

## 2018-05-30 MED ORDER — VANCOMYCIN HCL IN DEXTROSE 1-5 GM/200ML-% IV SOLN
1000.0000 mg | INTRAVENOUS | Status: DC
  Filled 2018-05-30: qty 200

## 2018-05-30 MED ORDER — SODIUM CHLORIDE 0.9 % IV SOLN
1.0000 g | INTRAVENOUS | Status: DC
  Administered 2018-05-30: 1 g via INTRAVENOUS
  Filled 2018-05-30: qty 10

## 2018-05-30 MED ORDER — DOXYCYCLINE HYCLATE 100 MG PO TABS: 100.0000 mg | ORAL_TABLET | Freq: Two times a day (BID) | ORAL | 0 refills | Status: AC

## 2018-05-30 MED ORDER — CEPHALEXIN 500 MG PO CAPS: 500.0000 mg | ORAL_CAPSULE | Freq: Three times a day (TID) | ORAL | 0 refills | Status: AC

## 2018-05-30 NOTE — Progress Notes (Signed)
Pharmacy Antibiotic Note  Margaret Yoder is a 42 y.o. female admitted on 05/29/2018 with cellulitis.  Pharmacy has been consulted for vancomycin dosing.  Abscess on L arm. I&D in the ED. Afebrile, WBC normalized. SCr has improved to 2.22 (BL ~ 1.4). nCrCl ~35-62ml/min. Cultures pending   Plan: Increase vancomycin to 1 gm IV Q 24 hours Ceftriaxone 1 gm IV Q 24 hours per MD  Monitor clinical picture, renal function, VT prn F/U C&S, abx deescalation / LOT   Height: 5\' 3"  (160 cm) Weight: 200 lb 4.8 oz (90.9 kg) IBW/kg (Calculated) : 52.4  Temp (24hrs), Avg:98.5 F (36.9 C), Min:98.1 F (36.7 C), Max:98.7 F (37.1 C)  Recent Labs  Lab 05/29/18 1614 05/29/18 1629 05/29/18 1937 05/30/18 0451  WBC 11.7*  --   --  8.7  CREATININE 2.77*  --   --  2.20*  LATICACIDVEN  --  0.83 0.71  --     Estimated Creatinine Clearance: 35.7 mL/min (A) (by C-G formula based on SCr of 2.2 mg/dL (H)).    No Known Allergies  10/9: deep wound >> pending 10/9 BCx >> ngtd   Thank you for allowing pharmacy to be a part of this patient's care.  Albertina Parr, PharmD., BCPS Clinical Pharmacist Clinical phone for 05/30/18 until 3:30pm: [phone] If after 3:30pm, please refer to [facility] for unit-specific pharmacist

## 2018-05-30 NOTE — Progress Notes (Signed)
[facility] Consult/Admission Note  Margaret Yoder [birth year]  [id number].    Requesting MD: Dr. Starla Link Chief Complaint/Reason for Consult: Left arm abscess & cellulitis  HPI:  Pt is a 42 yo female with a hx of anxiety, depression, HTN who presented to the ED last night with complaints of left arm pain and swelling. Pt states she thinks she got a bug bite. She noticed pain and swelling and redness that started 5 days prior. Symptoms progressively worsened and prompted her to come to the ER. Associated tingling in her left hand. No fever, chills, nausea or vomiting. 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 Pt denies IV drug use. Pt states she has a PCP that she can follow up with.   ROS:  Review of Systems  Constitutional: Negative for chills, diaphoresis and fever.  HENT: Negative for sore throat.   Respiratory: Negative for cough and shortness of breath.   Cardiovascular: Negative for chest pain.  Gastrointestinal: Negative for abdominal pain, blood in stool, constipation, diarrhea, nausea and vomiting.  Genitourinary: Negative for dysuria.  Musculoskeletal:       + left arm pain, swelling, redness  Skin: Negative for rash.  Neurological: Positive for tingling (left hand). Negative for dizziness and loss of consciousness.  All other systems reviewed and are negative.    Family History  Family history unknown: Yes    Past Medical History:  Diagnosis Date  . Anxiety   . Chronic back pain   . Depression   . Gout   . Hypertension     Past Surgical History:  Procedure Laterality Date  . VIDEO BRONCHOSCOPY N/A 11/11/2014   Procedure: VIDEO BRONCHOSCOPY;  Surgeon: Ivin Poot, MD;  Location: [facility] OR;  Service: Thoracic;  Laterality: N/A;    Social History:  reports that she has been smoking cigarettes. She has been smoking about 1.00 pack per day. She has never used smokeless tobacco. She reports that she does not drink alcohol or use drugs.  Allergies: No  Known Allergies  Medications Prior to Admission  Medication Sig Dispense Refill  . amphetamine-dextroamphetamine (ADDERALL XR) 25 MG 24 hr capsule Take 25 mg by mouth every morning.    . clonazePAM (KLONOPIN) 2 MG tablet Take 2 mg by mouth 2 (two) times daily.    Marland Kitchen FLUoxetine (PROZAC) 20 MG capsule Take 40 mg by mouth every morning.    Marland Kitchen ibuprofen (ADVIL,MOTRIN) 600 MG tablet Take 1 tablet (600 mg total) by mouth every 6 (six) hours as needed. (Patient not taking: Reported on 05/29/2018) 30 tablet 0  . oxyCODONE-acetaminophen (PERCOCET/ROXICET) 5-325 MG tablet Take 1 tablet by mouth every 4 (four) hours as needed. (Patient not taking: Reported on 05/29/2018) 20 tablet 0    Blood pressure 128/73, pulse 90, temperature 98.6 F (37 C), temperature source Oral, resp. rate 18, height _0  (1.6 m), weight 90.9 kg, last menstrual period 05/15/2018, SpO2 94 %.  Physical Exam  Constitutional: She is oriented to person, place, and time. She appears well-developed and well-nourished. No distress.  HENT:  Head: Normocephalic and atraumatic.  Nose: Nose normal.  Mouth/Throat: Oropharynx is clear and moist and mucous membranes are normal.  Eyes: Conjunctivae are normal. Right eye exhibits no discharge. Left eye exhibits no discharge. No scleral icterus.  Pupils are equal and round  Neck: Normal range of motion. Neck supple.  Cardiovascular: Normal rate, regular rhythm, normal heart sounds and intact distal pulses.  No murmur heard. Pulses:  Radial pulses are 2+ on the right side, and 2+ on the left side.       Dorsalis pedis pulses are 2+ on the right side, and 2+ on the left side.  Pulmonary/Chest: Effort normal and breath sounds normal. No respiratory distress. She has no wheezes. She has no rhonchi. She has no rales.  Abdominal: Soft. She exhibits no distension. There is no hepatosplenomegaly. There is no tenderness. There is no rigidity and no guarding.  Musculoskeletal: Normal range of  motion. She exhibits no edema, tenderness or deformity.  Lymphadenopathy:    She has no cervical adenopathy.  Neurological: She is alert and oriented to person, place, and time.  Skin: Skin is warm and dry. Rash (left anticubital region with marked induration, no fluctance, and surrounding erythema. wick in open wound with small amout of expressed purulent drainge) noted. She is not diaphoretic.  Psychiatric: She has a normal mood and affect.  Nursing note and vitals reviewed.   Results for orders placed or performed during the hospital encounter of 05/29/18 (from the past 48 hour(s))  Comprehensive metabolic panel     Status: Abnormal   Collection Time: 05/29/18  4:14 PM  Result Value Ref Range   Sodium 137 135 - 145 mmol/L   Potassium 4.3 3.5 - 5.1 mmol/L   Chloride 106 98 - 111 mmol/L   CO2 22 22 - 32 mmol/L   Glucose, Bld 92 70 - 99 mg/dL   BUN 27 (H) 6 - 20 mg/dL   Creatinine, Ser 2.77 (H) 0.44 - 1.00 mg/dL   Calcium 8.7 (L) 8.9 - 10.3 mg/dL   Total Protein 7.2 6.5 - 8.1 g/dL   Albumin 3.3 (L) 3.5 - 5.0 g/dL   AST 20 15 - 41 U/L   ALT 17 0 - 44 U/L   Alkaline Phosphatase 80 38 - 126 U/L   Total Bilirubin 0.6 0.3 - 1.2 mg/dL   GFR calc non Af Amer 20 (L) >60 mL/min   GFR calc Af Amer 23 (L) >60 mL/min    Comment: (NOTE) The eGFR has been calculated using the CKD EPI equation. This calculation has not been validated in all clinical situations. eGFR's persistently <60 mL/min signify possible Chronic Kidney Disease.    Anion gap 9 5 - 15    Comment: Performed at [facility] [address], [place], [place] [address]  CBC     Status: Abnormal   Collection Time: 05/29/18  4:14 PM  Result Value Ref Range   WBC 11.7 (H) 4.0 - 10.5 K/uL   RBC 4.10 3.87 - 5.11 MIL/uL   Hemoglobin 12.0 12.0 - 15.0 g/dL   HCT 37.9 36.0 - 46.0 %   MCV 92.4 80.0 - 100.0 fL   MCH 29.3 26.0 - 34.0 pg   MCHC 31.7 30.0 - 36.0 g/dL   RDW 12.7 11.5 - 15.5 %   Platelets 202 150 - 400 K/uL    nRBC 0.0 0.0 - 0.2 %    Comment: Performed at [facility], [place] [address], [place], [place] [address]  I-Stat beta hCG blood, ED     Status: None   Collection Time: 05/29/18  4:27 PM  Result Value Ref Range   I-stat hCG, quantitative <5.0 <5 mIU/mL   Comment 3            Comment:   GEST. AGE      CONC.  (mIU/mL)   <=1 WEEK  5 - 50     2 WEEKS       50 - 500     3 WEEKS       100 - 10,000     4 WEEKS     1,000 - 30,000        FEMALE AND NON-PREGNANT FEMALE:     LESS THAN 5 mIU/mL   I-Stat CG4 Lactic Acid, ED     Status: None   Collection Time: 05/29/18  4:29 PM  Result Value Ref Range   Lactic Acid, Venous 0.83 0.5 - 1.9 mmol/L  Blood culture (routine x 2)     Status: None (Preliminary result)   Collection Time: 05/29/18  7:15 PM  Result Value Ref Range   Specimen Description BLOOD RIGHT HAND    Special Requests      BOTTLES DRAWN AEROBIC AND ANAEROBIC Blood Culture results may not be optimal due to an inadequate volume of blood received in culture bottles   Culture      NO GROWTH < 12 HOURS Performed at [facility], [place] [address], [place], [place] [address]    Report Status PENDING   Blood culture (routine x 2)     Status: None (Preliminary result)   Collection Time: 05/29/18  7:25 PM  Result Value Ref Range   Specimen Description BLOOD RIGHT ANTECUBITAL    Special Requests      BOTTLES DRAWN AEROBIC AND ANAEROBIC Blood Culture adequate volume   Culture      NO GROWTH < 12 HOURS Performed at [facility], [place] [address] [place], [place] [address]    Report Status PENDING   I-Stat CG4 Lactic Acid, ED     Status: None   Collection Time: 05/29/18  7:37 PM  Result Value Ref Range   Lactic Acid, Venous 0.71 0.5 - 1.9 mmol/L  Aerobic/Anaerobic Culture (surgical/deep wound)     Status: None (Preliminary result)   Collection Time: 05/29/18  8:29 PM  Result Value Ref Range   Specimen Description LEFT ANTECUBITAL    Special Requests Normal    Gram  Stain      ABUNDANT WBC PRESENT, PREDOMINANTLY PMN MODERATE GRAM POSITIVE COCCI FEW GRAM NEGATIVE RODS Performed at [facility], [place] [address], [place], [place] [address]    Culture PENDING    Report Status PENDING   Rapid urine drug screen (hospital performed)     Status: Abnormal   Collection Time: 05/29/18 11:12 PM  Result Value Ref Range   Opiates POSITIVE (A) NONE DETECTED   Cocaine NONE DETECTED NONE DETECTED   Benzodiazepines POSITIVE (A) NONE DETECTED   Amphetamines NONE DETECTED NONE DETECTED   Tetrahydrocannabinol POSITIVE (A) NONE DETECTED   Barbiturates NONE DETECTED NONE DETECTED    Comment: (NOTE) DRUG SCREEN FOR MEDICAL PURPOSES ONLY.  IF CONFIRMATION IS NEEDED FOR ANY PURPOSE, NOTIFY LAB WITHIN 5 DAYS. LOWEST DETECTABLE LIMITS FOR URINE DRUG SCREEN Drug Class                     Cutoff (ng/mL) Amphetamine and metabolites    1000 Barbiturate and metabolites    200 Benzodiazepine                 726 Tricyclics and metabolites     300 Opiates and metabolites        300 Cocaine and metabolites        300 THC  50 Performed at [facility], [place] [address], [place], [place] [address]   Urinalysis, Routine w reflex microscopic     Status: Abnormal   Collection Time: 05/29/18 11:13 PM  Result Value Ref Range   Color, Urine STRAW (A) YELLOW   APPearance CLEAR CLEAR   Specific Gravity, Urine 1.008 1.005 - 1.030   pH 5.0 5.0 - 8.0   Glucose, UA NEGATIVE NEGATIVE mg/dL   Hgb urine dipstick NEGATIVE NEGATIVE   Bilirubin Urine NEGATIVE NEGATIVE   Ketones, ur NEGATIVE NEGATIVE mg/dL   Protein, ur NEGATIVE NEGATIVE mg/dL   Nitrite NEGATIVE NEGATIVE   Leukocytes, UA NEGATIVE NEGATIVE    Comment: Performed at [facility] [address] [place], [place] [address]  Pregnancy, urine     Status: None   Collection Time: 05/29/18 11:13 PM  Result Value Ref Range   Preg Test, Ur NEGATIVE NEGATIVE    Comment:        THE  SENSITIVITY OF THIS METHODOLOGY IS >20 mIU/mL. Performed at [facility], [place] [address], [place], [place] [address]   Sodium, urine, random     Status: None   Collection Time: 05/29/18 11:13 PM  Result Value Ref Range   Sodium, Ur 58 mmol/L    Comment: Performed at [facility] [address], [place], [place] [address]  Creatinine, urine, random     Status: None   Collection Time: 05/29/18 11:13 PM  Result Value Ref Range   Creatinine, Urine 62.66 mg/dL    Comment: Performed at [facility] [address] [place], [place] [address]  Basic metabolic panel     Status: Abnormal   Collection Time: 05/30/18  4:51 AM  Result Value Ref Range   Sodium 139 135 - 145 mmol/L   Potassium 4.4 3.5 - 5.1 mmol/L   Chloride 110 98 - 111 mmol/L   CO2 21 (L) 22 - 32 mmol/L   Glucose, Bld 105 (H) 70 - 99 mg/dL   BUN 24 (H) 6 - 20 mg/dL   Creatinine, Ser 2.20 (H) 0.44 - 1.00 mg/dL   Calcium 8.3 (L) 8.9 - 10.3 mg/dL   GFR calc non Af Amer 26 (L) >60 mL/min   GFR calc Af Amer 31 (L) >60 mL/min    Comment: (NOTE) The eGFR has been calculated using the CKD EPI equation. This calculation has not been validated in all clinical situations. eGFR's persistently <60 mL/min signify possible Chronic Kidney Disease.    Anion gap 8 5 - 15    Comment: Performed at [facility] [address], [place], [place] [address]  CBC     Status: Abnormal   Collection Time: 05/30/18  4:51 AM  Result Value Ref Range   WBC 8.7 4.0 - 10.5 K/uL   RBC 3.72 (L) 3.87 - 5.11 MIL/uL   Hemoglobin 10.9 (L) 12.0 - 15.0 g/dL   HCT 33.8 (L) 36.0 - 46.0 %   MCV 90.9 80.0 - 100.0 fL   MCH 29.3 26.0 - 34.0 pg   MCHC 32.2 30.0 - 36.0 g/dL   RDW 12.5 11.5 - 15.5 %   Platelets 182 150 - 400 K/uL   nRBC 0.0 0.0 - 0.2 %    Comment: Performed at [facility], [place] [address], [place], [place] [address]   No results found.    Assessment/Plan Principal Problem:   Cellulitis of left elbow Active  Problems:   Hypertension  Cellulitis   AKI (acute kidney injury) ([place])   Abscess  Left antecubital abscess/celulitis - open wound is draining, if it does not improve and/or surgery is indicated then ortho or hand would need to be consulted.  - added rocephin for broader coverage - follow cultures to narrow abx  - recommend one more day of IV abx therapy to see if cellulitis improves - f/u with PCP or wound clinic for wound check  - we will sign off. Please page [place] with any further needs for this patient.    Kalman Drape, [facility] 05/30/2018, 10:24 AM Pager: [phone] Consults: [phone] Mon-Fri 7:00 am-4:30 pm Sat-Sun 7:00 am-11:30 am

## 2018-05-30 NOTE — Care Management Note (Addendum)
Case Management Note  Patient Details  Name: Margaret Yoder MRN: [id number] Date of Birth: [birth year]  Subjective/Objective:                    Action/Plan:  [place] letter given and explained. Patient voiced understanding. Face sheet information correct. Home phone number [phone] Expected Discharge Date:  05/30/18               Expected Discharge Plan:  Home/Self Care  In-House Referral:  Financial Counselor  Discharge planning Services  CM Consult, Medication Assistance, [place] Program  Post Acute Care Choice:  Home Health Choice offered to:  Patient  DME Arranged:  N/A DME Agency:  NA  HH Arranged:  RN [place] Agency:  [facility]  Status of Service:  Completed, signed off  If discussed at [place] of Stay Meetings, dates discussed:    Additional Comments:  Marilu Favre, RN 05/30/2018, 2:09 PM

## 2018-05-30 NOTE — Consult Note (Signed)
[facility] Nurse wound consult note Patient evaluated in [facility] [phone].  Female family member present.  1/4 inch packing strip and dry gauze in/on wound bed. Reason for Consult: Wound dressing instructions for left antecubital I&D site care. Wound type: I&D site A segment of 1/4 inch packing strip was protruding from the I&D site.  This was not removed as a bottle of this product must be obtained from [employer] to repack the wound. Plan of care:  Place a strip of 1/4 inch Iodoform packing strip into the left antecubital I&D site.  Cover with dry gauze.  Change daily. Monitor the wound area(s) for worsening of condition such as: Signs/symptoms of infection,  Increase in size,  Development of or worsening of odor, Development of pain, or increased pain at the affected locations.  Notify the medical team if any of these develop.  Thank you for the consult.  Discussed plan of care with the patient and bedside nurse.  [place] nurse will not follow at this time.  Please re-consult the [place] team if needed.  Val Riles, RN, MSN, CWOCN, CNS-BC, pager [phone]

## 2018-05-30 NOTE — Progress Notes (Signed)
Patient requesting to go home explained to patient and family no discharge orders were written. Patient and girlfriend is very adamant to go home stated "father is paralyzed and no one to care for him". Explained to patient she is currently on antibiotic and will notify MD regarding her wishes. MD notified spoke with the patient new orders received.

## 2018-05-30 NOTE — Discharge Summary (Signed)
Physician Discharge Summary  Margaret Yoder BTD:[id number] DOB: 1976-05-08 DOA: 05/29/2018  PCP: Dorrene German, NP  Admit date: 05/29/2018 Discharge date: 05/30/2018  Admitted From: Home Disposition: Home  Recommendations for Outpatient Follow-up:  1. Follow up with PCP at earliest convenience 2. Follow-up in the ED if symptoms worsen or new appear   Home Health: Yes Equipment/Devices: None  Discharge Condition: Stable CODE STATUS: Full Diet recommendation:  Regular   Brief/Interim Summary: 42 year old female with history of hypertension, depression, anxiety, ADHD, chronic kidney disease stage III and chronic pain presented on 05/29/2018 with left elbow pain and swelling.  Patient had significant swelling of the left antecubital fossa and underwent I&D in the ED by the ER physician.  Patient was started on IV antibiotics.  General surgery was consulted who recommended 1 more day of IV antibiotics at least and added IV Rocephin.  Patient is adamant that she wants to go home and wants to go home on oral antibiotics.  She will be discharged home with close follow-up with primary care provider.  If her condition worsens, she might need evaluation by orthopedic/hand surgery.   Discharge Diagnoses:  Principal Problem:   Cellulitis of left elbow Active Problems:   Hypertension   Cellulitis   AKI (acute kidney injury) ([place])   Abscess  Left elbow cellulitis and abscess status post I&D done in the ED -Cellulitis is slightly improving.  Wound is draining only slightly.  Wound cultures pending. -Initially started on IV vancomycin. -General surgery was consulted who recommended 1 more day of IV antibiotics at least and added IV Rocephin.  Patient is adamant that she wants to go home and wants to go home on oral antibiotics.  She will be discharged home with close follow-up with primary care provider.  If her condition worsens, she might need evaluation by orthopedic/hand  surgery.  Leukocytosis -Resolved  Acute kidney injury on chronic kidney disease stage III -Improving.    Since patient is adamant to go home, will keep antihypertensives on hold and patient will need repeat blood work within a week at her PCPs office.  Hypertension -Blood pressure stable for now.  Home oral regimen on hold  Anxiety -On Klonopin  Depression -On Prozac  ADHD -On Adderall  Discharge Instructions  Discharge Instructions    Call MD for:  difficulty breathing, headache or visual disturbances   Complete by:  As directed    Call MD for:  extreme fatigue   Complete by:  As directed    Call MD for:  hives   Complete by:  As directed    Call MD for:  persistant dizziness or light-headedness   Complete by:  As directed    Call MD for:  persistant nausea and vomiting   Complete by:  As directed    Call MD for:  redness, tenderness, or signs of infection (pain, swelling, redness, odor or green/yellow discharge around incision site)   Complete by:  As directed    Call MD for:  severe uncontrolled pain   Complete by:  As directed    Call MD for:  temperature >100.4   Complete by:  As directed    Diet general   Complete by:  As directed    Increase activity slowly   Complete by:  As directed      Allergies as of 05/30/2018   No Known Allergies     Medication List    STOP taking these medications   clindamycin 150 MG capsule Commonly  known as:  CLEOCIN   ibuprofen 600 MG tablet Commonly known as:  ADVIL,MOTRIN   losartan-hydrochlorothiazide 50-12.5 MG tablet Commonly known as:  HYZAAR     TAKE these medications   amphetamine-dextroamphetamine 25 MG 24 hr capsule Commonly known as:  ADDERALL XR Take 25 mg by mouth every morning.   cephALEXin 500 MG capsule Commonly known as:  KEFLEX Take 1 capsule (500 mg total) by mouth 3 (three) times daily for 7 days.   clonazePAM 2 MG tablet Commonly known as:  KLONOPIN Take 2 mg by mouth 2 (two) times  daily.   doxycycline 100 MG tablet Commonly known as:  VIBRA-TABS Take 1 tablet (100 mg total) by mouth 2 (two) times daily for 7 days.   FLUoxetine 20 MG capsule Commonly known as:  PROZAC Take 40 mg by mouth daily.   HYDROcodone-acetaminophen 7.5-325 MG tablet Commonly known as:  NORCO Take 1 tablet by mouth every 6 (six) hours as needed for moderate pain.   hydrOXYzine 25 MG tablet Commonly known as:  ATARAX/VISTARIL Take 25 mg by mouth 3 (three) times daily as needed for itching.   ondansetron 4 MG tablet Commonly known as:  ZOFRAN Take 4 mg by mouth every 8 (eight) hours as needed for nausea or vomiting.      Follow-up Information    Dorrene German, NP. Schedule an appointment as soon as possible for a visit.   Specialty:  Internal Medicine Why:  At earliest convenience Contact information: [address] [place] [address] [phone]          No Known Allergies  Consultations:  General surgery   Procedures/Studies:  No results found.   Subjective: Patient seen and examined at bedside.  She feels slightly better but still complains of intermittent moderate pain in her left elbow.  No overnight fever or vomiting.  She wants to go home  Discharge Exam: Vitals:   05/29/18 2231 05/30/18 0548  BP: 139/62 128/73  Pulse: 90 90  Resp: 18 18  Temp: 98.7 F (37.1 C) 98.6 F (37 C)  SpO2: 99% 94%   Vitals:   05/29/18 1900 05/29/18 2000 05/29/18 2231 05/30/18 0548  BP: 109/66 (!) 116/48 139/62 128/73  Pulse: 94 89 90 90  Resp:  16 18 18   Temp:   98.7 F (37.1 C) 98.6 F (37 C)  TempSrc:   Oral Oral  SpO2: 100% 100% 99% 94%  Weight:   90.9 kg   Height:   5\' 3"  (1.6 m)     General exam: Appears calm and comfortable  Respiratory system: Bilateral decreased breath sounds at bases Cardiovascular system: S1 & S2 heard, Rate controlled Gastrointestinal system: Abdomen is nondistended, soft and  nontender. Normal bowel sounds heard. Extremities: Left antecubital area has erythema with tenderness and some induration, with an open wound with small amount of purulent drainage.    The results of significant diagnostics from this hospitalization (including imaging, microbiology, ancillary and laboratory) are listed below for reference.     Microbiology: Recent Results (from the past 240 hour(s))  Blood culture (routine x 2)     Status: None (Preliminary result)   Collection Time: 05/29/18  7:15 PM  Result Value Ref Range Status   Specimen Description BLOOD RIGHT HAND  Final   Special Requests   Final    BOTTLES DRAWN AEROBIC AND ANAEROBIC Blood Culture results may not be optimal due to an inadequate volume of blood received  in culture bottles   Culture   Final    NO GROWTH < 12 HOURS Performed at [facility], [place] [address] [place], [place] [address]    Report Status PENDING  Incomplete  Blood culture (routine x 2)     Status: None (Preliminary result)   Collection Time: 05/29/18  7:25 PM  Result Value Ref Range Status   Specimen Description BLOOD RIGHT ANTECUBITAL  Final   Special Requests   Final    BOTTLES DRAWN AEROBIC AND ANAEROBIC Blood Culture adequate volume   Culture   Final    NO GROWTH < 12 HOURS Performed at [facility], [place] [address], [place], [place] [address]    Report Status PENDING  Incomplete  Aerobic/Anaerobic Culture (surgical/deep wound)     Status: None (Preliminary result)   Collection Time: 05/29/18  8:29 PM  Result Value Ref Range Status   Specimen Description LEFT ANTECUBITAL  Final   Special Requests Normal  Final   Gram Stain   Final    ABUNDANT WBC PRESENT, PREDOMINANTLY PMN MODERATE GRAM POSITIVE COCCI FEW GRAM NEGATIVE RODS    Culture   Final    TOO YOUNG TO READ Performed at [facility], [place] [address], [place], [place] [address]    Report Status PENDING  Incomplete     Labs: BNP (last 3 results) No results  for input(s): BNP in the last 8760 hours. Basic Metabolic Panel: Recent Labs  Lab 05/29/18 1614 05/30/18 0451  NA 137 139  K 4.3 4.4  CL 106 110  CO2 22 21*  GLUCOSE 92 105*  BUN 27* 24*  CREATININE 2.77* 2.20*  CALCIUM 8.7* 8.3*   Liver Function Tests: Recent Labs  Lab 05/29/18 1614  AST 20  ALT 17  ALKPHOS 80  BILITOT 0.6  PROT 7.2  ALBUMIN 3.3*   No results for input(s): LIPASE, AMYLASE in the last 168 hours. No results for input(s): AMMONIA in the last 168 hours. CBC: Recent Labs  Lab 05/29/18 1614 05/30/18 0451  WBC 11.7* 8.7  HGB 12.0 10.9*  HCT 37.9 33.8*  MCV 92.4 90.9  PLT 202 182   Cardiac Enzymes: No results for input(s): CKTOTAL, CKMB, CKMBINDEX, TROPONINI in the last 168 hours. BNP: Invalid input(s): POCBNP CBG: No results for input(s): GLUCAP in the last 168 hours. D-Dimer No results for input(s): DDIMER in the last 72 hours. Hgb A1c No results for input(s): HGBA1C in the last 72 hours. Lipid Profile No results for input(s): CHOL, HDL, LDLCALC, TRIG, CHOLHDL, LDLDIRECT in the last 72 hours. Thyroid function studies No results for input(s): TSH, T4TOTAL, T3FREE, THYROIDAB in the last 72 hours.  Invalid input(s): FREET3 Anemia work up No results for input(s): VITAMINB12, FOLATE, FERRITIN, TIBC, IRON, RETICCTPCT in the last 72 hours. Urinalysis    Component Value Date/Time   COLORURINE STRAW (A) 05/29/2018 2313   APPEARANCEUR CLEAR 05/29/2018 2313   LABSPEC 1.008 05/29/2018 2313   PHURINE 5.0 05/29/2018 2313   GLUCOSEU NEGATIVE 05/29/2018 2313   HGBUR NEGATIVE 05/29/2018 2313   BILIRUBINUR NEGATIVE 05/29/2018 2313   KETONESUR NEGATIVE 05/29/2018 2313   PROTEINUR NEGATIVE 05/29/2018 2313   UROBILINOGEN 0.2 11/05/2014 1800   NITRITE NEGATIVE 05/29/2018 2313   LEUKOCYTESUR NEGATIVE 05/29/2018 2313   Sepsis Labs Invalid input(s): PROCALCITONIN,  WBC,  LACTICIDVEN Microbiology Recent Results (from the past 240 hour(s))  Blood culture  (routine x 2)     Status: None (Preliminary result)   Collection  Time: 05/29/18  7:15 PM  Result Value Ref Range Status   Specimen Description BLOOD RIGHT HAND  Final   Special Requests   Final    BOTTLES DRAWN AEROBIC AND ANAEROBIC Blood Culture results may not be optimal due to an inadequate volume of blood received in culture bottles   Culture   Final    NO GROWTH < 12 HOURS Performed at [place] [address], [place], [place] [address]    Report Status PENDING  Incomplete  Blood culture (routine x 2)     Status: None (Preliminary result)   Collection Time: 05/29/18  7:25 PM  Result Value Ref Range Status   Specimen Description BLOOD RIGHT ANTECUBITAL  Final   Special Requests   Final    BOTTLES DRAWN AEROBIC AND ANAEROBIC Blood Culture adequate volume   Culture   Final    NO GROWTH < 12 HOURS Performed at [facility], [place] [address], [place], [place] [address]    Report Status PENDING  Incomplete  Aerobic/Anaerobic Culture (surgical/deep wound)     Status: None (Preliminary result)   Collection Time: 05/29/18  8:29 PM  Result Value Ref Range Status   Specimen Description LEFT ANTECUBITAL  Final   Special Requests Normal  Final   Gram Stain   Final    ABUNDANT WBC PRESENT, PREDOMINANTLY PMN MODERATE GRAM POSITIVE COCCI FEW GRAM NEGATIVE RODS    Culture   Final    TOO YOUNG TO READ Performed at [facility], [place] [address], [place], [place] [address]    Report Status PENDING  Incomplete     Time coordinating discharge: 35 minutes  SIGNED:   Aline August, MD  [facility] 05/30/2018, 1:44 PM Pager: [phone]  If 7PM-7AM, please contact night-coverage [email or web address] Password [email or web address]

## 2018-05-30 NOTE — Plan of Care (Signed)

## 2018-05-30 NOTE — Progress Notes (Signed)
Patient ID: Margaret Yoder, female   DOB: Jul 07, 1976, 42 y.o.   MRN: [id number]  PROGRESS NOTE    Margaret Yoder  KGU:[id number] DOB: 11-18-1975 DOA: 05/29/2018 PCP: Margaret German, NP   Brief Narrative:  42 year old female with history of hypertension, depression, anxiety, ADHD, chronic kidney disease stage III and chronic pain presented on 05/29/2018 with left elbow pain and swelling.  Patient had significant swelling of the left antecubital fossa and underwent I&D in the ED by the ER physician.  Patient was started on IV antibiotics.   Assessment & Plan:   Principal Problem:   Cellulitis of left elbow Active Problems:   Hypertension   Cellulitis   AKI (acute kidney injury) ([place])   Abscess   Left elbow cellulitis and abscess status post I&D done in the ED -Cellulitis is slightly improving.  Wound is draining only slightly.  Follow wound cultures.  General surgery consulted to see if patient needs more I&D. -Continue IV vancomycin. -Continue pain management.  Still requiring intermittent IV pain medications  Leukocytosis -Resolved  Acute kidney injury on chronic kidney disease stage III -Improving.  Repeat a.m. labs.  Continue hydration.  ARB and hydrochlorothiazide on hold.  Avoid NSAIDs  Hypertension -Blood pressure stable for now.  Monitor.  Home oral regimen on hold  Anxiety -On Klonopin  Depression -On Prozac  ADHD -On Adderall  DVT prophylaxis: SCDs Code Status: Full Family Communication: Spoke with patient and girlfriend at bedside Disposition Plan: Home probably tomorrow if cleared by general surgery  Consultants: General surgery  Procedures: I&D done in the ED  Antimicrobials: Vancomycin from 05/29/2018 onwards   Subjective: Patient seen and examined at bedside.  She feels slightly better but still complains of intermittent moderate pain in her left elbow.  No overnight fever or vomiting.  Objective: Vitals:   05/29/18 1900 05/29/18 2000  05/29/18 2231 05/30/18 0548  BP: 109/66 (!) 116/48 139/62 128/73  Pulse: 94 89 90 90  Resp:  16 18 18   Temp:   98.7 F (37.1 C) 98.6 F (37 C)  TempSrc:   Oral Oral  SpO2: 100% 100% 99% 94%  Weight:   90.9 kg   Height:   5\' 3"  (1.6 m)     Intake/Output Summary (Last 24 hours) at 05/30/2018 1102 Last data filed at 05/30/2018 0719 Gross per 24 hour  Intake 1468.91 ml  Output 2400 ml  Net -931.09 ml   Filed Weights   05/29/18 1605 05/29/18 2231  Weight: 91.6 kg 90.9 kg    Examination:  General exam: Appears calm and comfortable  Respiratory system: Bilateral decreased breath sounds at bases Cardiovascular system: S1 & S2 heard, Rate controlled Gastrointestinal system: Abdomen is nondistended, soft and nontender. Normal bowel sounds heard. Extremities: Left antecubital area has erythema with tenderness and some induration, with an open wound with small amount of purulent drainage.   Data Reviewed: I have personally reviewed following labs and imaging studies  CBC: Recent Labs  Lab 05/29/18 1614 05/30/18 0451  WBC 11.7* 8.7  HGB 12.0 10.9*  HCT 37.9 33.8*  MCV 92.4 90.9  PLT 202 628   Basic Metabolic Panel: Recent Labs  Lab 05/29/18 1614 05/30/18 0451  NA 137 139  K 4.3 4.4  CL 106 110  CO2 22 21*  GLUCOSE 92 105*  BUN 27* 24*  CREATININE 2.77* 2.20*  CALCIUM 8.7* 8.3*   GFR: Estimated Creatinine Clearance: 35.7 mL/min (A) (by C-G formula based on SCr of 2.2 mg/dL (H)). Liver  Function Tests: Recent Labs  Lab 05/29/18 1614  AST 20  ALT 17  ALKPHOS 80  BILITOT 0.6  PROT 7.2  ALBUMIN 3.3*   No results for input(s): LIPASE, AMYLASE in the last 168 hours. No results for input(s): AMMONIA in the last 168 hours. Coagulation Profile: No results for input(s): INR, PROTIME in the last 168 hours. Cardiac Enzymes: No results for input(s): CKTOTAL, CKMB, CKMBINDEX, TROPONINI in the last 168 hours. BNP (last 3 results) No results for input(s): PROBNP in  the last 8760 hours. HbA1C: No results for input(s): HGBA1C in the last 72 hours. CBG: No results for input(s): GLUCAP in the last 168 hours. Lipid Profile: No results for input(s): CHOL, HDL, LDLCALC, TRIG, CHOLHDL, LDLDIRECT in the last 72 hours. Thyroid Function Tests: No results for input(s): TSH, T4TOTAL, FREET4, T3FREE, THYROIDAB in the last 72 hours. Anemia Panel: No results for input(s): VITAMINB12, FOLATE, FERRITIN, TIBC, IRON, RETICCTPCT in the last 72 hours. Sepsis Labs: Recent Labs  Lab 05/29/18 1629 05/29/18 1937  LATICACIDVEN 0.83 0.71    Recent Results (from the past 240 hour(s))  Blood culture (routine x 2)     Status: None (Preliminary result)   Collection Time: 05/29/18  7:15 PM  Result Value Ref Range Status   Specimen Description BLOOD RIGHT HAND  Final   Special Requests   Final    BOTTLES DRAWN AEROBIC AND ANAEROBIC Blood Culture results may not be optimal due to an inadequate volume of blood received in culture bottles   Culture   Final    NO GROWTH < 12 HOURS Performed at [facility] [address] [place], [place] [address]    Report Status PENDING  Incomplete  Blood culture (routine x 2)     Status: None (Preliminary result)   Collection Time: 05/29/18  7:25 PM  Result Value Ref Range Status   Specimen Description BLOOD RIGHT ANTECUBITAL  Final   Special Requests   Final    BOTTLES DRAWN AEROBIC AND ANAEROBIC Blood Culture adequate volume   Culture   Final    NO GROWTH < 12 HOURS Performed at [facility], [place] [address], [place], [place] [address]    Report Status PENDING  Incomplete  Aerobic/Anaerobic Culture (surgical/deep wound)     Status: None (Preliminary result)   Collection Time: 05/29/18  8:29 PM  Result Value Ref Range Status   Specimen Description LEFT ANTECUBITAL  Final   Special Requests Normal  Final   Gram Stain   Final    ABUNDANT WBC PRESENT, PREDOMINANTLY PMN MODERATE GRAM POSITIVE COCCI FEW GRAM NEGATIVE  RODS Performed at [facility], [place] [address] [place], [place] [address]    Culture PENDING  Incomplete   Report Status PENDING  Incomplete         Radiology Studies: No results found.      Scheduled Meds: . amphetamine-dextroamphetamine  25 mg Oral BH-q7a  . clonazePAM  2 mg Oral BID  . [START ON 05/31/2018] FLUoxetine  40 mg Oral BH-q7a   Continuous Infusions: . sodium chloride 75 mL/hr at 05/29/18 2302  . cefTRIAXone (ROCEPHIN)  IV    . vancomycin       LOS: 0 days        Aline August, MD [facility] Pager [phone]  If 7PM-7AM, please contact night-coverage [email or web address] Password [email or web address] 05/30/2018, 11:02 AM

## 2018-05-30 NOTE — Care Management Note (Addendum)
Case Management Note  Patient Details  Name: Margaret Yoder MRN: [id number] Date of Birth: 16-Jul-1976  Subjective/Objective:                    Action/Plan:  Will continue to follow for discharge needs. If DC on PO ABX can provide [place] letter. Patient has PCP  Expected Discharge Date:                  Expected Discharge Plan:  Home/Self Care  In-House Referral:  Financial Counselor  Discharge planning Services  CM Consult, Medication Assistance, [facility]  Post Acute Care Choice:    Choice offered to:     DME Arranged:    DME Agency:     HH Arranged:    HH Agency:     Status of Service:  In process, will continue to follow  If discussed at Long Length of Stay Meetings, dates discussed:    Additional Comments:  Marilu Favre, RN 05/30/2018, 10:46 AM

## 2018-06-04 LAB — CULTURE, BLOOD (ROUTINE X 2)
Culture: NO GROWTH
Culture: NO GROWTH
Special Requests: ADEQUATE

## 2018-06-04 LAB — AEROBIC/ANAEROBIC CULTURE W GRAM STAIN (SURGICAL/DEEP WOUND)

## 2018-06-04 LAB — AEROBIC/ANAEROBIC CULTURE (SURGICAL/DEEP WOUND): Special Requests: NORMAL

## 2020-02-19 ENCOUNTER — Other Ambulatory Visit: Payer: Self-pay

## 2020-02-19 ENCOUNTER — Encounter (HOSPITAL_COMMUNITY): Payer: Self-pay

## 2020-02-19 ENCOUNTER — Emergency Department (HOSPITAL_COMMUNITY): Payer: Self-pay

## 2020-02-19 ENCOUNTER — Emergency Department (HOSPITAL_COMMUNITY)
Admission: EM | Admit: 2020-02-19 | Discharge: 2020-02-19 | Disposition: A | Discharge status: Home / Self Care | Payer: Self-pay | Attending: Emergency Medicine | Admitting: Emergency Medicine

## 2020-02-19 DIAGNOSIS — I10 Essential (primary) hypertension: Secondary | ICD-10-CM | POA: Insufficient documentation

## 2020-02-19 DIAGNOSIS — F17210 Nicotine dependence, cigarettes, uncomplicated: Secondary | ICD-10-CM | POA: Insufficient documentation

## 2020-02-19 DIAGNOSIS — R0789 Other chest pain: Secondary | ICD-10-CM | POA: Insufficient documentation

## 2020-02-19 DIAGNOSIS — Z79899 Other long term (current) drug therapy: Secondary | ICD-10-CM | POA: Insufficient documentation

## 2020-02-19 DIAGNOSIS — R0602 Shortness of breath: Secondary | ICD-10-CM | POA: Insufficient documentation

## 2020-02-19 DIAGNOSIS — Z7982 Long term (current) use of aspirin: Secondary | ICD-10-CM | POA: Insufficient documentation

## 2020-02-19 LAB — BASIC METABOLIC PANEL
Anion gap: 11 (ref 5–15)
BUN: 36 mg/dL — ABNORMAL HIGH (ref 6–20)
CO2: 19 mmol/L — ABNORMAL LOW (ref 22–32)
Calcium: 9.4 mg/dL (ref 8.9–10.3)
Chloride: 105 mmol/L (ref 98–111)
Creatinine, Ser: 2.74 mg/dL — ABNORMAL HIGH (ref 0.44–1.00)
GFR calc Af Amer: 23 mL/min — ABNORMAL LOW (ref >60)
GFR calc non Af Amer: 20 mL/min — ABNORMAL LOW (ref >60)
Glucose, Bld: 197 mg/dL — ABNORMAL HIGH (ref 70–99)
Potassium: 4 mmol/L (ref 3.5–5.1)
Sodium: 135 mmol/L (ref 135–145)

## 2020-02-19 LAB — D-DIMER, QUANTITATIVE: D-Dimer, Quant: 0.68 ug/mL-FEU — ABNORMAL HIGH (ref 0.00–0.50)

## 2020-02-19 LAB — CBC
HCT: 43.4 % (ref 36.0–46.0)
Hemoglobin: 14.5 g/dL (ref 12.0–15.0)
MCH: 30.4 pg (ref 26.0–34.0)
MCHC: 33.4 g/dL (ref 30.0–36.0)
MCV: 91 fL (ref 80.0–100.0)
Platelets: 230 10*3/uL (ref 150–400)
RBC: 4.77 MIL/uL (ref 3.87–5.11)
RDW: 13.1 % (ref 11.5–15.5)
WBC: 8.2 10*3/uL (ref 4.0–10.5)
nRBC: 0 % (ref 0.0–0.2)

## 2020-02-19 LAB — TROPONIN I (HIGH SENSITIVITY)
Troponin I (High Sensitivity): 3 ng/L (ref <18)
Troponin I (High Sensitivity): 4 ng/L (ref <18)

## 2020-02-19 MED ORDER — ALBUTEROL SULFATE HFA 108 (90 BASE) MCG/ACT IN AERS
2.0000 | INHALATION_SPRAY | Freq: Once | RESPIRATORY_TRACT | Status: AC
  Administered 2020-02-19: 2 via RESPIRATORY_TRACT
  Filled 2020-02-19: qty 6.7

## 2020-02-19 MED ORDER — TECHNETIUM TO 99M ALBUMIN AGGREGATED
4.3000 | Freq: Once | INTRAVENOUS | Status: AC | PRN
  Administered 2020-02-19: 4.3 via INTRAVENOUS

## 2020-02-19 MED ORDER — ASPIRIN 81 MG PO CHEW
324.0000 mg | CHEWABLE_TABLET | Freq: Once | ORAL | Status: AC
  Administered 2020-02-19: 324 mg via ORAL
  Filled 2020-02-19: qty 4

## 2020-02-19 NOTE — ED Triage Notes (Signed)
Chest pain began on Sunday. Pt thought it was gas, but states it keeps coming back. Dull feeling mid chest that radiates down left arm. Also complaining of SOB and weakness.

## 2020-02-19 NOTE — ED Provider Notes (Signed)
[facility] EMERGENCY DEPARTMENT Provider Note   CSN: [id number] Arrival date & time: 02/19/20  1142     History Chief Complaint  Patient presents with  . Chest Pain    Margaret Yoder is a 44 y.o. female.  HPI 44 year old female presents with chest pain or shortness of breath.  All of the symptoms started on 6/27.  Started off with intermittent chest pain that feels dull and seems to go down her left arm.  Comes and goes with no obvious cause.  Nothing specific like exertion induces it.  Also has had constant shortness of breath since 6/27.  Further history elicits she had IRCVE-93 in January and has been having shortness of breath ever since though it seems like the shortness of breath is worse now.  A little bit of a cough that started yesterday.  No fevers.  Has had some leg swelling but with medication changes this is currently improved. Would like to try an inhaler.    Past Medical History:  Diagnosis Date  . Anxiety   . Chronic back pain   . Depression   . Gout   . Hypertension     Patient Active Problem List   Diagnosis Date Noted  . Cellulitis 05/29/2018  . AKI (acute kidney injury) ([place]) 05/29/2018  . Abscess 05/29/2018  . Cellulitis of left elbow 05/29/2018  . Lung abscess ([place]) 11/10/2014  . Acute renal failure ([place]) 11/08/2014  . Sepsis ([place]) 11/08/2014  . Leukocytosis 11/08/2014  . Anemia 11/08/2014  . Community acquired pneumonia 11/05/2014  . Hypertension 11/05/2014    Past Surgical History:  Procedure Laterality Date  . VIDEO BRONCHOSCOPY N/A 11/11/2014   Procedure: VIDEO BRONCHOSCOPY;  Surgeon: Ivin Poot, MD;  Location: [facility] OR;  Service: Thoracic;  Laterality: N/A;     OB History   No obstetric history on file.     Family History  Family history unknown: Yes    Social History   Tobacco Use  . Smoking status: Current Every Day Smoker    Packs/day: 1.00    Types: Cigarettes  . Smokeless tobacco: Never Used  Substance Use Topics   . Alcohol use: No  . Drug use: No    Home Medications Prior to Admission medications   Medication Sig Start Date End Date Taking? Authorizing Provider  amLODipine (NORVASC) 10 MG tablet Take 10 mg by mouth daily.   Yes [provider]  amphetamine-dextroamphetamine (ADDERALL XR) 25 MG 24 hr capsule Take 25 mg by mouth every morning. 08/02/17  Yes [provider]  aspirin 81 MG chewable tablet Chew 81 mg by mouth daily.   Yes [provider]  clonazePAM (KLONOPIN) 2 MG tablet Take 2 mg by mouth 2 (two) times daily.   Yes [provider]  FLUoxetine (PROZAC) 20 MG capsule Take 40 mg by mouth daily.    Yes [provider]  HYDROcodone-acetaminophen (NORCO) 7.5-325 MG tablet Take 1 tablet by mouth every 6 (six) hours as needed for moderate pain.   Yes [provider]  hydrOXYzine (ATARAX/VISTARIL) 25 MG tablet Take 25 mg by mouth 3 (three) times daily as needed for itching.   Yes [provider]  Specialty Vitamins Products (MENOPAUSE SUPPORT PO) Take 1 tablet by mouth daily.   Yes [provider]  METOPROLOL SUCCINATE PO Take 1 tablet by mouth daily.    [provider]  ondansetron (ZOFRAN) 4 MG tablet Take 4 mg by mouth every 8 (eight) hours as needed  for nausea or vomiting.    [provider]    Allergies    Patient has no known allergies.  Review of Systems   Review of Systems  Constitutional: Negative for fever.  Respiratory: Positive for cough and shortness of breath.   Cardiovascular: Positive for chest pain. Negative for leg swelling.  Gastrointestinal: Negative for abdominal pain.  All other systems reviewed and are negative.   Physical Exam Updated Vital Signs BP 117/82 (BP Location: Left Arm)   Pulse 96   Temp 97.7 F (36.5 C) (Oral)   Resp 15   Ht 5\' 3"  (1.6 m)   Wt 99.8 kg   SpO2 100%   BMI 38.97 kg/m   Physical Exam Vitals and nursing note reviewed.  Constitutional:       General: She is not in acute distress.    Appearance: She is well-developed. She is not ill-appearing or diaphoretic.  HENT:     Head: Normocephalic and atraumatic.     Right Ear: External ear normal.     Left Ear: External ear normal.     Nose: Nose normal.  Eyes:     General:        Right eye: No discharge.        Left eye: No discharge.  Cardiovascular:     Rate and Rhythm: Normal rate and regular rhythm.     Heart sounds: Normal heart sounds.  Pulmonary:     Effort: Pulmonary effort is normal.     Breath sounds: Normal breath sounds. No wheezing or rhonchi.  Abdominal:     Palpations: Abdomen is soft.     Tenderness: There is no abdominal tenderness.  Musculoskeletal:     Right lower leg: No edema.     Left lower leg: No edema.  Skin:    General: Skin is warm and dry.  Neurological:     Mental Status: She is alert.  Psychiatric:        Mood and Affect: Mood is not anxious.     ED Results / Procedures / Treatments   Labs (all labs ordered are listed, but only abnormal results are displayed) Labs Reviewed  BASIC METABOLIC PANEL - Abnormal; Notable for the following components:      Result Value   CO2 19 (*)    Glucose, Bld 197 (*)    BUN 36 (*)    Creatinine, Ser 2.74 (*)    GFR calc non Af Amer 20 (*)    GFR calc Af Amer 23 (*)    All other components within normal limits  D-DIMER, QUANTITATIVE (NOT AT [facility]) - Abnormal; Notable for the following components:   D-Dimer, Quant 0.68 (*)    All other components within normal limits  CBC  POC URINE PREG, ED  TROPONIN I (HIGH SENSITIVITY)  TROPONIN I (HIGH SENSITIVITY)    EKG EKG Interpretation  Date/Time:  Thursday February 19 2020 11:52:34 EDT Ventricular Rate:  101 PR Interval:    QRS Duration: 88 QT Interval:  354 QTC Calculation: 459 R Axis:   93 Text Interpretation: Sinus tachycardia Borderline right axis deviation rate is faster compared to 2011 Confirmed by Sherwood Gambler [phone]) on 02/19/2020 12:30:08  PM   Radiology DG Chest 2 View  Result Date: 02/19/2020 CLINICAL DATA:  Chest pain EXAM: CHEST - 2 VIEW COMPARISON:  03/09/2016 FINDINGS: The heart size and mediastinal contours are within normal limits. Both lungs are clear. The visualized skeletal structures are unremarkable. IMPRESSION: No active cardiopulmonary  disease. Electronically Signed   By: Franchot Gallo M.D.   On: 02/19/2020 13:14    Procedures Procedures (including critical care time)  Medications Ordered in ED Medications  aspirin chewable tablet 324 mg (324 mg Oral Given 02/19/20 1423)  albuterol (VENTOLIN HFA) 108 (90 Base) MCG/ACT inhaler 2 puff (2 puffs Inhalation Given 02/19/20 1423)  technetium albumin aggregated (MAA) injection solution 4.3 millicurie (4.3 millicuries Intravenous Contrast Given 02/19/20 1530)    ED Course  I have reviewed the triage vital signs and the nursing notes.  Pertinent labs & imaging results that were available during my care of the patient were reviewed by me and considered in my medical decision making (see chart for details).    MDM Rules/Calculators/A&P                          Patient has atypical chest pain.  Troponins negative x2.  Given the brief tachycardia seen on her ECG, D-dimer sent for otherwise low risk PE which is slightly positive.  Thus she will get VQ scan given her chronic kidney disease.  If this is negative I think she can go home.  Care transferred to Dr. Roderic Palau. No obvious pneumonia. Final Clinical Impression(s) / ED Diagnoses Final diagnoses:  None    Rx / DC Orders ED Discharge Orders    None       Sherwood Gambler, MD 02/19/20 1541

## 2020-02-19 NOTE — Discharge Instructions (Signed)
Follow-up with your doctor next week.  Return sooner if any problems

## 2021-04-06 IMAGING — NM NM PULMONARY PERF PARTICULATE
8 series · 8 of 8 positions shown · non-contrast
Comparison: Plain film from earlier in the same day.

CLINICAL DATA: Shortness of breath and chest pain

EXAM:
NUCLEAR MEDICINE PERFUSION LUNG SCAN
TECHNIQUE: Perfusion images were obtained in multiple projections after
intravenous injection of radiopharmaceutical.
Ventilation scans intentionally deferred if perfusion scan and chest
x-ray adequate for interpretation during COVID 19 epidemic.
RADIOPHARMACEUTICALS:  4.3 mCi 4c-99m MAA IV

[Series 1: ant/post perf · 4.14mm/px · 1 of 1 slices shown (1 of 2)]
[im 1/1]
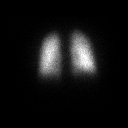

[Series 1: ant/post perf · 4.14mm/px · 1 of 1 slices shown (2 of 2)]
[im 1/1]
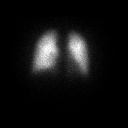

[Series 2: lao/rpo perf · 4.14mm/px · 1 of 1 slices shown (1 of 2)]
[im 1/1]
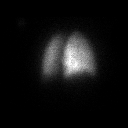

[Series 2: lao/rpo perf · 4.14mm/px · 1 of 1 slices shown (2 of 2)]
[im 1/1]
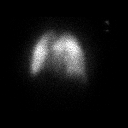

[Series 3: lt lat/rt lat perf · 4.14mm/px · 1 of 1 slices shown (1 of 2)]
[im 1/1]
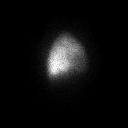

[Series 3: lt lat/rt lat perf · 4.14mm/px · 1 of 1 slices shown (2 of 2)]
[im 1/1]
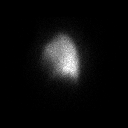

[Series 4: lpo/rao perf · 4.14mm/px · 1 of 1 slices shown (1 of 2)]
[im 1/1]
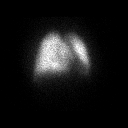

[Series 4: lpo/rao perf · 4.14mm/px · 1 of 1 slices shown (2 of 2)]
[im 1/1]
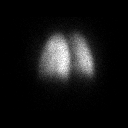

[8 of 8 positions shown; findings below may reference images not displayed]

FINDINGS: Normal uptake is noted throughout both lungs. No focal perfusion
defect to suggest pulmonary embolism is noted.
IMPRESSION: No evidence of pulmonary embolism.

## 2021-04-06 IMAGING — DX DG CHEST 2V
2 series · 2 of 2 positions shown · non-contrast
Comparison: 03/09/2016

CLINICAL DATA: Chest pain

EXAM:
CHEST - 2 VIEW

[chest pa]
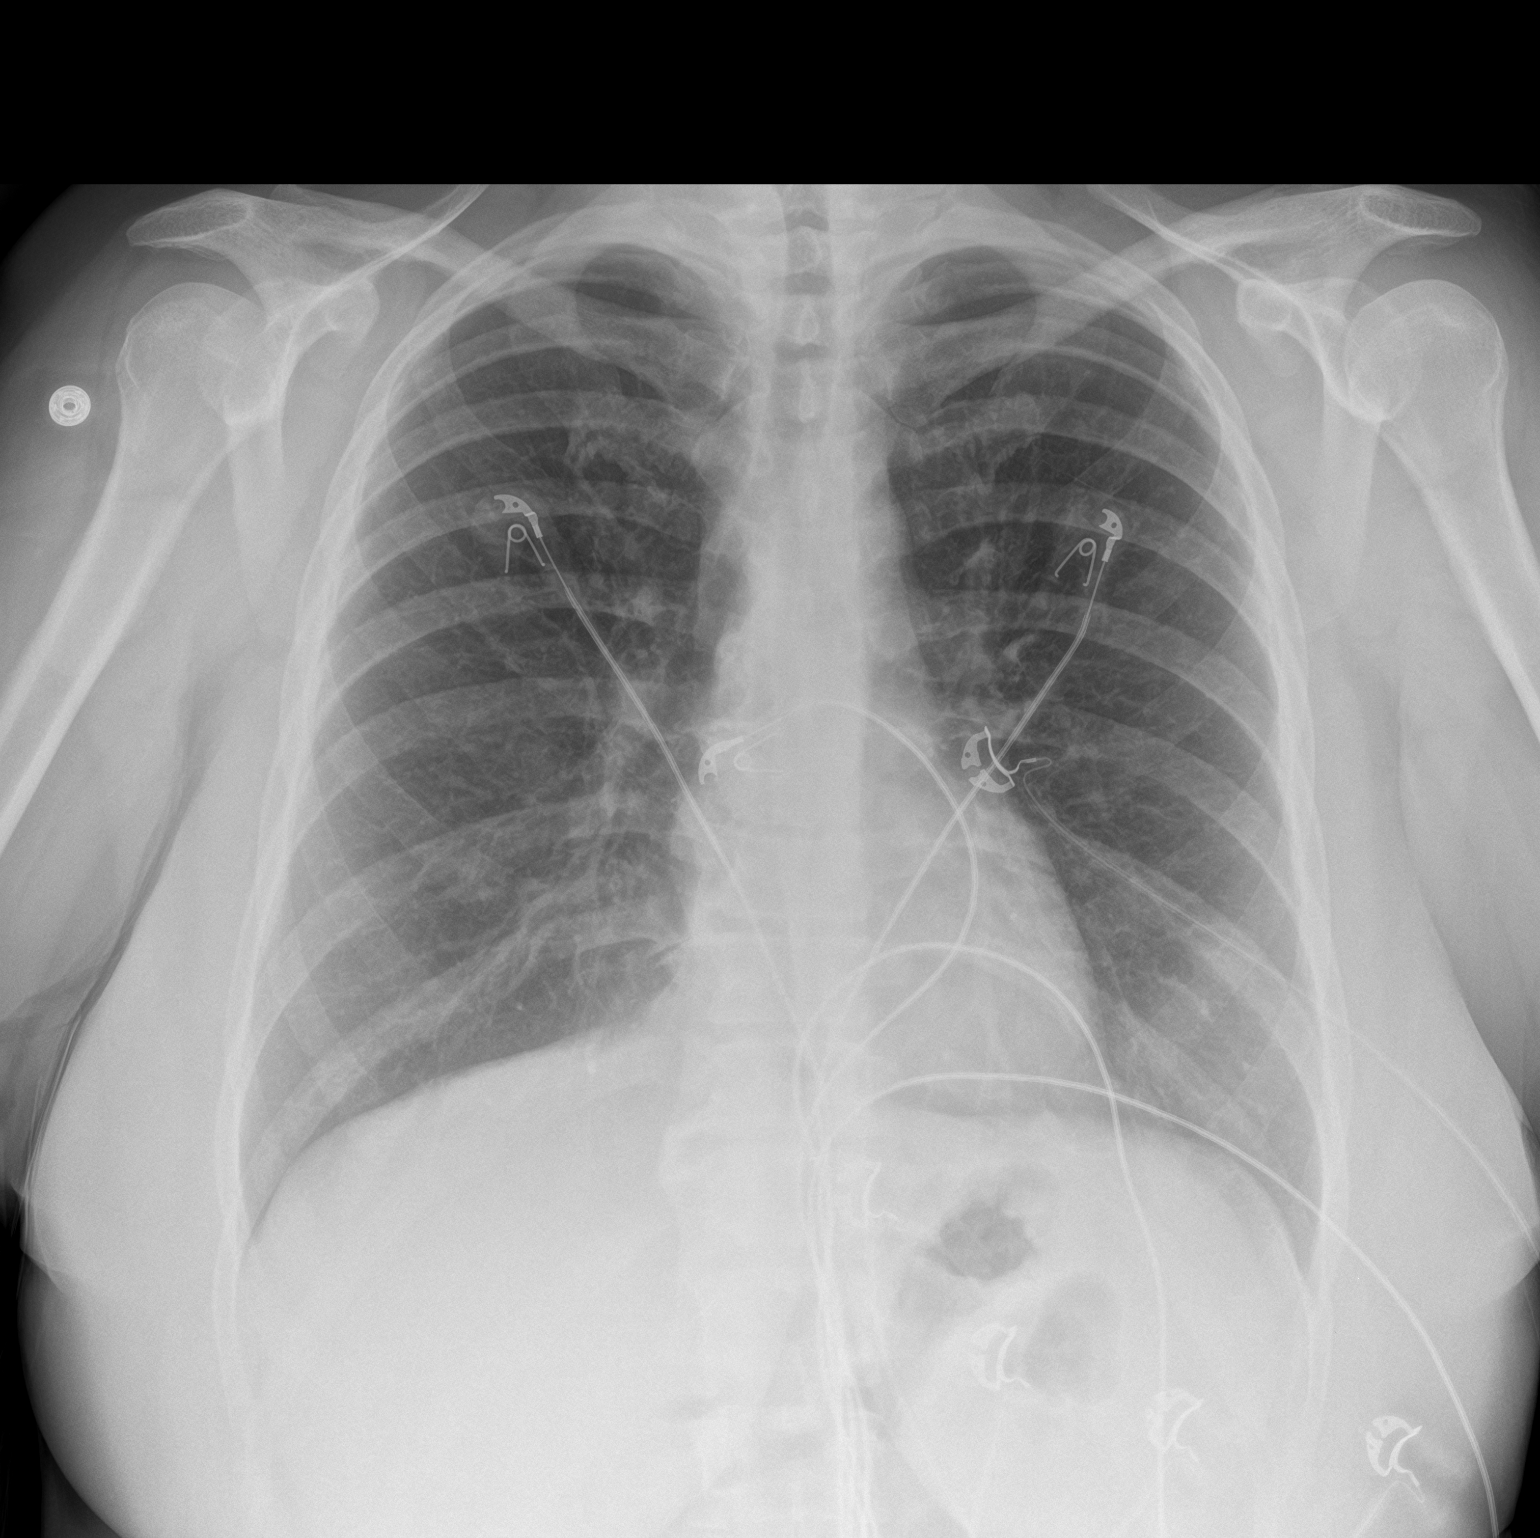

[chest lat]
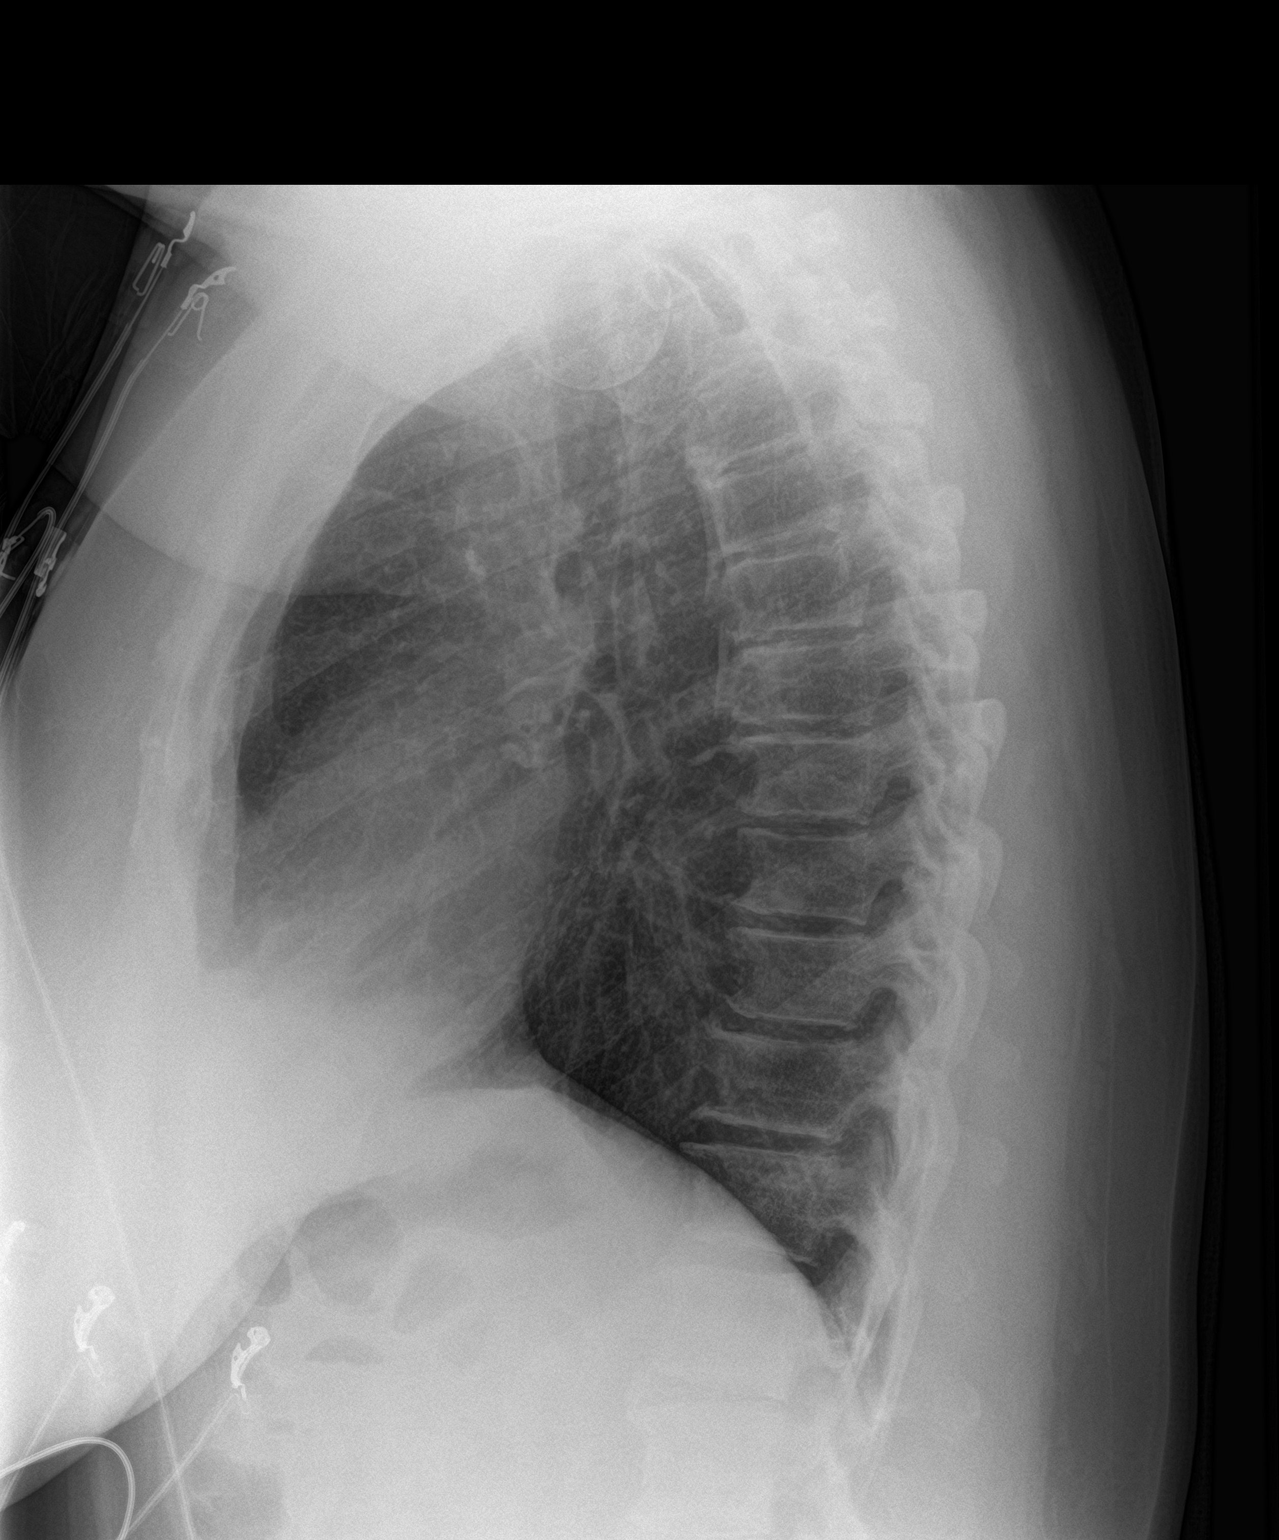

[2 of 2 positions shown; findings below may reference images not displayed]

FINDINGS: The heart size and mediastinal contours are within normal limits.
Both lungs are clear. The visualized skeletal structures are
unremarkable.
IMPRESSION: No active cardiopulmonary disease.

## 2021-10-19 ENCOUNTER — Other Ambulatory Visit: Payer: Self-pay | Admitting: Nephrology

## 2021-10-19 DIAGNOSIS — N184 Chronic kidney disease, stage 4 (severe): Secondary | ICD-10-CM

## 2021-10-28 ENCOUNTER — Ambulatory Visit (HOSPITAL_COMMUNITY): Admission: RE | Admit: 2021-10-28 | Payer: PRIVATE HEALTH INSURANCE | Source: Ambulatory Visit

## 2021-10-28 ENCOUNTER — Encounter (HOSPITAL_COMMUNITY): Payer: Self-pay

## 2022-02-20 ENCOUNTER — Inpatient Hospital Stay (HOSPITAL_COMMUNITY)
Admission: EM | Admit: 2022-02-20 | Discharge: 2022-02-22 | DRG: 418 | Disposition: A | Discharge status: Home / Self Care | Payer: PRIVATE HEALTH INSURANCE | Attending: Internal Medicine | Admitting: Internal Medicine

## 2022-02-20 ENCOUNTER — Encounter (HOSPITAL_COMMUNITY): Payer: Self-pay | Admitting: *Deleted

## 2022-02-20 ENCOUNTER — Other Ambulatory Visit: Payer: Self-pay

## 2022-02-20 DIAGNOSIS — F17210 Nicotine dependence, cigarettes, uncomplicated: Secondary | ICD-10-CM | POA: Diagnosis present

## 2022-02-20 DIAGNOSIS — R109 Unspecified abdominal pain: Secondary | ICD-10-CM

## 2022-02-20 DIAGNOSIS — M549 Dorsalgia, unspecified: Secondary | ICD-10-CM | POA: Diagnosis present

## 2022-02-20 DIAGNOSIS — D631 Anemia in chronic kidney disease: Secondary | ICD-10-CM | POA: Diagnosis present

## 2022-02-20 DIAGNOSIS — R110 Nausea: Secondary | ICD-10-CM

## 2022-02-20 DIAGNOSIS — G8929 Other chronic pain: Secondary | ICD-10-CM | POA: Diagnosis present

## 2022-02-20 DIAGNOSIS — N1832 Chronic kidney disease, stage 3b: Secondary | ICD-10-CM

## 2022-02-20 DIAGNOSIS — I160 Hypertensive urgency: Secondary | ICD-10-CM | POA: Diagnosis present

## 2022-02-20 DIAGNOSIS — K810 Acute cholecystitis: Principal | ICD-10-CM

## 2022-02-20 DIAGNOSIS — Z79899 Other long term (current) drug therapy: Secondary | ICD-10-CM

## 2022-02-20 DIAGNOSIS — K821 Hydrops of gallbladder: Secondary | ICD-10-CM | POA: Diagnosis present

## 2022-02-20 DIAGNOSIS — N184 Chronic kidney disease, stage 4 (severe): Secondary | ICD-10-CM | POA: Diagnosis present

## 2022-02-20 DIAGNOSIS — M109 Gout, unspecified: Secondary | ICD-10-CM | POA: Diagnosis present

## 2022-02-20 DIAGNOSIS — F419 Anxiety disorder, unspecified: Secondary | ICD-10-CM | POA: Diagnosis present

## 2022-02-20 DIAGNOSIS — F32A Depression, unspecified: Secondary | ICD-10-CM | POA: Diagnosis present

## 2022-02-20 DIAGNOSIS — K8000 Calculus of gallbladder with acute cholecystitis without obstruction: Secondary | ICD-10-CM | POA: Diagnosis not present

## 2022-02-20 DIAGNOSIS — Z7982 Long term (current) use of aspirin: Secondary | ICD-10-CM

## 2022-02-20 DIAGNOSIS — R748 Abnormal levels of other serum enzymes: Secondary | ICD-10-CM | POA: Diagnosis present

## 2022-02-20 DIAGNOSIS — R1011 Right upper quadrant pain: Secondary | ICD-10-CM | POA: Diagnosis not present

## 2022-02-20 DIAGNOSIS — I129 Hypertensive chronic kidney disease with stage 1 through stage 4 chronic kidney disease, or unspecified chronic kidney disease: Secondary | ICD-10-CM | POA: Diagnosis present

## 2022-02-20 DIAGNOSIS — I10 Essential (primary) hypertension: Secondary | ICD-10-CM

## 2022-02-20 LAB — CBC
HCT: 45.9 % (ref 36.0–46.0)
Hemoglobin: 15.3 g/dL — ABNORMAL HIGH (ref 12.0–15.0)
MCH: 27.9 pg (ref 26.0–34.0)
MCHC: 33.3 g/dL (ref 30.0–36.0)
MCV: 83.8 fL (ref 80.0–100.0)
Platelets: 287 10*3/uL (ref 150–400)
RBC: 5.48 MIL/uL — ABNORMAL HIGH (ref 3.87–5.11)
RDW: 13.2 % (ref 11.5–15.5)
WBC: 11 10*3/uL — ABNORMAL HIGH (ref 4.0–10.5)
nRBC: 0 % (ref 0.0–0.2)

## 2022-02-20 LAB — COMPREHENSIVE METABOLIC PANEL
ALT: 15 U/L (ref 0–44)
AST: 17 U/L (ref 15–41)
Albumin: 3.8 g/dL (ref 3.5–5.0)
Alkaline Phosphatase: 131 U/L — ABNORMAL HIGH (ref 38–126)
Anion gap: 9 (ref 5–15)
BUN: 22 mg/dL — ABNORMAL HIGH (ref 6–20)
CO2: 24 mmol/L (ref 22–32)
Calcium: 9.2 mg/dL (ref 8.9–10.3)
Chloride: 103 mmol/L (ref 98–111)
Creatinine, Ser: 2.1 mg/dL — ABNORMAL HIGH (ref 0.44–1.00)
GFR, Estimated: 29 mL/min — ABNORMAL LOW (ref >60)
Glucose, Bld: 123 mg/dL — ABNORMAL HIGH (ref 70–99)
Potassium: 5 mmol/L (ref 3.5–5.1)
Sodium: 136 mmol/L (ref 135–145)
Total Bilirubin: 0.5 mg/dL (ref 0.3–1.2)
Total Protein: 9 g/dL — ABNORMAL HIGH (ref 6.5–8.1)

## 2022-02-20 LAB — LIPASE, BLOOD: Lipase: 32 U/L (ref 11–51)

## 2022-02-20 MED ORDER — MORPHINE SULFATE (PF) 4 MG/ML IV SOLN
4.0000 mg | Freq: Once | INTRAVENOUS | Status: AC
  Administered 2022-02-20: 4 mg via INTRAVENOUS
  Filled 2022-02-20: qty 1

## 2022-02-20 MED ORDER — SODIUM CHLORIDE 0.9 % IV SOLN
2.0000 g | Freq: Once | INTRAVENOUS | Status: AC
  Administered 2022-02-20: 2 g via INTRAVENOUS
  Filled 2022-02-20: qty 20

## 2022-02-20 MED ORDER — SODIUM CHLORIDE 0.9 % IV BOLUS (SEPSIS)
1000.0000 mL | Freq: Once | INTRAVENOUS | Status: AC
  Administered 2022-02-20: 1000 mL via INTRAVENOUS

## 2022-02-20 MED ORDER — LABETALOL HCL 5 MG/ML IV SOLN
20.0000 mg | Freq: Once | INTRAVENOUS | Status: AC
  Administered 2022-02-21: 20 mg via INTRAVENOUS
  Filled 2022-02-20: qty 4

## 2022-02-20 MED ORDER — ONDANSETRON HCL 4 MG/2ML IJ SOLN
4.0000 mg | Freq: Once | INTRAMUSCULAR | Status: AC
  Administered 2022-02-20: 4 mg via INTRAVENOUS
  Filled 2022-02-20: qty 2

## 2022-02-20 MED ORDER — SODIUM CHLORIDE 0.9 % IV SOLN
1000.0000 mL | INTRAVENOUS | Status: DC
  Administered 2022-02-20: 1000 mL via INTRAVENOUS

## 2022-02-20 NOTE — ED Provider Notes (Incomplete)
[place] Provider Note   CSN: [id number] Arrival date & time: 02/20/22  1650     History {Add pertinent medical, surgical, social history, OB history to HPI:1} Chief Complaint  Patient presents with  . Abdominal Pain    Margaret Yoder is a 46 y.o. female.   Abdominal Pain   Patient has a history of hypertension, anxiety, gout, depression who presents to the ED  Home Medications Prior to Admission medications   Medication Sig Start Date End Date Taking? Authorizing Provider  amLODipine (NORVASC) 10 MG tablet Take 10 mg by mouth daily.    [provider]  amphetamine-dextroamphetamine (ADDERALL XR) 25 MG 24 hr capsule Take 25 mg by mouth every morning. 08/02/17   [provider]  aspirin 81 MG chewable tablet Chew 81 mg by mouth daily.    [provider]  clonazePAM (KLONOPIN) 2 MG tablet Take 2 mg by mouth 2 (two) times daily.    [provider]  FLUoxetine (PROZAC) 20 MG capsule Take 40 mg by mouth daily.     [provider]  HYDROcodone-acetaminophen (NORCO) 7.5-325 MG tablet Take 1 tablet by mouth every 6 (six) hours as needed for moderate pain.    [provider]  hydrOXYzine (ATARAX/VISTARIL) 25 MG tablet Take 25 mg by mouth 3 (three) times daily as needed for itching.    [provider]  METOPROLOL SUCCINATE PO Take 1 tablet by mouth daily.    [provider]  ondansetron (ZOFRAN) 4 MG tablet Take 4 mg by mouth every 8 (eight) hours as needed for nausea or vomiting.    [provider]  Specialty Vitamins Products (MENOPAUSE SUPPORT PO) Take 1 tablet by mouth daily.    [provider]      Allergies    Patient has no known allergies.    Review of Systems   Review of Systems  Gastrointestinal:  Positive for abdominal pain.    Physical Exam Updated Vital Signs BP (!) 202/119   Pulse 99   Temp 98.1 F (36.7 C) (Oral)   Resp 18   Ht 1.6 m ($Remo'5\' 3"'cQjif$ )    Wt 76.7 kg   SpO2 100%   BMI 29.94 kg/m  Physical Exam Vitals and nursing note reviewed.  Constitutional:      Appearance: She is well-developed.  HENT:     Head: Normocephalic and atraumatic.     Right Ear: External ear normal.     Left Ear: External ear normal.  Eyes:     General: No scleral icterus.       Right eye: No discharge.        Left eye: No discharge.     Conjunctiva/sclera: Conjunctivae normal.  Neck:     Trachea: No tracheal deviation.  Cardiovascular:     Rate and Rhythm: Normal rate and regular rhythm.  Pulmonary:     Effort: Pulmonary effort is normal. No respiratory distress.     Breath sounds: Normal breath sounds. No stridor. No wheezing or rales.  Abdominal:     General: Bowel sounds are normal. There is no distension.     Palpations: Abdomen is soft.     Tenderness: There is abdominal tenderness in the right upper quadrant and epigastric area. There is no guarding or rebound.  Musculoskeletal:        General: No tenderness or deformity.     Cervical back: Neck supple.  Skin:    General: Skin is warm and dry.  Findings: No rash.  Neurological:     General: No focal deficit present.     Mental Status: She is alert.     Cranial Nerves: No cranial nerve deficit (no facial droop, extraocular movements intact, no slurred speech).     Sensory: No sensory deficit.     Motor: No abnormal muscle tone or seizure activity.     Coordination: Coordination normal.  Psychiatric:        Mood and Affect: Mood normal.     ED Results / Procedures / Treatments   Labs (all labs ordered are listed, but only abnormal results are displayed) Labs Reviewed  COMPREHENSIVE METABOLIC PANEL - Abnormal; Notable for the following components:      Result Value   Glucose, Bld 123 (*)    BUN 22 (*)    Creatinine, Ser 2.10 (*)    Total Protein 9.0 (*)    Alkaline Phosphatase 131 (*)    GFR, Estimated 29 (*)    All other components within normal limits  CBC - Abnormal;  Notable for the following components:   WBC 11.0 (*)    RBC 5.48 (*)    Hemoglobin 15.3 (*)    All other components within normal limits  LIPASE, BLOOD  URINALYSIS, ROUTINE W REFLEX MICROSCOPIC  POC URINE PREG, ED    EKG None  Radiology No results found.  Procedures Procedures  {Document cardiac monitor, telemetry assessment procedure when appropriate:1}  Medications Ordered in ED Medications  sodium chloride 0.9 % bolus 1,000 mL (0 mLs Intravenous Stopped 02/20/22 2343)    Followed by  0.9 %  sodium chloride infusion (1,000 mLs Intravenous New Bag/Given 02/20/22 2344)  labetalol (NORMODYNE) injection 20 mg (has no administration in time range)  morphine (PF) 4 MG/ML injection 4 mg (4 mg Intravenous Given 02/20/22 2307)  ondansetron (ZOFRAN) injection 4 mg (4 mg Intravenous Given 02/20/22 2307)  cefTRIAXone (ROCEPHIN) 2 g in sodium chloride 0.9 % 100 mL IVPB (0 g Intravenous Stopped 02/20/22 2343)    ED Course/ Medical Decision Making/ A&P Clinical Course as of 02/21/22 0004  Mon Feb 20, 2022  2239 CBC(!) Leukocytosis noted [JK]  2239 Comprehensive metabolic panel(!) Creatinine elevated 2.1 but similar to previous values [JK]  2239 Lipase, blood Normal [JK]  2306 Case discussed with Dr Arnoldo Morale.  Likely plan on cholecystectomy on Wednesday.    US abdomen in the AM [JK]    Clinical Course User Index [JK] Dorie Rank, MD                           Medical Decision Making Differential diagnosis includes but not limited to, cholecystitis, biliary colic  Amount and/or Complexity of Data Reviewed External Data Reviewed: radiology and notes.    Details: Notes reviewed from [facility].  Patient had a CT scan of the abdomen pelvis.  Report showed profound gallbladder wall thickening with cholelithiasis.  The common duct measured 9 to 10 mm patient also noted to have splenomegaly findings were concerning for acute cholecystitis.  Patient  noted to have normal bilirubin level.  Elevated alk phos Labs: ordered. Decision-making details documented in ED Course. Radiology: ordered.  Risk Prescription drug management.   Patient was seen in another medical facility earlier this morning and had a CT scan that suggested acute cholecystitis.  Patient has had persistent pain since that time.  She is afebrile here but does have mild tachycardia.  Patient has persistent pain in her right upper quadrant. Laboratory test show persistent elevation in alk phos but no hyperbilirubinemia.  Doubt choledocholithiasis.  Patient does have an elevated white blood cell count.  Presentation concerning for acute cholecystitis.  I have ordered IV antibiotics fluids.  I spoke with Dr. Arnoldo Morale and the patient will be admitted to the hospital started on IV antibiotics.  He will see the patient in the morning.  Requests right upper quadrant ultrasound to be done in the morning when ultrasound is available  {Document critical care time when appropriate:1} {Document review of labs and clinical decision tools ie heart score, Chads2Vasc2 etc:1}  {Document your independent review of radiology images, and any outside records:1} {Document your discussion with family members, caretakers, and with consultants:1} {Document social determinants of health affecting pt's care:1} {Document your decision making why or why not admission, treatments were needed:1} Final Clinical Impression(s) / ED Diagnoses Final diagnoses:  Acute cholecystitis  Hypertension, unspecified type    Rx / DC Orders ED Discharge Orders     None

## 2022-02-20 NOTE — ED Triage Notes (Signed)
Pt seen at [facility] last night for abd pain, was told that pt needed to have her gallbladder removed. CT of abd showed gallbladder wall thickening with cholelithiasis. +N/V earlier this morning.

## 2022-02-20 NOTE — ED Provider Notes (Signed)
[place] Provider Note   CSN: [id number] Arrival date & time: 02/20/22  1650     History {Add pertinent medical, surgical, social history, OB history to HPI:1} Chief Complaint  Patient presents with  . Abdominal Pain    Margaret Yoder is a 46 y.o. female.   Abdominal Pain   Patient has a history of hypertension, anxiety, gout, depression who presents to the ED  Home Medications Prior to Admission medications   Medication Sig Start Date End Date Taking? Authorizing Provider  amLODipine (NORVASC) 10 MG tablet Take 10 mg by mouth daily.    [provider]  amphetamine-dextroamphetamine (ADDERALL XR) 25 MG 24 hr capsule Take 25 mg by mouth every morning. 08/02/17   [provider]  aspirin 81 MG chewable tablet Chew 81 mg by mouth daily.    [provider]  clonazePAM (KLONOPIN) 2 MG tablet Take 2 mg by mouth 2 (two) times daily.    [provider]  FLUoxetine (PROZAC) 20 MG capsule Take 40 mg by mouth daily.     [provider]  HYDROcodone-acetaminophen (NORCO) 7.5-325 MG tablet Take 1 tablet by mouth every 6 (six) hours as needed for moderate pain.    [provider]  hydrOXYzine (ATARAX/VISTARIL) 25 MG tablet Take 25 mg by mouth 3 (three) times daily as needed for itching.    [provider]  METOPROLOL SUCCINATE PO Take 1 tablet by mouth daily.    [provider]  ondansetron (ZOFRAN) 4 MG tablet Take 4 mg by mouth every 8 (eight) hours as needed for nausea or vomiting.    [provider]  Specialty Vitamins Products (MENOPAUSE SUPPORT PO) Take 1 tablet by mouth daily.    [provider]      Allergies    Patient has no known allergies.    Review of Systems   Review of Systems  Gastrointestinal:  Positive for abdominal pain.    Physical Exam Updated Vital Signs BP (!) 152/107   Pulse (!) 107   Temp 98.1 F (36.7 C) (Oral)   Resp 18   Ht 1.6 m (5'  3")   Wt 76.7 kg   SpO2 100%   BMI 29.94 kg/m  Physical Exam Vitals and nursing note reviewed.  Constitutional:      Appearance: Margaret Yoder is well-developed.  HENT:     Head: Normocephalic and atraumatic.     Right Ear: External ear normal.     Left Ear: External ear normal.  Eyes:     General: No scleral icterus.       Right eye: No discharge.        Left eye: No discharge.     Conjunctiva/sclera: Conjunctivae normal.  Neck:     Trachea: No tracheal deviation.  Cardiovascular:     Rate and Rhythm: Normal rate and regular rhythm.  Pulmonary:     Effort: Pulmonary effort is normal. No respiratory distress.     Breath sounds: Normal breath sounds. No stridor. No wheezing or rales.  Abdominal:     General: Bowel sounds are normal. There is no distension.     Palpations: Abdomen is soft.     Tenderness: There is abdominal tenderness in the right upper quadrant and epigastric area. There is no guarding or rebound.  Musculoskeletal:        General: No tenderness or deformity.     Cervical back: Neck supple.  Skin:    General: Skin is warm and  dry.     Findings: No rash.  Neurological:     General: No focal deficit present.     Mental Status: Margaret Yoder is alert.     Cranial Nerves: No cranial nerve deficit (no facial droop, extraocular movements intact, no slurred speech).     Sensory: No sensory deficit.     Motor: No abnormal muscle tone or seizure activity.     Coordination: Coordination normal.  Psychiatric:        Mood and Affect: Mood normal.     ED Results / Procedures / Treatments   Labs (all labs ordered are listed, but only abnormal results are displayed) Labs Reviewed  COMPREHENSIVE METABOLIC PANEL - Abnormal; Notable for the following components:      Result Value   Glucose, Bld 123 (*)    BUN 22 (*)    Creatinine, Ser 2.10 (*)    Total Protein 9.0 (*)    Alkaline Phosphatase 131 (*)    GFR, Estimated 29 (*)    All other components within normal limits  CBC -  Abnormal; Notable for the following components:   WBC 11.0 (*)    RBC 5.48 (*)    Hemoglobin 15.3 (*)    All other components within normal limits  LIPASE, BLOOD  URINALYSIS, ROUTINE W REFLEX MICROSCOPIC  POC URINE PREG, ED    EKG None  Radiology No results found.  Procedures Procedures  {Document cardiac monitor, telemetry assessment procedure when appropriate:1}  Medications Ordered in ED Medications  cefTRIAXone (ROCEPHIN) 2 g in sodium chloride 0.9 % 100 mL IVPB (2 g Intravenous New Bag/Given 02/20/22 2311)  sodium chloride 0.9 % bolus 1,000 mL (1,000 mLs Intravenous New Bag/Given 02/20/22 2307)    Followed by  0.9 %  sodium chloride infusion (has no administration in time range)  morphine (PF) 4 MG/ML injection 4 mg (4 mg Intravenous Given 02/20/22 2307)  ondansetron (ZOFRAN) injection 4 mg (4 mg Intravenous Given 02/20/22 2307)    ED Course/ Medical Decision Making/ A&P Clinical Course as of 02/20/22 2312  Mon Feb 20, 2022  2239 CBC(!) Leukocytosis noted [JK]  2239 Comprehensive metabolic panel(!) Creatinine elevated 2.1 but similar to previous values [JK]  2239 Lipase, blood Normal [JK]  2306 Case discussed with Dr Arnoldo Morale.  Likely plan on cholecystectomy on Wednesday.    US abdomen in the AM [JK]    Clinical Course User Index [JK] Dorie Rank, MD                           Medical Decision Making Differential diagnosis includes but not limited to, cholecystitis, biliary colic  Amount and/or Complexity of Data Reviewed External Data Reviewed: radiology and notes.    Details: Notes reviewed from [facility] at [facility] strokes.  Patient had a CT scan of the abdomen pelvis.  Report showed profound gallbladder wall thickening with cholelithiasis.  The common duct measured 9 to 10 mm patient also noted to have splenomegaly findings were concerning for acute cholecystitis.  Patient noted to have normal bilirubin level.  Elevated alk phos Labs:  ordered. Decision-making details documented in ED Course.  Risk Prescription drug management.   Patient was seen in another medical facility earlier this morning and had a CT scan that suggested acute cholecystitis.  Patient has had persistent pain since that time.  Margaret Yoder is afebrile here but does have mild tachycardia.  Patient has persistent pain in her right  upper quadrant. Laboratory test show persistent elevation in alk phos but no hyperbilirubinemia.  Doubt choledocholithiasis.  Patient does have an elevated white blood cell count.  Presentation concerning for acute cholecystitis.  I have ordered IV antibiotics fluids.  I spoke with Dr. Arnoldo Morale and the patient will be admitted to the hospital started on IV antibiotics.  He will see the patient in the morning.  Requests right upper quadrant ultrasound to be done in the morning when ultrasound is available  {Document critical care time when appropriate:1} {Document review of labs and clinical decision tools ie heart score, Chads2Vasc2 etc:1}  {Document your independent review of radiology images, and any outside records:1} {Document your discussion with family members, caretakers, and with consultants:1} {Document social determinants of health affecting pt's care:1} {Document your decision making why or why not admission, treatments were needed:1} Final Clinical Impression(s) / ED Diagnoses Final diagnoses:  Acute cholecystitis  Hypertension, unspecified type    Rx / DC Orders ED Discharge Orders     None

## 2022-02-21 ENCOUNTER — Inpatient Hospital Stay (HOSPITAL_COMMUNITY): Payer: PRIVATE HEALTH INSURANCE

## 2022-02-21 DIAGNOSIS — R748 Abnormal levels of other serum enzymes: Secondary | ICD-10-CM

## 2022-02-21 DIAGNOSIS — R110 Nausea: Secondary | ICD-10-CM

## 2022-02-21 DIAGNOSIS — F419 Anxiety disorder, unspecified: Secondary | ICD-10-CM | POA: Diagnosis present

## 2022-02-21 DIAGNOSIS — I129 Hypertensive chronic kidney disease with stage 1 through stage 4 chronic kidney disease, or unspecified chronic kidney disease: Secondary | ICD-10-CM | POA: Diagnosis present

## 2022-02-21 DIAGNOSIS — K810 Acute cholecystitis: Principal | ICD-10-CM | POA: Diagnosis present

## 2022-02-21 DIAGNOSIS — I10 Essential (primary) hypertension: Secondary | ICD-10-CM

## 2022-02-21 DIAGNOSIS — F32A Depression, unspecified: Secondary | ICD-10-CM | POA: Diagnosis present

## 2022-02-21 DIAGNOSIS — I160 Hypertensive urgency: Secondary | ICD-10-CM | POA: Diagnosis present

## 2022-02-21 DIAGNOSIS — M109 Gout, unspecified: Secondary | ICD-10-CM | POA: Diagnosis present

## 2022-02-21 DIAGNOSIS — M549 Dorsalgia, unspecified: Secondary | ICD-10-CM | POA: Diagnosis present

## 2022-02-21 DIAGNOSIS — N184 Chronic kidney disease, stage 4 (severe): Secondary | ICD-10-CM | POA: Diagnosis present

## 2022-02-21 DIAGNOSIS — Z7982 Long term (current) use of aspirin: Secondary | ICD-10-CM | POA: Diagnosis not present

## 2022-02-21 DIAGNOSIS — R109 Unspecified abdominal pain: Secondary | ICD-10-CM

## 2022-02-21 DIAGNOSIS — Z79899 Other long term (current) drug therapy: Secondary | ICD-10-CM | POA: Diagnosis not present

## 2022-02-21 DIAGNOSIS — K821 Hydrops of gallbladder: Secondary | ICD-10-CM | POA: Diagnosis present

## 2022-02-21 DIAGNOSIS — N1832 Chronic kidney disease, stage 3b: Secondary | ICD-10-CM

## 2022-02-21 DIAGNOSIS — R1011 Right upper quadrant pain: Secondary | ICD-10-CM

## 2022-02-21 DIAGNOSIS — K8000 Calculus of gallbladder with acute cholecystitis without obstruction: Secondary | ICD-10-CM | POA: Diagnosis present

## 2022-02-21 DIAGNOSIS — D631 Anemia in chronic kidney disease: Secondary | ICD-10-CM | POA: Diagnosis present

## 2022-02-21 DIAGNOSIS — G8929 Other chronic pain: Secondary | ICD-10-CM | POA: Diagnosis present

## 2022-02-21 DIAGNOSIS — F17210 Nicotine dependence, cigarettes, uncomplicated: Secondary | ICD-10-CM | POA: Diagnosis present

## 2022-02-21 DIAGNOSIS — F418 Other specified anxiety disorders: Secondary | ICD-10-CM | POA: Diagnosis not present

## 2022-02-21 LAB — CBC
HCT: 40.3 % (ref 36.0–46.0)
HCT: 41.1 % (ref 36.0–46.0)
Hemoglobin: 13.2 g/dL (ref 12.0–15.0)
Hemoglobin: 13.3 g/dL (ref 12.0–15.0)
MCH: 27.5 pg (ref 26.0–34.0)
MCH: 27.7 pg (ref 26.0–34.0)
MCHC: 32.4 g/dL (ref 30.0–36.0)
MCHC: 32.8 g/dL (ref 30.0–36.0)
MCV: 84.5 fL (ref 80.0–100.0)
MCV: 84.9 fL (ref 80.0–100.0)
Platelets: 226 10*3/uL (ref 150–400)
Platelets: 229 10*3/uL (ref 150–400)
RBC: 4.77 MIL/uL (ref 3.87–5.11)
RBC: 4.84 MIL/uL (ref 3.87–5.11)
RDW: 13.2 % (ref 11.5–15.5)
RDW: 13.2 % (ref 11.5–15.5)
WBC: 11.7 10*3/uL — ABNORMAL HIGH (ref 4.0–10.5)
WBC: 11.7 10*3/uL — ABNORMAL HIGH (ref 4.0–10.5)
nRBC: 0 % (ref 0.0–0.2)
nRBC: 0 % (ref 0.0–0.2)

## 2022-02-21 LAB — COMPREHENSIVE METABOLIC PANEL
ALT: 13 U/L (ref 0–44)
AST: 14 U/L — ABNORMAL LOW (ref 15–41)
Albumin: 3.1 g/dL — ABNORMAL LOW (ref 3.5–5.0)
Alkaline Phosphatase: 103 U/L (ref 38–126)
Anion gap: 8 (ref 5–15)
BUN: 21 mg/dL — ABNORMAL HIGH (ref 6–20)
CO2: 19 mmol/L — ABNORMAL LOW (ref 22–32)
Calcium: 8.5 mg/dL — ABNORMAL LOW (ref 8.9–10.3)
Chloride: 109 mmol/L (ref 98–111)
Creatinine, Ser: 2.03 mg/dL — ABNORMAL HIGH (ref 0.44–1.00)
GFR, Estimated: 30 mL/min — ABNORMAL LOW (ref >60)
Glucose, Bld: 142 mg/dL — ABNORMAL HIGH (ref 70–99)
Potassium: 4.9 mmol/L (ref 3.5–5.1)
Sodium: 136 mmol/L (ref 135–145)
Total Bilirubin: 0.3 mg/dL (ref 0.3–1.2)
Total Protein: 7.4 g/dL (ref 6.5–8.1)

## 2022-02-21 LAB — CREATININE, SERUM
Creatinine, Ser: 2.01 mg/dL — ABNORMAL HIGH (ref 0.44–1.00)
GFR, Estimated: 30 mL/min — ABNORMAL LOW (ref >60)

## 2022-02-21 LAB — PREGNANCY, URINE: Preg Test, Ur: NEGATIVE

## 2022-02-21 LAB — URINALYSIS, ROUTINE W REFLEX MICROSCOPIC
Bilirubin Urine: NEGATIVE
Glucose, UA: NEGATIVE mg/dL
Ketones, ur: NEGATIVE mg/dL
Leukocytes,Ua: NEGATIVE
Nitrite: NEGATIVE
Protein, ur: 100 mg/dL — AB
Specific Gravity, Urine: 1.005 (ref 1.005–1.030)
pH: 6 (ref 5.0–8.0)

## 2022-02-21 LAB — HIV ANTIBODY (ROUTINE TESTING W REFLEX): HIV Screen 4th Generation wRfx: NONREACTIVE

## 2022-02-21 LAB — MRSA NEXT GEN BY PCR, NASAL: MRSA by PCR Next Gen: NOT DETECTED

## 2022-02-21 LAB — APTT: aPTT: 29 seconds (ref 24–36)

## 2022-02-21 LAB — MAGNESIUM: Magnesium: 1.8 mg/dL (ref 1.7–2.4)

## 2022-02-21 LAB — PHOSPHORUS: Phosphorus: 3.5 mg/dL (ref 2.5–4.6)

## 2022-02-21 MED ORDER — HYDRALAZINE HCL 20 MG/ML IJ SOLN
10.0000 mg | Freq: Four times a day (QID) | INTRAMUSCULAR | Status: DC | PRN
  Administered 2022-02-22: 10 mg via INTRAVENOUS
  Filled 2022-02-21: qty 1

## 2022-02-21 MED ORDER — SODIUM CHLORIDE 0.9 % IV SOLN
1000.0000 mL | INTRAVENOUS | Status: DC
  Administered 2022-02-21: 1000 mL via INTRAVENOUS

## 2022-02-21 MED ORDER — AMLODIPINE BESYLATE 5 MG PO TABS
10.0000 mg | ORAL_TABLET | Freq: Every day | ORAL | Status: DC
  Administered 2022-02-21 – 2022-02-22 (×2): 10 mg via ORAL
  Filled 2022-02-21 (×2): qty 2

## 2022-02-21 MED ORDER — LABETALOL HCL 5 MG/ML IV SOLN: 10.0000 mg | INTRAVENOUS | Status: DC | PRN

## 2022-02-21 MED ORDER — HYDROMORPHONE HCL 1 MG/ML IJ SOLN
1.0000 mg | INTRAMUSCULAR | Status: DC | PRN
  Administered 2022-02-21 – 2022-02-22 (×6): 1 mg via INTRAVENOUS
  Filled 2022-02-21 (×6): qty 1

## 2022-02-21 MED ORDER — CARVEDILOL 3.125 MG PO TABS
6.2500 mg | ORAL_TABLET | Freq: Two times a day (BID) | ORAL | Status: DC
  Administered 2022-02-21 – 2022-02-22 (×3): 6.25 mg via ORAL
  Filled 2022-02-21 (×3): qty 2

## 2022-02-21 MED ORDER — HYDROMORPHONE HCL 1 MG/ML IJ SOLN
1.0000 mg | Freq: Once | INTRAMUSCULAR | Status: AC
  Administered 2022-02-21: 1 mg via INTRAVENOUS
  Filled 2022-02-21: qty 1

## 2022-02-21 MED ORDER — ACETAMINOPHEN 650 MG RE SUPP: 650.0000 mg | Freq: Four times a day (QID) | RECTAL | Status: DC | PRN

## 2022-02-21 MED ORDER — ONDANSETRON HCL 4 MG/2ML IJ SOLN: 4.0000 mg | Freq: Four times a day (QID) | INTRAMUSCULAR | Status: DC | PRN

## 2022-02-21 MED ORDER — HEPARIN SODIUM (PORCINE) 5000 UNIT/ML IJ SOLN
5000.0000 [IU] | Freq: Three times a day (TID) | INTRAMUSCULAR | Status: DC
Start: 2022-02-21 — End: 2022-02-22
  Filled 2022-02-21 (×2): qty 1

## 2022-02-21 MED ORDER — ACETAMINOPHEN 325 MG PO TABS: 650.0000 mg | ORAL_TABLET | Freq: Four times a day (QID) | ORAL | Status: DC | PRN

## 2022-02-21 MED ORDER — ONDANSETRON HCL 4 MG PO TABS: 4.0000 mg | ORAL_TABLET | Freq: Four times a day (QID) | ORAL | Status: DC | PRN

## 2022-02-21 MED ORDER — PIPERACILLIN-TAZOBACTAM 3.375 G IVPB
3.3750 g | Freq: Three times a day (TID) | INTRAVENOUS | Status: DC
  Administered 2022-02-21 – 2022-02-22 (×4): 3.375 g via INTRAVENOUS
  Filled 2022-02-21 (×4): qty 50

## 2022-02-21 MED ORDER — HYDROMORPHONE HCL 1 MG/ML IJ SOLN
0.5000 mg | INTRAMUSCULAR | Status: DC | PRN
  Administered 2022-02-21: 0.5 mg via INTRAVENOUS
  Filled 2022-02-21 (×2): qty 0.5

## 2022-02-21 NOTE — H&P (View-Only) (Signed)
Reason for Consult: Cholecystitis, cholelithiasis Referring Physician: Dr. Francia Greaves is an 46 y.o. female.  HPI: Patient is a 46 year old white female who presented to the emergency room with right upper quadrant abdominal pain and nausea.  She states she underwent a CT scan of the abdomen at another facility and was told she had cholelithiasis with cholecystitis.  They did not have a surgeon available and she was told to follow-up with her primary care physician.  She states she had a similar episode of right upper quadrant abdominal pain and nausea several weeks ago.  It resolved on its own.  This time, the symptoms persisted.  She denies any fever, chills, or jaundice.  Her LFTs in the emergency room were remarkable only for mildly elevated alkaline phosphatase.  Past Medical History:  Diagnosis Date   Anxiety    Chronic back pain    Depression    Gout    Hypertension     Past Surgical History:  Procedure Laterality Date   VIDEO BRONCHOSCOPY N/A 11/11/2014   Procedure: VIDEO BRONCHOSCOPY;  Surgeon: Ivin Poot, MD;  Location: [facility] OR;  Service: Thoracic;  Laterality: N/A;    Family History  Family history unknown: Yes    Social History:  reports that she has been smoking cigarettes. She has been smoking an average of 1 pack per day. She has never used smokeless tobacco. She reports that she does not drink alcohol and does not use drugs.  Allergies: No Known Allergies  Medications: I have reviewed the patient's current medications. Prior to Admission:  Medications Prior to Admission  Medication Sig Dispense Refill Last Dose   aspirin 81 MG chewable tablet Chew 81 mg by mouth daily.   Past Week   amLODipine (NORVASC) 10 MG tablet Take 10 mg by mouth daily. (Patient not taking: Reported on 02/21/2022)   Not Taking   amphetamine-dextroamphetamine (ADDERALL XR) 25 MG 24 hr capsule Take 25 mg by mouth every morning. (Patient not taking: Reported on 02/21/2022)   Not  Taking   clonazePAM (KLONOPIN) 2 MG tablet Take 2 mg by mouth 2 (two) times daily. (Patient not taking: Reported on 02/21/2022)   Not Taking   FLUoxetine (PROZAC) 20 MG capsule Take 40 mg by mouth daily.  (Patient not taking: Reported on 02/21/2022)   Not Taking   HYDROcodone-acetaminophen (NORCO) 7.5-325 MG tablet Take 1 tablet by mouth every 6 (six) hours as needed for moderate pain. (Patient not taking: Reported on 02/21/2022)   Not Taking   hydrOXYzine (ATARAX/VISTARIL) 25 MG tablet Take 25 mg by mouth 3 (three) times daily as needed for itching. (Patient not taking: Reported on 02/21/2022)   Not Taking   ipratropium-albuterol (DUONEB) 0.5-2.5 (3) MG/3ML SOLN Take 3 mLs by nebulization every 4 (four) hours as needed for shortness of breath. (Patient not taking: Reported on 02/21/2022)   Not Taking   METOPROLOL SUCCINATE PO Take 1 tablet by mouth daily. (Patient not taking: Reported on 02/21/2022)   Not Taking   ondansetron (ZOFRAN) 4 MG tablet Take 4 mg by mouth every 8 (eight) hours as needed for nausea or vomiting. (Patient not taking: Reported on 02/21/2022)   Not Taking    Results for orders placed or performed during the hospital encounter of 02/20/22 (from the past 48 hour(s))  Lipase, blood     Status: None   Collection Time: 02/20/22  6:42 PM  Result Value Ref Range   Lipase 32 11 - 51 U/L  Comment: Performed at [facility], [address], [place], [place] [address]  Comprehensive metabolic panel     Status: Abnormal   Collection Time: 02/20/22  6:42 PM  Result Value Ref Range   Sodium 136 135 - 145 mmol/L   Potassium 5.0 3.5 - 5.1 mmol/L   Chloride 103 98 - 111 mmol/L   CO2 24 22 - 32 mmol/L   Glucose, Bld 123 (H) 70 - 99 mg/dL    Comment: Glucose reference range applies only to samples taken after fasting for at least 8 hours.   BUN 22 (H) 6 - 20 mg/dL   Creatinine, Ser 2.10 (H) 0.44 - 1.00 mg/dL   Calcium 9.2 8.9 - 10.3 mg/dL   Total Protein 9.0 (H) 6.5 - 8.1 g/dL   Albumin 3.8 3.5 -  5.0 g/dL   AST 17 15 - 41 U/L   ALT 15 0 - 44 U/L   Alkaline Phosphatase 131 (H) 38 - 126 U/L   Total Bilirubin 0.5 0.3 - 1.2 mg/dL   GFR, Estimated 29 (L) >60 mL/min    Comment: (NOTE) Calculated using the CKD-EPI Creatinine Equation (2021)    Anion gap 9 5 - 15    Comment: Performed at [facility], [address] [place], [place] [address]  CBC     Status: Abnormal   Collection Time: 02/20/22  6:42 PM  Result Value Ref Range   WBC 11.0 (H) 4.0 - 10.5 K/uL   RBC 5.48 (H) 3.87 - 5.11 MIL/uL   Hemoglobin 15.3 (H) 12.0 - 15.0 g/dL   HCT 45.9 36.0 - 46.0 %   MCV 83.8 80.0 - 100.0 fL   MCH 27.9 26.0 - 34.0 pg   MCHC 33.3 30.0 - 36.0 g/dL   RDW 13.2 11.5 - 15.5 %   Platelets 287 150 - 400 K/uL   nRBC 0.0 0.0 - 0.2 %    Comment: Performed at [facility], [address] [place], [place] [address]  Urinalysis, Routine w reflex microscopic Urine, Clean Catch     Status: Abnormal   Collection Time: 02/21/22 12:04 AM  Result Value Ref Range   Color, Urine STRAW (A) YELLOW   APPearance CLEAR CLEAR   Specific Gravity, Urine 1.005 1.005 - 1.030   pH 6.0 5.0 - 8.0   Glucose, UA NEGATIVE NEGATIVE mg/dL   Hgb urine dipstick SMALL (A) NEGATIVE   Bilirubin Urine NEGATIVE NEGATIVE   Ketones, ur NEGATIVE NEGATIVE mg/dL   Protein, ur 100 (A) NEGATIVE mg/dL   Nitrite NEGATIVE NEGATIVE   Leukocytes,Ua NEGATIVE NEGATIVE   RBC / HPF 0-5 0 - 5 RBC/hpf   WBC, UA 0-5 0 - 5 WBC/hpf   Bacteria, UA RARE (A) NONE SEEN   Squamous Epithelial / LPF 0-5 0 - 5   Mucus PRESENT     Comment: Performed at [facility], [address], [place], [place] [address]  Pregnancy, urine     Status: None   Collection Time: 02/21/22 12:04 AM  Result Value Ref Range   Preg Test, Ur NEGATIVE NEGATIVE    Comment:        THE SENSITIVITY OF THIS METHODOLOGY IS >20 mIU/mL. Performed at [facility], [address], [place], [place] [address]   CBC     Status: Abnormal   Collection Time: 02/21/22  4:50 AM  Result Value  Ref Range   WBC 11.7 (H) 4.0 - 10.5 K/uL   RBC 4.84 3.87 - 5.11 MIL/uL   Hemoglobin 13.3  12.0 - 15.0 g/dL   HCT 41.1 36.0 - 46.0 %   MCV 84.9 80.0 - 100.0 fL   MCH 27.5 26.0 - 34.0 pg   MCHC 32.4 30.0 - 36.0 g/dL   RDW 13.2 11.5 - 15.5 %   Platelets 226 150 - 400 K/uL   nRBC 0.0 0.0 - 0.2 %    Comment: Performed at [facility], [address], [place], [place] [address]  Creatinine, serum     Status: Abnormal   Collection Time: 02/21/22  4:50 AM  Result Value Ref Range   Creatinine, Ser 2.01 (H) 0.44 - 1.00 mg/dL   GFR, Estimated 30 (L) >60 mL/min    Comment: (NOTE) Calculated using the CKD-EPI Creatinine Equation (2021) Performed at [facility], [address], [place], [place] [address]   CBC     Status: Abnormal   Collection Time: 02/21/22  4:50 AM  Result Value Ref Range   WBC 11.7 (H) 4.0 - 10.5 K/uL   RBC 4.77 3.87 - 5.11 MIL/uL   Hemoglobin 13.2 12.0 - 15.0 g/dL   HCT 40.3 36.0 - 46.0 %   MCV 84.5 80.0 - 100.0 fL   MCH 27.7 26.0 - 34.0 pg   MCHC 32.8 30.0 - 36.0 g/dL   RDW 13.2 11.5 - 15.5 %   Platelets 229 150 - 400 K/uL   nRBC 0.0 0.0 - 0.2 %    Comment: Performed at [facility], [address], [place], [place] [address]  Comprehensive metabolic panel     Status: Abnormal   Collection Time: 02/21/22  4:50 AM  Result Value Ref Range   Sodium 136 135 - 145 mmol/L   Potassium 4.9 3.5 - 5.1 mmol/L   Chloride 109 98 - 111 mmol/L   CO2 19 (L) 22 - 32 mmol/L   Glucose, Bld 142 (H) 70 - 99 mg/dL    Comment: Glucose reference range applies only to samples taken after fasting for at least 8 hours.   BUN 21 (H) 6 - 20 mg/dL   Creatinine, Ser 2.03 (H) 0.44 - 1.00 mg/dL   Calcium 8.5 (L) 8.9 - 10.3 mg/dL   Total Protein 7.4 6.5 - 8.1 g/dL   Albumin 3.1 (L) 3.5 - 5.0 g/dL   AST 14 (L) 15 - 41 U/L   ALT 13 0 - 44 U/L   Alkaline Phosphatase 103 38 - 126 U/L   Total Bilirubin 0.3 0.3 - 1.2 mg/dL   GFR, Estimated 30 (L) >60 mL/min    Comment: (NOTE) Calculated using the  CKD-EPI Creatinine Equation (2021)    Anion gap 8 5 - 15    Comment: Performed at [facility], [address] [place], [place] [address]  APTT     Status: None   Collection Time: 02/21/22  4:50 AM  Result Value Ref Range   aPTT 29 24 - 36 seconds    Comment: Performed at [facility], [address] [place], [place] [address]  Magnesium     Status: None   Collection Time: 02/21/22  4:50 AM  Result Value Ref Range   Magnesium 1.8 1.7 - 2.4 mg/dL    Comment: Performed at [facility], [address], [place], [place] [address]  Phosphorus     Status: None   Collection Time: 02/21/22  4:50 AM  Result Value Ref Range   Phosphorus 3.5 2.5 - 4.6 mg/dL    Comment: Performed at [facility], [address] [place],  [address]  No results found.  ROS:  Pertinent items are noted in HPI.  Blood pressure (!) 175/105, pulse 99, temperature 98.8 F (37.1 C), temperature source Oral, resp. rate 20, height 5\' 3"  (1.6 m), weight 76.7 kg, SpO2 97 %. Physical Exam: Anxious white female in no acute distress Head is normocephalic, atraumatic Eyes are without scleral icterus Lungs are clear to auscultation with equal breath sounds bilaterally Heart examination reveals regular rate and rhythm without S3, S4, murmurs Abdomen is soft with tenderness down the right upper quadrant to palpation.  No rigidity is noted.  Ultrasound of right upper quadrant pending  Assessment/Plan: Impression: Acute cholecystitis with cholelithiasis.  It is reassuring that her LFTs are for the most part within normal limits.  Her mild leukocytosis is not significantly worsening. Plan: We will proceed with a laparoscopic cholecystectomy on 02/22/2022.  The risks and benefits of the procedure including bleeding, infection, hepatobiliary injury, and the possibility of an open procedure were fully explained to the patient, who gave informed consent.  She is significantly hypertensive but this is being addressed.  Aviva Signs 02/21/2022, 8:59 AM

## 2022-02-21 NOTE — H&P (Signed)
History and Physical    Patient: Margaret Yoder DOB: Jan 26, 1976 DOA: 02/20/2022 DOS: the patient was seen and examined on 02/21/2022 PCP: Candi Leash, PA-C  Patient coming from: Home  Chief Complaint:  Chief Complaint  Patient presents with   Abdominal Pain   HPI: Margaret Yoder is a 46 y.o. female with medical history significant of hypertension, CKD stage III, anxiety, chronic pain, depression who presents to the emergency department due to 1 day onset of right upper quadrant abdominal pain.  Patient was seen at [facility] on 7/2 in the evening, CT abdomen done at the facility was reported to have gallbladder wall thickening with cholelithiasis.  She complained of nausea without vomiting which started yesterday in the morning (7/3).  Abdominal pain was sharp and severe in nature.  There was no known alleviating/aggravating factor.  Patient states that she was told that her gallbladder needed to be removed.  ED course: In the emergency department, she was intermittently tachycardic, BP was elevated at 202/119, but other vital signs were within normal range.  Work-up in the ED showed WBC 11.0 and normocytic anemia, BMP was normal except for BUN/creatinine at 22/2.10 (creatinine is within baseline range).  CBG 123, ALP 131 She was treated with IV ceftriaxone, Dilaudid was given morphine was given, Zofran was given.  IV hydration was provided, general surgery (Dr. Arnoldo Morale) was consulted and recommended admitting patient with plan to consult with patient in the morning per ED physician.  Hospitalist was asked to admit patient for further evaluation and management.   Review of Systems: As mentioned in the history of present illness. All other systems reviewed and are negative. Past Medical History:  Diagnosis Date   Anxiety    Chronic back pain    Depression    Gout    Hypertension    Past Surgical History:  Procedure Laterality Date   VIDEO BRONCHOSCOPY N/A 11/11/2014    Procedure: VIDEO BRONCHOSCOPY;  Surgeon: Ivin Poot, MD;  [facility]: [facility] OR;  Service: Thoracic;  Laterality: N/A;   Social History:  reports that she has been smoking cigarettes. She has been smoking an average of 1 pack per day. She has never used smokeless tobacco. She reports that she does not drink alcohol and does not use drugs.  No Known Allergies  Family History  Family history unknown: Yes    Prior to Admission medications   Medication Sig Start Date End Date Taking? Authorizing Provider  amLODipine (NORVASC) 10 MG tablet Take 10 mg by mouth daily.    [provider]  amphetamine-dextroamphetamine (ADDERALL XR) 25 MG 24 hr capsule Take 25 mg by mouth every morning. 08/02/17   [provider]  aspirin 81 MG chewable tablet Chew 81 mg by mouth daily.    [provider]  clonazePAM (KLONOPIN) 2 MG tablet Take 2 mg by mouth 2 (two) times daily.    [provider]  FLUoxetine (PROZAC) 20 MG capsule Take 40 mg by mouth daily.     [provider]  HYDROcodone-acetaminophen (NORCO) 7.5-325 MG tablet Take 1 tablet by mouth every 6 (six) hours as needed for moderate pain.    [provider]  hydrOXYzine (ATARAX/VISTARIL) 25 MG tablet Take 25 mg by mouth 3 (three) times daily as needed for itching.    [provider]  METOPROLOL SUCCINATE PO Take 1 tablet by mouth daily.    [provider]  ondansetron (ZOFRAN) 4 MG tablet Take 4 mg by mouth every 8 (  eight) hours as needed for nausea or vomiting.    [provider]  Specialty Vitamins Products (MENOPAUSE SUPPORT PO) Take 1 tablet by mouth daily.    [provider]    Physical Exam: Vitals:   02/21/22 0300 02/21/22 0324 02/21/22 0407 02/21/22 0845  BP: (!) 214/110  (!) 171/70 (!) 175/105  Pulse: 100  99 99  Resp: 18  15 20   Temp:  98.1 F (36.7 C) 98.7 F (37.1 C) 98.8 F (37.1 C)  TempSrc:   Oral Oral  SpO2: 94%  98% 97%  Weight:       Height:       General: 46 y.o. year-old female ill appearing, but in no acute distress.  Alert and oriented x3. HEENT: NCAT, EOMI Neck: Supple, trachea medial Cardiovascular: Regular rate and rhythm with no rubs or gallops.  No thyromegaly or JVD noted.  No lower extremity edema. 2/4 pulses in all 4 extremities. Respiratory: Clear to auscultation with no wheezes or rales. Good inspiratory effort. Abdomen: Soft, tender to palpation of RUQ.  Nondistended with normal bowel sounds x4 quadrants. Muskuloskeletal: No cyanosis, clubbing or edema noted bilaterally Neuro: CN II-XII intact, strength 5/5 x 4, sensation, reflexes intact Skin: No ulcerative lesions noted or rashes Psychiatry: Judgement and insight appear normal. Mood is appropriate for condition and setting          Labs on Admission:  Basic Metabolic Panel: Recent Labs  Lab 02/20/22 1842 02/21/22 0450  NA 136 136  K 5.0 4.9  CL 103 109  CO2 24 19*  GLUCOSE 123* 142*  BUN 22* 21*  CREATININE 2.10* 2.03*  2.01*  CALCIUM 9.2 8.5*  MG  --  1.8  PHOS  --  3.5   Liver Function Tests: Recent Labs  Lab 02/20/22 1842 02/21/22 0450  AST 17 14*  ALT 15 13  ALKPHOS 131* 103  BILITOT 0.5 0.3  PROT 9.0* 7.4  ALBUMIN 3.8 3.1*   Recent Labs  Lab 02/20/22 1842  LIPASE 32   No results for input(s): "AMMONIA" in the last 168 hours. CBC: Recent Labs  Lab 02/20/22 1842 02/21/22 0450  WBC 11.0* 11.7*  11.7*  HGB 15.3* 13.2  13.3  HCT 45.9 40.3  41.1  MCV 83.8 84.5  84.9  PLT 287 229  226   Cardiac Enzymes: No results for input(s): "CKTOTAL", "CKMB", "CKMBINDEX", "TROPONINI" in the last 168 hours.  BNP (last 3 results) No results for input(s): "BNP" in the last 8760 hours.  ProBNP (last 3 results) No results for input(s): "PROBNP" in the last 8760 hours.  CBG: No results for input(s): "GLUCAP" in the last 168 hours.  Radiological Exams on Admission: No results found.  EKG: I independently viewed the  EKG done and my findings are as followed: EKG was not done in the ED  Assessment/Plan Present on Admission:  Acute cholecystitis  Principal Problem:   Acute cholecystitis Active Problems:   Essential hypertension   Abdominal pain   Nausea   Hypertensive urgency   Elevated alkaline phosphatase level   CKD (chronic kidney disease), stage IV (HCC)  Abdominal pain and nausea in the setting of presumed acute cholecystitis Continue IV hydration Continue IV Zosyn  Continue IV Dilaudid 0.5 mg q.4h p.r.n. for moderate to severe pain Continue IV Zofran  p.r.n. Continue clear liquid diet with plan to advance diet as tolerated Obtain blood culture x2 RUQ ultrasound in the morning General surgery was already consulted and will consult with patient  in the morning per ED physician  Elevated ALP possibly due to above ALP 131, continue to monitor liver enzymes  Hypertensive urgency (resolved) Essential hypertension (uncontrolled) Continue IV hydralazine 10 mg every 6 hours as needed for SBP > 170 Continue amlodipine  CKD stage 3B BUN/creatinine at 22/2.10 (creatinine is within baseline range) Renally adjust medications, avoid nephrotoxic agents/dehydration/hypotension   DVT prophylaxis: Subcu  Code Status: Full code  Consults: General surgery  Family Communication: None at bedside  Severity of Illness: The appropriate patient status for this patient is INPATIENT. Inpatient status is judged to be reasonable and necessary in order to provide the required intensity of service to ensure the patient's safety. The patient's presenting symptoms, physical exam findings, and initial radiographic and laboratory data in the context of their chronic comorbidities is felt to place them at high risk for further clinical deterioration. Furthermore, it is not anticipated that the patient will be medically stable for discharge from the hospital within 2 midnights of admission.   * I certify that at  the point of admission it is my clinical judgment that the patient will require inpatient hospital care spanning beyond 2 midnights from the point of admission due to high intensity of service, high risk for further deterioration and high frequency of surveillance required.*  Author: Bernadette Hoit, DO 02/21/2022 9:16 AM  For on call review [email or web address].

## 2022-02-21 NOTE — Progress Notes (Signed)
PROGRESS NOTE        PATIENT DETAILS Name: Margaret Yoder Age: 46 y.o. Sex: female Date of Birth: 1976-05-04 Admit Date: 02/20/2022 Admitting Physician Bernadette Hoit, DO PXT:GGYIRSW, Shanon Brow, PA-C  Brief Summary: Patient is a 46 y.o.  female with history of HTN, CKD stage IIIb, chronic pain, anxiety/depression-who presented with 1 day history of RUQ pain-found to have acute calculus cholecystitis.   Significant events: 7/3>> admit to [facility] for RUQ pain-found to have acute calculus cholecystitis.  Significant studies: 7/4>> RUQ ultrasound: Cholelithiasis with gallbladder wall thickening.  Significant microbiology data: None  Procedures: None  Consults: General surgery  Subjective: Continues to have persistent RUQ pain.  Objective: Vitals: Blood pressure (!) 175/105, pulse 99, temperature 98.8 F (37.1 C), temperature source Oral, resp. rate 20, height 5\' 3"  (1.6 m), weight 76.7 kg, SpO2 97 %.   Exam: Gen Exam:Alert awake-not in any distress HEENT:atraumatic, normocephalic Chest: B/L clear to auscultation anteriorly CVS:S1S2 regular Abdomen: Soft-tender in the RUQ area.  Positive Murphy sign. Extremities:no edema Neurology: Non focal Skin: no rash  Pertinent Labs/Radiology:    Latest Ref Rng & Units 02/21/2022    4:50 AM 02/20/2022    6:42 PM 02/19/2020   12:10 PM  CBC  WBC 4.0 - 10.5 K/uL 4.0 - 10.5 K/uL 11.7    11.7  11.0  8.2   Hemoglobin 12.0 - 15.0 g/dL 12.0 - 15.0 g/dL 13.3    13.2  15.3  14.5   Hematocrit 36.0 - 46.0 % 36.0 - 46.0 % 41.1    40.3  45.9  43.4   Platelets 150 - 400 K/uL 150 - 400 K/uL 226    229  287  230     Lab Results  Component Value Date   NA 136 02/21/2022   K 4.9 02/21/2022   CL 109 02/21/2022   CO2 19 (L) 02/21/2022      Assessment/Plan: Acute calculus cholecystitis: Continues to have persistent pain-appreciate general surgery evaluation-cholecystectomy scheduled for 7/5-continue IV  antibiotics.  CKD stage IIIb: Creatinine at baseline: Follow periodically.  Hypertensive urgency: BP remains elevated-due to pain-continue amlodipine-add low-dose Coreg-follow and optimize.  Use as needed IV hydralazine/labetalol  BMI: Estimated body mass index is 29.94 kg/m as calculated from the following:   Height as of this encounter: 5\' 3"  (1.6 m).   Weight as of this encounter: 76.7 kg.   Code status:   Code Status: Full Code   DVT Prophylaxis: heparin injection 5,000 Units Start: 02/21/22 0600 SCDs Start: 02/21/22 0404   Family Communication: None at bedside   Disposition Plan: Status is: Inpatient Remains inpatient appropriate because: Acute calculus cholecystitis-laparoscopic cholecystectomy scheduled for 7/5.   Planned Discharge Destination:Home   Diet: Diet Order             Diet clear liquid Room service appropriate? Yes; Fluid consistency: Thin  Diet effective now                     Antimicrobial agents: Anti-infectives (From admission, onward)    Start     Dose/Rate Route Frequency Ordered Stop   02/21/22 0730  piperacillin-tazobactam (ZOSYN) IVPB 3.375 g        3.375 g 12.5 mL/hr over 240 Minutes Intravenous Every 8 hours 02/21/22 0640     02/20/22 2300  cefTRIAXone (ROCEPHIN) 2 g in sodium chloride  0.9 % 100 mL IVPB        2 g 200 mL/hr over 30 Minutes Intravenous  Once 02/20/22 2247 02/20/22 2343        MEDICATIONS: Scheduled Meds:  amLODipine  10 mg Oral Daily   heparin  5,000 Units Subcutaneous Q8H   Continuous Infusions:  sodium chloride 1,000 mL (02/21/22 1034)   piperacillin-tazobactam (ZOSYN)  IV 3.375 g (02/21/22 0904)   PRN Meds:.acetaminophen **OR** acetaminophen, hydrALAZINE, HYDROmorphone (DILAUDID) injection, labetalol, ondansetron **OR** ondansetron (ZOFRAN) IV   I have personally reviewed following labs and imaging studies  LABORATORY DATA: CBC: Recent Labs  Lab 02/20/22 1842 02/21/22 0450  WBC 11.0* 11.7*   11.7*  HGB 15.3* 13.2  13.3  HCT 45.9 40.3  41.1  MCV 83.8 84.5  84.9  PLT 287 229  211    Basic Metabolic Panel: Recent Labs  Lab 02/20/22 1842 02/21/22 0450  NA 136 136  K 5.0 4.9  CL 103 109  CO2 24 19*  GLUCOSE 123* 142*  BUN 22* 21*  CREATININE 2.10* 2.03*  2.01*  CALCIUM 9.2 8.5*  MG  --  1.8  PHOS  --  3.5    GFR: Estimated Creatinine Clearance: 34.3 mL/min (A) (by C-G formula based on SCr of 2.01 mg/dL (H)).  Liver Function Tests: Recent Labs  Lab 02/20/22 1842 02/21/22 0450  AST 17 14*  ALT 15 13  ALKPHOS 131* 103  BILITOT 0.5 0.3  PROT 9.0* 7.4  ALBUMIN 3.8 3.1*   Recent Labs  Lab 02/20/22 1842  LIPASE 32   No results for input(s): "AMMONIA" in the last 168 hours.  Coagulation Profile: No results for input(s): "INR", "PROTIME" in the last 168 hours.  Cardiac Enzymes: No results for input(s): "CKTOTAL", "CKMB", "CKMBINDEX", "TROPONINI" in the last 168 hours.  BNP (last 3 results) No results for input(s): "PROBNP" in the last 8760 hours.  Lipid Profile: No results for input(s): "CHOL", "HDL", "LDLCALC", "TRIG", "CHOLHDL", "LDLDIRECT" in the last 72 hours.  Thyroid Function Tests: No results for input(s): "TSH", "T4TOTAL", "FREET4", "T3FREE", "THYROIDAB" in the last 72 hours.  Anemia Panel: No results for input(s): "VITAMINB12", "FOLATE", "FERRITIN", "TIBC", "IRON", "RETICCTPCT" in the last 72 hours.  Urine analysis:    Component Value Date/Time   COLORURINE STRAW (A) 02/21/2022 0004   APPEARANCEUR CLEAR 02/21/2022 0004   LABSPEC 1.005 02/21/2022 0004   PHURINE 6.0 02/21/2022 0004   GLUCOSEU NEGATIVE 02/21/2022 0004   HGBUR SMALL (A) 02/21/2022 0004   BILIRUBINUR NEGATIVE 02/21/2022 0004   KETONESUR NEGATIVE 02/21/2022 0004   PROTEINUR 100 (A) 02/21/2022 0004   UROBILINOGEN 0.2 11/05/2014 1800   NITRITE NEGATIVE 02/21/2022 0004   LEUKOCYTESUR NEGATIVE 02/21/2022 0004    Sepsis Labs: Lactic Acid, Venous    Component Value  Date/Time   LATICACIDVEN 0.71 05/29/2018 1937    MICROBIOLOGY: No results found for this or any previous visit (from the past 240 hour(s)).  RADIOLOGY STUDIES/RESULTS: US Abdomen Limited RUQ (LIVER/GB)  Result Date: 02/21/2022 CLINICAL DATA:  Right upper quadrant pain. EXAM: ULTRASOUND ABDOMEN LIMITED RIGHT UPPER QUADRANT COMPARISON:  None Available. FINDINGS: Gallbladder: Gallstones evident measuring up to 2.7 cm. Gallbladder wall appears heterogeneous and thickened up to 7 mm. Sonographer reports no sonographic Murphy sign. Common bile duct: Diameter: 9 mm Liver: Increased echogenicity of liver parenchyma suggests fatty deposition. No focal abnormality identified. Portal vein is patent on color Doppler imaging with normal direction of blood flow towards the liver. Other: None. IMPRESSION: 1. Cholelithiasis with  gallbladder wall thickening. Acute cholecystitis is a consideration. If clinical picture is equivocal, nuclear scintigraphy may prove helpful to assess for cystic duct obstruction. 2. Mild dilatation of the common bile duct, indeterminate. Correlation with liver function tests recommended. MRCP/ERCP may prove helpful to assess for choledocholithiasis. Electronically Signed   By: Misty Stanley M.D.   On: 02/21/2022 10:45     LOS: 0 days   Oren Binet, MD  [facility]    To contact the attending provider between 7A-7P or the covering provider during after hours 7P-7A, please log into the web site [email or web address] and access using universal [place] password for that web site. If you do not have the password, please call the hospital operator.  02/21/2022, 10:54 AM

## 2022-02-21 NOTE — Progress Notes (Signed)
Pharmacy Antibiotic Note  Margaret Yoder is a 46 y.o. female admitted on 02/20/2022 with  intra-abdominal infection .  Pharmacy has been consulted for Zosyn dosing. WBC mildly elevated. Noted renal dysfunction.   Plan: Zosyn 3.375G IV q8h to be infused over 4 hours Trend WBC, temp, renal function   Height: 5\' 3"  (160 cm) Weight: 76.7 kg (169 lb) IBW/kg (Calculated) : 52.4  Temp (24hrs), Avg:98.3 F (36.8 C), Min:98.1 F (36.7 C), Max:98.7 F (37.1 C)  Recent Labs  Lab 02/20/22 1842 02/21/22 0450  WBC 11.0* 11.7*  11.7*  CREATININE 2.10*  --     Estimated Creatinine Clearance: 32.8 mL/min (A) (by C-G formula based on SCr of 2.1 mg/dL (H)).    No Known Allergies  Narda Bonds, PharmD, BCPS Clinical Pharmacist Phone: [phone]

## 2022-02-21 NOTE — Consult Note (Signed)
Reason for Consult: Cholecystitis, cholelithiasis Referring Physician: Dr. Francia Greaves is an 46 y.o. female.  HPI: Patient is a 46 year old white female who presented to the emergency room with right upper quadrant abdominal pain and nausea.  She states she underwent a CT scan of the abdomen at another facility and was told she had cholelithiasis with cholecystitis.  They did not have a surgeon available and she was told to follow-up with her primary care physician.  She states she had a similar episode of right upper quadrant abdominal pain and nausea several weeks ago.  It resolved on its own.  This time, the symptoms persisted.  She denies any fever, chills, or jaundice.  Her LFTs in the emergency room were remarkable only for mildly elevated alkaline phosphatase.  Past Medical History:  Diagnosis Date   Anxiety    Chronic back pain    Depression    Gout    Hypertension     Past Surgical History:  Procedure Laterality Date   VIDEO BRONCHOSCOPY N/A 11/11/2014   Procedure: VIDEO BRONCHOSCOPY;  Surgeon: Ivin Poot, MD;  Location: [facility] OR;  Service: Thoracic;  Laterality: N/A;    Family History  Family history unknown: Yes    Social History:  reports that she has been smoking cigarettes. She has been smoking an average of 1 pack per day. She has never used smokeless tobacco. She reports that she does not drink alcohol and does not use drugs.  Allergies: No Known Allergies  Medications: I have reviewed the patient's current medications. Prior to Admission:  Medications Prior to Admission  Medication Sig Dispense Refill Last Dose   aspirin 81 MG chewable tablet Chew 81 mg by mouth daily.   Past Week   amLODipine (NORVASC) 10 MG tablet Take 10 mg by mouth daily. (Patient not taking: Reported on 02/21/2022)   Not Taking   amphetamine-dextroamphetamine (ADDERALL XR) 25 MG 24 hr capsule Take 25 mg by mouth every morning. (Patient not taking: Reported on 02/21/2022)   Not  Taking   clonazePAM (KLONOPIN) 2 MG tablet Take 2 mg by mouth 2 (two) times daily. (Patient not taking: Reported on 02/21/2022)   Not Taking   FLUoxetine (PROZAC) 20 MG capsule Take 40 mg by mouth daily.  (Patient not taking: Reported on 02/21/2022)   Not Taking   HYDROcodone-acetaminophen (NORCO) 7.5-325 MG tablet Take 1 tablet by mouth every 6 (six) hours as needed for moderate pain. (Patient not taking: Reported on 02/21/2022)   Not Taking   hydrOXYzine (ATARAX/VISTARIL) 25 MG tablet Take 25 mg by mouth 3 (three) times daily as needed for itching. (Patient not taking: Reported on 02/21/2022)   Not Taking   ipratropium-albuterol (DUONEB) 0.5-2.5 (3) MG/3ML SOLN Take 3 mLs by nebulization every 4 (four) hours as needed for shortness of breath. (Patient not taking: Reported on 02/21/2022)   Not Taking   METOPROLOL SUCCINATE PO Take 1 tablet by mouth daily. (Patient not taking: Reported on 02/21/2022)   Not Taking   ondansetron (ZOFRAN) 4 MG tablet Take 4 mg by mouth every 8 (eight) hours as needed for nausea or vomiting. (Patient not taking: Reported on 02/21/2022)   Not Taking    Results for orders placed or performed during the hospital encounter of 02/20/22 (from the past 48 hour(s))  Lipase, blood     Status: None   Collection Time: 02/20/22  6:42 PM  Result Value Ref Range   Lipase 32 11 - 51 U/L  Comment: Performed at [facility], [address], [place], [place] [address]  Comprehensive metabolic panel     Status: Abnormal   Collection Time: 02/20/22  6:42 PM  Result Value Ref Range   Sodium 136 135 - 145 mmol/L   Potassium 5.0 3.5 - 5.1 mmol/L   Chloride 103 98 - 111 mmol/L   CO2 24 22 - 32 mmol/L   Glucose, Bld 123 (H) 70 - 99 mg/dL    Comment: Glucose reference range applies only to samples taken after fasting for at least 8 hours.   BUN 22 (H) 6 - 20 mg/dL   Creatinine, Ser 2.10 (H) 0.44 - 1.00 mg/dL   Calcium 9.2 8.9 - 10.3 mg/dL   Total Protein 9.0 (H) 6.5 - 8.1 g/dL   Albumin 3.8 3.5 -  5.0 g/dL   AST 17 15 - 41 U/L   ALT 15 0 - 44 U/L   Alkaline Phosphatase 131 (H) 38 - 126 U/L   Total Bilirubin 0.5 0.3 - 1.2 mg/dL   GFR, Estimated 29 (L) >60 mL/min    Comment: (NOTE) Calculated using the CKD-EPI Creatinine Equation (2021)    Anion gap 9 5 - 15    Comment: Performed at [facility], [address], [place], [place] [address]  CBC     Status: Abnormal   Collection Time: 02/20/22  6:42 PM  Result Value Ref Range   WBC 11.0 (H) 4.0 - 10.5 K/uL   RBC 5.48 (H) 3.87 - 5.11 MIL/uL   Hemoglobin 15.3 (H) 12.0 - 15.0 g/dL   HCT 45.9 36.0 - 46.0 %   MCV 83.8 80.0 - 100.0 fL   MCH 27.9 26.0 - 34.0 pg   MCHC 33.3 30.0 - 36.0 g/dL   RDW 13.2 11.5 - 15.5 %   Platelets 287 150 - 400 K/uL   nRBC 0.0 0.0 - 0.2 %    Comment: Performed at [facility], [address], [place], [place] [address]  Urinalysis, Routine w reflex microscopic Urine, Clean Catch     Status: Abnormal   Collection Time: 02/21/22 12:04 AM  Result Value Ref Range   Color, Urine STRAW (A) YELLOW   APPearance CLEAR CLEAR   Specific Gravity, Urine 1.005 1.005 - 1.030   pH 6.0 5.0 - 8.0   Glucose, UA NEGATIVE NEGATIVE mg/dL   Hgb urine dipstick SMALL (A) NEGATIVE   Bilirubin Urine NEGATIVE NEGATIVE   Ketones, ur NEGATIVE NEGATIVE mg/dL   Protein, ur 100 (A) NEGATIVE mg/dL   Nitrite NEGATIVE NEGATIVE   Leukocytes,Ua NEGATIVE NEGATIVE   RBC / HPF 0-5 0 - 5 RBC/hpf   WBC, UA 0-5 0 - 5 WBC/hpf   Bacteria, UA RARE (A) NONE SEEN   Squamous Epithelial / LPF 0-5 0 - 5   Mucus PRESENT     Comment: Performed at [facility], [address] [place], [place] [address]  Pregnancy, urine     Status: None   Collection Time: 02/21/22 12:04 AM  Result Value Ref Range   Preg Test, Ur NEGATIVE NEGATIVE    Comment:        THE SENSITIVITY OF THIS METHODOLOGY IS >20 mIU/mL. Performed at [facility], [address], [place], [place] [address]   CBC     Status: Abnormal   Collection Time: 02/21/22  4:50 AM  Result Value  Ref Range   WBC 11.7 (H) 4.0 - 10.5 K/uL   RBC 4.84 3.87 - 5.11 MIL/uL   Hemoglobin 13.3  12.0 - 15.0 g/dL   HCT 41.1 36.0 - 46.0 %   MCV 84.9 80.0 - 100.0 fL   MCH 27.5 26.0 - 34.0 pg   MCHC 32.4 30.0 - 36.0 g/dL   RDW 13.2 11.5 - 15.5 %   Platelets 226 150 - 400 K/uL   nRBC 0.0 0.0 - 0.2 %    Comment: Performed at [facility], [address] [place], [place] [address]  Creatinine, serum     Status: Abnormal   Collection Time: 02/21/22  4:50 AM  Result Value Ref Range   Creatinine, Ser 2.01 (H) 0.44 - 1.00 mg/dL   GFR, Estimated 30 (L) >60 mL/min    Comment: (NOTE) Calculated using the CKD-EPI Creatinine Equation (2021) Performed at [facility], [address], [place], [place] [address]   CBC     Status: Abnormal   Collection Time: 02/21/22  4:50 AM  Result Value Ref Range   WBC 11.7 (H) 4.0 - 10.5 K/uL   RBC 4.77 3.87 - 5.11 MIL/uL   Hemoglobin 13.2 12.0 - 15.0 g/dL   HCT 40.3 36.0 - 46.0 %   MCV 84.5 80.0 - 100.0 fL   MCH 27.7 26.0 - 34.0 pg   MCHC 32.8 30.0 - 36.0 g/dL   RDW 13.2 11.5 - 15.5 %   Platelets 229 150 - 400 K/uL   nRBC 0.0 0.0 - 0.2 %    Comment: Performed at [facility], [address] [place], [place] [address]  Comprehensive metabolic panel     Status: Abnormal   Collection Time: 02/21/22  4:50 AM  Result Value Ref Range   Sodium 136 135 - 145 mmol/L   Potassium 4.9 3.5 - 5.1 mmol/L   Chloride 109 98 - 111 mmol/L   CO2 19 (L) 22 - 32 mmol/L   Glucose, Bld 142 (H) 70 - 99 mg/dL    Comment: Glucose reference range applies only to samples taken after fasting for at least 8 hours.   BUN 21 (H) 6 - 20 mg/dL   Creatinine, Ser 2.03 (H) 0.44 - 1.00 mg/dL   Calcium 8.5 (L) 8.9 - 10.3 mg/dL   Total Protein 7.4 6.5 - 8.1 g/dL   Albumin 3.1 (L) 3.5 - 5.0 g/dL   AST 14 (L) 15 - 41 U/L   ALT 13 0 - 44 U/L   Alkaline Phosphatase 103 38 - 126 U/L   Total Bilirubin 0.3 0.3 - 1.2 mg/dL   GFR, Estimated 30 (L) >60 mL/min    Comment: (NOTE) Calculated using the  CKD-EPI Creatinine Equation (2021)    Anion gap 8 5 - 15    Comment: Performed at [facility], [address], [place], [place] [address]  APTT     Status: None   Collection Time: 02/21/22  4:50 AM  Result Value Ref Range   aPTT 29 24 - 36 seconds    Comment: Performed at [facility], [address], [place], [place] [address]  Magnesium     Status: None   Collection Time: 02/21/22  4:50 AM  Result Value Ref Range   Magnesium 1.8 1.7 - 2.4 mg/dL    Comment: Performed at [facility], [address], [place], [place] [address]  Phosphorus     Status: None   Collection Time: 02/21/22  4:50 AM  Result Value Ref Range   Phosphorus 3.5 2.5 - 4.6 mg/dL    Comment: Performed at [facility], [address] [place],  [address]  No results found.  ROS:  Pertinent items are noted in HPI.  Blood pressure (!) 175/105, pulse 99, temperature 98.8 F (37.1 C), temperature source Oral, resp. rate 20, height 5\' 3"  (1.6 m), weight 76.7 kg, SpO2 97 %. Physical Exam: Anxious white female in no acute distress Head is normocephalic, atraumatic Eyes are without scleral icterus Lungs are clear to auscultation with equal breath sounds bilaterally Heart examination reveals regular rate and rhythm without S3, S4, murmurs Abdomen is soft with tenderness down the right upper quadrant to palpation.  No rigidity is noted.  Ultrasound of right upper quadrant pending  Assessment/Plan: Impression: Acute cholecystitis with cholelithiasis.  It is reassuring that her LFTs are for the most part within normal limits.  Her mild leukocytosis is not significantly worsening. Plan: We will proceed with a laparoscopic cholecystectomy on 02/22/2022.  The risks and benefits of the procedure including bleeding, infection, hepatobiliary injury, and the possibility of an open procedure were fully explained to the patient, who gave informed consent.  She is significantly hypertensive but this is being addressed.  Aviva Signs 02/21/2022, 8:59 AM

## 2022-02-21 NOTE — TOC Progression Note (Signed)
  Transition of Care [facility]) Screening Note   Patient Details  Name: Margaret Yoder Date of Birth: 1975/11/05   Transition of Care [facility]) CM/SW Contact:    Boneta Lucks, RN Phone Number: 02/21/2022, 8:55 AM    Transition of Care Department [facility]) has reviewed patient and no TOC needs have been identified at this time. We will continue to monitor patient advancement through interdisciplinary progression rounds. If new patient transition needs arise, please place a TOC consult.       Living arrangements for the past 2 months: [place]

## 2022-02-22 ENCOUNTER — Inpatient Hospital Stay (HOSPITAL_COMMUNITY): Payer: PRIVATE HEALTH INSURANCE | Admitting: Anesthesiology

## 2022-02-22 ENCOUNTER — Encounter (HOSPITAL_COMMUNITY)
Admission: EM | Disposition: A | Discharge status: Home / Self Care | Payer: Self-pay | Source: Home / Self Care | Attending: Internal Medicine

## 2022-02-22 ENCOUNTER — Other Ambulatory Visit: Payer: Self-pay

## 2022-02-22 ENCOUNTER — Encounter (HOSPITAL_COMMUNITY): Payer: Self-pay | Admitting: Internal Medicine

## 2022-02-22 DIAGNOSIS — K821 Hydrops of gallbladder: Secondary | ICD-10-CM

## 2022-02-22 DIAGNOSIS — K810 Acute cholecystitis: Secondary | ICD-10-CM | POA: Diagnosis not present

## 2022-02-22 DIAGNOSIS — I160 Hypertensive urgency: Secondary | ICD-10-CM | POA: Diagnosis not present

## 2022-02-22 DIAGNOSIS — K8000 Calculus of gallbladder with acute cholecystitis without obstruction: Secondary | ICD-10-CM | POA: Diagnosis not present

## 2022-02-22 DIAGNOSIS — F418 Other specified anxiety disorders: Secondary | ICD-10-CM

## 2022-02-22 DIAGNOSIS — I10 Essential (primary) hypertension: Secondary | ICD-10-CM | POA: Diagnosis not present

## 2022-02-22 DIAGNOSIS — N1832 Chronic kidney disease, stage 3b: Secondary | ICD-10-CM | POA: Diagnosis not present

## 2022-02-22 DIAGNOSIS — F17210 Nicotine dependence, cigarettes, uncomplicated: Secondary | ICD-10-CM

## 2022-02-22 HISTORY — PX: CHOLECYSTECTOMY: SHX55

## 2022-02-22 LAB — CBC
HCT: 41.5 % (ref 36.0–46.0)
Hemoglobin: 13.6 g/dL (ref 12.0–15.0)
MCH: 27.4 pg (ref 26.0–34.0)
MCHC: 32.8 g/dL (ref 30.0–36.0)
MCV: 83.7 fL (ref 80.0–100.0)
Platelets: 252 10*3/uL (ref 150–400)
RBC: 4.96 MIL/uL (ref 3.87–5.11)
RDW: 13.2 % (ref 11.5–15.5)
WBC: 13.8 10*3/uL — ABNORMAL HIGH (ref 4.0–10.5)
nRBC: 0 % (ref 0.0–0.2)

## 2022-02-22 LAB — COMPREHENSIVE METABOLIC PANEL
ALT: 10 U/L (ref 0–44)
AST: 11 U/L — ABNORMAL LOW (ref 15–41)
Albumin: 3.1 g/dL — ABNORMAL LOW (ref 3.5–5.0)
Alkaline Phosphatase: 97 U/L (ref 38–126)
Anion gap: 9 (ref 5–15)
BUN: 24 mg/dL — ABNORMAL HIGH (ref 6–20)
CO2: 21 mmol/L — ABNORMAL LOW (ref 22–32)
Calcium: 8.8 mg/dL — ABNORMAL LOW (ref 8.9–10.3)
Chloride: 105 mmol/L (ref 98–111)
Creatinine, Ser: 1.94 mg/dL — ABNORMAL HIGH (ref 0.44–1.00)
GFR, Estimated: 32 mL/min — ABNORMAL LOW (ref >60)
Glucose, Bld: 121 mg/dL — ABNORMAL HIGH (ref 70–99)
Potassium: 4.6 mmol/L (ref 3.5–5.1)
Sodium: 135 mmol/L (ref 135–145)
Total Bilirubin: 0.4 mg/dL (ref 0.3–1.2)
Total Protein: 7.8 g/dL (ref 6.5–8.1)

## 2022-02-22 SURGERY — LAPAROSCOPIC CHOLECYSTECTOMY
Anesthesia: General | Site: Abdomen

## 2022-02-22 MED ORDER — HYDROMORPHONE HCL 1 MG/ML IJ SOLN
0.2500 mg | INTRAMUSCULAR | Status: DC | PRN
  Administered 2022-02-22 (×3): 0.5 mg via INTRAVENOUS
  Filled 2022-02-22: qty 0.5

## 2022-02-22 MED ORDER — CHLORHEXIDINE GLUCONATE 0.12 % MT SOLN
15.0000 mL | Freq: Once | OROMUCOSAL | Status: AC
  Administered 2022-02-22: 15 mL via OROMUCOSAL

## 2022-02-22 MED ORDER — ROCURONIUM BROMIDE 10 MG/ML (PF) SYRINGE
PREFILLED_SYRINGE | INTRAVENOUS | Status: AC
  Filled 2022-02-22: qty 10

## 2022-02-22 MED ORDER — CHLORHEXIDINE GLUCONATE CLOTH 2 % EX PADS
6.0000 | MEDICATED_PAD | Freq: Once | CUTANEOUS | Status: AC
  Administered 2022-02-22: 6 via TOPICAL

## 2022-02-22 MED ORDER — CARVEDILOL 6.25 MG PO TABS: 6.2500 mg | ORAL_TABLET | Freq: Two times a day (BID) | ORAL | 1 refills | Status: DC

## 2022-02-22 MED ORDER — SODIUM CHLORIDE 0.9 % IV SOLN: INTRAVENOUS | Status: DC

## 2022-02-22 MED ORDER — LORAZEPAM 2 MG/ML IJ SOLN: 1.0000 mg | INTRAMUSCULAR | Status: DC | PRN

## 2022-02-22 MED ORDER — ROCURONIUM BROMIDE 10 MG/ML (PF) SYRINGE
PREFILLED_SYRINGE | INTRAVENOUS | Status: DC | PRN
  Administered 2022-02-22: 60 mg via INTRAVENOUS

## 2022-02-22 MED ORDER — MIDAZOLAM HCL 2 MG/2ML IJ SOLN
INTRAMUSCULAR | Status: DC | PRN
  Administered 2022-02-22: 2 mg via INTRAVENOUS

## 2022-02-22 MED ORDER — OXYCODONE HCL 5 MG PO TABS
5.0000 mg | ORAL_TABLET | ORAL | Status: DC | PRN
  Administered 2022-02-22: 10 mg via ORAL
  Filled 2022-02-22: qty 2

## 2022-02-22 MED ORDER — FENTANYL CITRATE PF 50 MCG/ML IJ SOSY: 50.0000 ug | PREFILLED_SYRINGE | INTRAMUSCULAR | Status: DC | PRN

## 2022-02-22 MED ORDER — FENTANYL CITRATE PF 50 MCG/ML IJ SOSY
50.0000 ug | PREFILLED_SYRINGE | INTRAMUSCULAR | Status: DC | PRN
  Administered 2022-02-22: 50 ug via INTRAVENOUS

## 2022-02-22 MED ORDER — LIDOCAINE HCL (PF) 2 % IJ SOLN
INTRAMUSCULAR | Status: AC
  Filled 2022-02-22: qty 5

## 2022-02-22 MED ORDER — SUGAMMADEX SODIUM 200 MG/2ML IV SOLN
INTRAVENOUS | Status: DC | PRN
  Administered 2022-02-22: 160 mg via INTRAVENOUS

## 2022-02-22 MED ORDER — LACTATED RINGERS IV SOLN: INTRAVENOUS | Status: DC

## 2022-02-22 MED ORDER — PROPOFOL 10 MG/ML IV BOLUS
INTRAVENOUS | Status: DC | PRN
  Administered 2022-02-22: 120 mg via INTRAVENOUS

## 2022-02-22 MED ORDER — HEMOSTATIC AGENTS (NO CHARGE) OPTIME
TOPICAL | Status: DC | PRN
  Administered 2022-02-22 (×2): 1

## 2022-02-22 MED ORDER — LACTATED RINGERS IV BOLUS
1000.0000 mL | Freq: Once | INTRAVENOUS | Status: DC
Start: 2022-02-22 — End: 2022-02-22

## 2022-02-22 MED ORDER — AMLODIPINE BESYLATE 10 MG PO TABS: 10.0000 mg | ORAL_TABLET | Freq: Every day | ORAL | 1 refills | Status: DC

## 2022-02-22 MED ORDER — SIMETHICONE 80 MG PO CHEW: 40.0000 mg | CHEWABLE_TABLET | Freq: Four times a day (QID) | ORAL | Status: DC | PRN

## 2022-02-22 MED ORDER — SODIUM CHLORIDE 0.9 % IR SOLN
Status: DC | PRN
  Administered 2022-02-22: 1000 mL

## 2022-02-22 MED ORDER — ENOXAPARIN SODIUM 40 MG/0.4ML IJ SOSY: 40.0000 mg | PREFILLED_SYRINGE | INTRAMUSCULAR | Status: DC

## 2022-02-22 MED ORDER — ONDANSETRON HCL 4 MG/2ML IJ SOLN
INTRAMUSCULAR | Status: AC
  Filled 2022-02-22: qty 2

## 2022-02-22 MED ORDER — FENTANYL CITRATE (PF) 250 MCG/5ML IJ SOLN
INTRAMUSCULAR | Status: AC
  Filled 2022-02-22: qty 5

## 2022-02-22 MED ORDER — ORAL CARE MOUTH RINSE: 15.0000 mL | Freq: Once | OROMUCOSAL | Status: AC

## 2022-02-22 MED ORDER — DEXAMETHASONE SODIUM PHOSPHATE 10 MG/ML IJ SOLN
INTRAMUSCULAR | Status: DC | PRN
  Administered 2022-02-22: 10 mg via INTRAVENOUS

## 2022-02-22 MED ORDER — KETOROLAC TROMETHAMINE 30 MG/ML IJ SOLN
30.0000 mg | Freq: Once | INTRAMUSCULAR | Status: AC
  Administered 2022-02-22: 30 mg via INTRAVENOUS
  Filled 2022-02-22: qty 1

## 2022-02-22 MED ORDER — LIDOCAINE 2% (20 MG/ML) 5 ML SYRINGE
INTRAMUSCULAR | Status: DC | PRN
  Administered 2022-02-22: 30 mg via INTRAVENOUS
  Administered 2022-02-22: 50 mg via INTRAVENOUS

## 2022-02-22 MED ORDER — DEXAMETHASONE SODIUM PHOSPHATE 10 MG/ML IJ SOLN
INTRAMUSCULAR | Status: AC
  Filled 2022-02-22: qty 1

## 2022-02-22 MED ORDER — FENTANYL CITRATE PF 50 MCG/ML IJ SOSY: 50.0000 ug | PREFILLED_SYRINGE | Freq: Once | INTRAMUSCULAR | Status: AC

## 2022-02-22 MED ORDER — FENTANYL CITRATE (PF) 250 MCG/5ML IJ SOLN
INTRAMUSCULAR | Status: DC | PRN
  Administered 2022-02-22: 50 ug via INTRAVENOUS
  Administered 2022-02-22: 100 ug via INTRAVENOUS
  Administered 2022-02-22 (×2): 50 ug via INTRAVENOUS

## 2022-02-22 MED ORDER — OXYCODONE HCL 5 MG PO TABS: 5.0000 mg | ORAL_TABLET | ORAL | 0 refills | Status: DC | PRN

## 2022-02-22 MED ORDER — FENTANYL CITRATE PF 50 MCG/ML IJ SOSY
PREFILLED_SYRINGE | INTRAMUSCULAR | Status: AC
  Filled 2022-02-22: qty 1

## 2022-02-22 MED ORDER — HYDROMORPHONE HCL 1 MG/ML IJ SOLN
1.0000 mg | INTRAMUSCULAR | Status: DC | PRN
  Administered 2022-02-22: 1 mg via INTRAVENOUS
  Filled 2022-02-22: qty 1

## 2022-02-22 MED ORDER — BUPIVACAINE LIPOSOME 1.3 % IJ SUSP
INTRAMUSCULAR | Status: DC | PRN
  Administered 2022-02-22: 20 mL

## 2022-02-22 MED ORDER — MIDAZOLAM HCL 2 MG/2ML IJ SOLN
INTRAMUSCULAR | Status: AC
  Filled 2022-02-22: qty 2

## 2022-02-22 MED ORDER — FENTANYL CITRATE PF 50 MCG/ML IJ SOSY
PREFILLED_SYRINGE | INTRAMUSCULAR | Status: AC
  Administered 2022-02-22: 50 ug via INTRAVENOUS
  Filled 2022-02-22: qty 1

## 2022-02-22 MED ORDER — ONDANSETRON HCL 4 MG/2ML IJ SOLN
INTRAMUSCULAR | Status: DC | PRN
  Administered 2022-02-22: 4 mg via INTRAVENOUS

## 2022-02-22 MED ORDER — SUGAMMADEX SODIUM 500 MG/5ML IV SOLN
INTRAVENOUS | Status: AC
  Filled 2022-02-22: qty 5

## 2022-02-22 SURGICAL SUPPLY — 53 items
ADH SKN CLS APL DERMABOND .7 (GAUZE/BANDAGES/DRESSINGS) ×1
APL ESCP 73.6OZ SRGCL (TIP) ×1
APL PRP STRL LF DISP 70% ISPRP (MISCELLANEOUS) ×1
APPLIER CLIP ROT 10 11.4 M/L (STAPLE) ×2
APR CLP MED LRG 11.4X10 (STAPLE) ×1
CHLORAPREP W/TINT 26 (MISCELLANEOUS) ×3 IMPLANT
CLIP APPLIE ROT 10 11.4 M/L (STAPLE) ×2 IMPLANT
CLOTH BEACON ORANGE TIMEOUT ST (SAFETY) ×3 IMPLANT
COVER LIGHT HANDLE STERIS (MISCELLANEOUS) ×6 IMPLANT
CUTTER FLEX LINEAR 45M (STAPLE) ×1 IMPLANT
DERMABOND ADVANCED (GAUZE/BANDAGES/DRESSINGS) ×1
DERMABOND ADVANCED .7 DNX12 (GAUZE/BANDAGES/DRESSINGS) ×2 IMPLANT
ELECT REM PT RETURN 9FT ADLT (ELECTROSURGICAL) ×2
ELECTRODE REM PT RTRN 9FT ADLT (ELECTROSURGICAL) ×2 IMPLANT
GLOVE BIOGEL PI IND STRL 6.5 (GLOVE) IMPLANT
GLOVE BIOGEL PI IND STRL 7.0 (GLOVE) ×4 IMPLANT
GLOVE BIOGEL PI INDICATOR 6.5 (GLOVE) ×1
GLOVE BIOGEL PI INDICATOR 7.0 (GLOVE) ×2
GLOVE ECLIPSE 6.5 STRL STRAW (GLOVE) ×1 IMPLANT
GLOVE SURG SS PI 6.5 STRL IVOR (GLOVE) ×1 IMPLANT
GLOVE SURG SS PI 7.5 STRL IVOR (GLOVE) ×3 IMPLANT
GOWN STRL REUS W/TWL LRG LVL3 (GOWN DISPOSABLE) ×9 IMPLANT
HEMOSTAT SNOW SURGICEL 2X4 (HEMOSTASIS) ×3 IMPLANT
INST SET LAPROSCOPIC AP (KITS) ×3 IMPLANT
KIT TURNOVER KIT A (KITS) ×3 IMPLANT
MANIFOLD NEPTUNE II (INSTRUMENTS) ×3 IMPLANT
NDL HYPO 18GX1.5 BLUNT FILL (NEEDLE) ×2 IMPLANT
NDL HYPO 21X1.5 SAFETY (NEEDLE) ×2 IMPLANT
NDL INSUFFLATION 14GA 120MM (NEEDLE) ×2 IMPLANT
NEEDLE HYPO 18GX1.5 BLUNT FILL (NEEDLE) ×2 IMPLANT
NEEDLE HYPO 21X1.5 SAFETY (NEEDLE) ×2 IMPLANT
NEEDLE INSUFFLATION 14GA 120MM (NEEDLE) ×2 IMPLANT
NS IRRIG 1000ML POUR BTL (IV SOLUTION) ×3 IMPLANT
PACK LAP CHOLE LZT030E (CUSTOM PROCEDURE TRAY) ×3 IMPLANT
PAD ARMBOARD 7.5X6 YLW CONV (MISCELLANEOUS) ×3 IMPLANT
PENCIL HANDSWITCHING (ELECTRODE) ×1 IMPLANT
POWDER SURGICEL 3.0 GRAM (HEMOSTASIS) ×1 IMPLANT
RELOAD 45 VASCULAR/THIN (ENDOMECHANICALS) ×2 IMPLANT
RELOAD STAPLE 45 2.5 WHT GRN (ENDOMECHANICALS) IMPLANT
SET BASIN LINEN APH (SET/KITS/TRAYS/PACK) ×3 IMPLANT
SET TUBE SMOKE EVAC HIGH FLOW (TUBING) ×3 IMPLANT
SLEEVE Z-THREAD 5X100MM (TROCAR) ×3 IMPLANT
SUT MNCRL AB 4-0 PS2 18 (SUTURE) ×6 IMPLANT
SUT VICRYL 0 UR6 27IN ABS (SUTURE) ×4 IMPLANT
SYR 20ML LL LF (SYRINGE) ×6 IMPLANT
SYS BAG RETRIEVAL 10MM (BASKET) ×2
SYSTEM BAG RETRIEVAL 10MM (BASKET) ×2 IMPLANT
TIP ENDOSCOPIC SURGICEL (TIP) ×1 IMPLANT
TROCAR Z-THRD FIOS HNDL 11X100 (TROCAR) ×3 IMPLANT
TROCAR Z-THREAD FIOS 5X100MM (TROCAR) ×3 IMPLANT
TROCAR Z-THREAD SLEEVE 11X100 (TROCAR) ×3 IMPLANT
TUBE CONNECTING 12X1/4 (SUCTIONS) ×3 IMPLANT
WARMER LAPAROSCOPE (MISCELLANEOUS) ×3 IMPLANT

## 2022-02-22 NOTE — Transfer of Care (Signed)
Immediate Anesthesia Transfer of Care Note  Patient: Margaret Yoder  Procedure(s) Performed: LAPAROSCOPIC CHOLECYSTECTOMY (Abdomen)  Patient Location: PACU  Anesthesia Type:General  Level of Consciousness: awake, alert  and oriented  Airway & Oxygen Therapy: Patient Spontanous Breathing and Patient connected to nasal cannula oxygen  Post-op Assessment: Report given to RN and Post -op Vital signs reviewed and stable  Post vital signs: Reviewed and stable  Last Vitals:  Vitals Value Taken Time  BP 151/97 02/22/22 1335  Temp    Pulse 94 02/22/22 1336  Resp 13 02/22/22 1336  SpO2 99 % 02/22/22 1336  Vitals shown include unvalidated device data.  Last Pain:  Vitals:   02/22/22 1052  TempSrc:   PainSc: 7       Patients Stated Pain Goal: 4 (97/02/63 7858)  Complications: No notable events documented.

## 2022-02-22 NOTE — Anesthesia Postprocedure Evaluation (Signed)
Anesthesia Post Note  Patient: Margaret Yoder  Procedure(s) Performed: LAPAROSCOPIC CHOLECYSTECTOMY (Abdomen)  Patient location during evaluation: Phase II Anesthesia Type: General Level of consciousness: awake and alert and oriented Pain management: pain level controlled Vital Signs Assessment: post-procedure vital signs reviewed and stable Respiratory status: spontaneous breathing, nonlabored ventilation and respiratory function stable Cardiovascular status: blood pressure returned to baseline and stable Postop Assessment: no apparent nausea or vomiting Anesthetic complications: no   No notable events documented.   Last Vitals:  Vitals:   02/22/22 1415 02/22/22 1432  BP: (!) 163/85 (!) 173/107  Pulse: 88 92  Resp: 15 18  Temp: 36.8 C 36.9 C  SpO2: 97% 100%    Last Pain:  Vitals:   02/22/22 1432  TempSrc:   PainSc: 10-Worst pain ever                 Graceland Wachter C Lavone Barrientes

## 2022-02-22 NOTE — Interval H&P Note (Signed)
History and Physical Interval Note:  02/22/2022 9:40 AM  [place]  has presented today for surgery, with the diagnosis of acute cholecystitis, cholelithiasis.  The various methods of treatment have been discussed with the patient and family. After consideration of risks, benefits and other options for treatment, the patient has consented to  Procedure(s): LAPAROSCOPIC CHOLECYSTECTOMY (N/A) as a surgical intervention.  The patient's history has been reviewed, patient examined, no change in status, stable for surgery.  I have reviewed the patient's chart and labs.  Questions were answered to the patient's satisfaction.     Aviva Signs

## 2022-02-22 NOTE — Anesthesia Procedure Notes (Signed)
Procedure Name: Intubation Date/Time: 02/22/2022 12:30 PM  Performed by: Myna Bright, CRNAPre-anesthesia Checklist: Patient identified, Emergency Drugs available, Suction available, Patient being monitored and Timeout performed Patient Re-evaluated:Patient Re-evaluated prior to induction Oxygen Delivery Method: Circle system utilized Preoxygenation: Pre-oxygenation with 100% oxygen Induction Type: IV induction Ventilation: Mask ventilation with difficulty Laryngoscope Size: Miller and 2 Grade View: Grade II Tube type: Oral Tube size: 7.0 mm Number of attempts: 1 Airway Equipment and Method: Stylet Placement Confirmation: ETT inserted through vocal cords under direct vision, positive ETCO2, CO2 detector and breath sounds checked- equal and bilateral Secured at: 21 cm Tube secured with: Tape Dental Injury: Teeth and Oropharynx as per pre-operative assessment

## 2022-02-22 NOTE — Progress Notes (Signed)
Nsg Discharge Note  Admit Date:  02/20/2022 Discharge date: 02/22/2022   Harlene Ramus to be D/C'd Home per MD order.  AVS completed.  Copy for chart, and copy for patient signed, and dated. Patient/caregiver able to verbalize understanding.  Discharge Medication: Allergies as of 02/22/2022   No Known Allergies      Medication List     STOP taking these medications    amphetamine-dextroamphetamine 25 MG 24 hr capsule Commonly known as: ADDERALL XR   aspirin 81 MG chewable tablet   clonazePAM 2 MG tablet Commonly known as: KLONOPIN   FLUoxetine 20 MG capsule Commonly known as: PROZAC   HYDROcodone-acetaminophen 7.5-325 MG tablet Commonly known as: NORCO   hydrOXYzine 25 MG tablet Commonly known as: ATARAX   ipratropium-albuterol 0.5-2.5 (3) MG/3ML Soln Commonly known as: DUONEB   METOPROLOL SUCCINATE PO   ondansetron 4 MG tablet Commonly known as: ZOFRAN       TAKE these medications    amLODipine 10 MG tablet Commonly known as: NORVASC Take 1 tablet (10 mg total) by mouth daily.   carvedilol 6.25 MG tablet Commonly known as: COREG Take 1 tablet (6.25 mg total) by mouth 2 (two) times daily with a meal.   oxyCODONE 5 MG immediate release tablet Commonly known as: Roxicodone Take 1 tablet (5 mg total) by mouth every 4 (four) hours as needed for severe pain.               Discharge Care Instructions  (From admission, onward)           Start     Ordered   02/22/22 0000  No dressing needed        02/22/22 1618            Discharge Assessment: Vitals:   02/22/22 1432 02/22/22 1615  BP: (!) 173/107 (!) 154/92  Pulse: 92 89  Resp: 18 17  Temp: 98.4 F (36.9 C) 99 F (37.2 C)  SpO2: 100% 99%   Skin clean, dry and intact without evidence of skin break down, no evidence of skin tears noted. IV catheter discontinued intact. Site without signs and symptoms of complications - no redness or edema noted at insertion site, patient denies  c/o pain - only slight tenderness at site.  Dressing with slight pressure applied.  D/c Instructions-Education: Discharge instructions given to patient/family with verbalized understanding. D/c education completed with patient/family including follow up instructions, medication list, d/c activities limitations if indicated, with other d/c instructions as indicated by MD - patient able to verbalize understanding, all questions fully answered. Patient instructed to return to ED, call 911, or call MD for any changes in condition.  Patient escorted via [place], and D/C home via private auto.  Clovis Fredrickson, LPN 0/10/4740 5:95 PM

## 2022-02-22 NOTE — Op Note (Signed)
Patient:  Margaret Yoder  DOB:  11/30/1975  MRN:  [id number]   Preop Diagnosis: Cholecystitis, cholelithiasis  Postop Diagnosis: Same, hydrops of gallbladder  Procedure: Laparoscopic cholecystectomy  Surgeon: Aviva Signs, MD  Assistant: Curlene Labrum, MD  Anes: General endotracheal  Indications: Patient is a 46 year old white female who presents with acute cholecystitis secondary to cholelithiasis.  The risks and benefits of the procedure including bleeding, infection, hepatobiliary injury, and the possibility of an open procedure were fully explained to the patient, who gave informed consent.  Procedure note: The patient was placed in the supine position.  After induction of general endotracheal anesthesia, the abdomen was prepped and draped using the usual sterile technique with ChloraPrep.  Surgical site confirmation was performed.  A supraumbilical incision was made down to the fascia.  A Veress needle was introduced into the abdominal cavity and confirmation of placement was done using the saline drop test.  The abdomen was then insufflated to 15 mmHg pressure.  An 11 mm trocar was introduced into the abdominal cavity under direct visualization without difficulty.  The patient was placed in reverse Trendelenburg position and an additional 11 mm trocar was placed in the epigastric region and 5 mm trocars were placed in the right upper quadrant and right flank regions.  The liver was inspected and noted to be within normal limits.  The gallbladder was distended and tense with a thickened gallbladder wall.  In order to provide the exposure, the gallbladder was decompressed.  Hydrops of the gallbladder was found.  The gallbladder was then retracted in a dynamic fashion in order to provide a critical view of the triangle of Calot.  The cystic duct was first identified.  Its junction to the infundibulum was fully identified.  A vascular load Endo GIA was placed across the cystic duct and  fired.  The cystic artery was ligated and divided using clips.  The gallbladder was freed away from the gallbladder fossa using Bovie electrocautery.  The gallbladder was delivered through the epigastric trocar site using an Endo Catch bag.  The gallbladder fossa was inspected and no abnormal bleeding or bile leakage was noted.  Surgicel was placed in the gallbladder fossa.  All fluid and air were then evacuated from the abdominal cavity prior to removal of the trocars.  Dr. Constance Haw was present for the whole procedure and helped facilitate dissection, retraction, and removal of the gallbladder as well as closure of multiple abdominal wounds.  All wounds were irrigated with normal saline.  All wounds were injected with Exparel.  The supraumbilical fascia as well as epigastric fascia were reapproximated using 0 Vicryl interrupted sutures.  All skin incisions were closed using a 4-0 Monocryl subcuticular suture.  Dermabond was applied.  All tape and needle counts were correct at the end of the procedure.  The patient was extubated in the operating room and transferred to PACU in stable condition.  Complications: None  EBL: Minimal  Specimen: Gallbladder

## 2022-02-22 NOTE — Discharge Summary (Signed)
Physician Discharge Summary  Margaret Yoder UEA:[id number] DOB: 10-23-1975 DOA: 02/20/2022  PCP: Candi Leash, PA-C  Admit date: 02/20/2022 Discharge date: 02/22/2022  Admitted From: Home Disposition: Home  Recommendations for Outpatient Follow-up:  Follow up with PCP in 1-2 weeks Please obtain BMP/CBC in one week Follow-up with general surgery will be scheduled   Discharge Condition: Stable CODE STATUS: Full code Diet recommendation: Heart healthy  Brief/Interim Summary: Patient is a 46 y.o.  female with history of HTN, CKD stage IIIb, chronic pain, anxiety/depression-who presented with 1 day history of RUQ pain-found to have acute calculus cholecystitis.  Discharge Diagnoses:  Principal Problem:   Acute cholecystitis Active Problems:   Essential hypertension   Abdominal pain   Nausea   Hypertensive urgency   Elevated alkaline phosphatase level   Chronic kidney disease, stage 3b ([place])  1.  Acute calculus cholecystitis.  Seen by general surgery and underwent cholecystectomy on 7/5.  Surgery was uneventful.  No antibiotics on discharge per general surgery.  Follow-up will be scheduled with Dr. Arnoldo Morale.  2.  Chronic kidney disease stage IIIb.  Creatinine is currently at baseline.  3.  Hypertensive urgency.  Patient was continued on amlodipine.  Coreg was also added to optimize her blood pressure.  This can be further followed by her primary care physician for further adjustments.  Discharge Instructions  Discharge Instructions     Diet - low sodium heart healthy   Complete by: As directed    Increase activity slowly   Complete by: As directed    No dressing needed   Complete by: As directed       Allergies as of 02/22/2022   No Known Allergies      Medication List     STOP taking these medications    amphetamine-dextroamphetamine 25 MG 24 hr capsule Commonly known as: ADDERALL XR   aspirin 81 MG chewable tablet   clonazePAM 2 MG tablet Commonly known  as: KLONOPIN   FLUoxetine 20 MG capsule Commonly known as: PROZAC   HYDROcodone-acetaminophen 7.5-325 MG tablet Commonly known as: NORCO   hydrOXYzine 25 MG tablet Commonly known as: ATARAX   ipratropium-albuterol 0.5-2.5 (3) MG/3ML Soln Commonly known as: DUONEB   METOPROLOL SUCCINATE PO   ondansetron 4 MG tablet Commonly known as: ZOFRAN       TAKE these medications    amLODipine 10 MG tablet Commonly known as: NORVASC Take 1 tablet (10 mg total) by mouth daily.   carvedilol 6.25 MG tablet Commonly known as: COREG Take 1 tablet (6.25 mg total) by mouth 2 (two) times daily with a meal.   oxyCODONE 5 MG immediate release tablet Commonly known as: Roxicodone Take 1 tablet (5 mg total) by mouth every 4 (four) hours as needed for severe pain.               Discharge Care Instructions  (From admission, onward)           Start     Ordered   02/22/22 0000  No dressing needed        02/22/22 1618            Follow-up Information     Aviva Signs, MD Follow up.   Specialty: General Surgery Why: As needed.  Will call you in two weeks for follow up. Contact information: [address] [address] [phone]                No Known Allergies  Consultations: General surgery  Procedures/Studies: US Abdomen Limited RUQ (LIVER/GB)  Result Date: 02/21/2022 CLINICAL DATA:  Right upper quadrant pain. EXAM: ULTRASOUND ABDOMEN LIMITED RIGHT UPPER QUADRANT COMPARISON:  None Available. FINDINGS: Gallbladder: Gallstones evident measuring up to 2.7 cm. Gallbladder wall appears heterogeneous and thickened up to 7 mm. Sonographer reports no sonographic Murphy sign. Common bile duct: Diameter: 9 mm Liver: Increased echogenicity of liver parenchyma suggests fatty deposition. No focal abnormality identified. Portal vein is patent on color Doppler imaging with normal direction of blood flow towards the liver. Other: None. IMPRESSION: 1.  Cholelithiasis with gallbladder wall thickening. Acute cholecystitis is a consideration. If clinical picture is equivocal, nuclear scintigraphy may prove helpful to assess for cystic duct obstruction. 2. Mild dilatation of the common bile duct, indeterminate. Correlation with liver function tests recommended. MRCP/ERCP may prove helpful to assess for choledocholithiasis. Electronically Signed   By: Misty Stanley M.D.   On: 02/21/2022 10:45      Subjective: Seen in her room postoperatively.  Tolerating diet.  Discharge Exam: Vitals:   02/22/22 1400 02/22/22 1415 02/22/22 1432 02/22/22 1615  BP: (!) 157/93 (!) 163/85 (!) 173/107 (!) 154/92  Pulse: 86 88 92 89  Resp: (!) 8 15 18 17   Temp:  98.3 F (36.8 C) 98.4 F (36.9 C) 99 F (37.2 C)  TempSrc:      SpO2: 97% 97% 100% 99%  Weight:      Height:        General: Pt is alert, awake, not in acute distress Cardiovascular: RRR, S1/S2 +, no rubs, no gallops Respiratory: CTA bilaterally, no wheezing, no rhonchi Abdominal: Soft, NT, ND, bowel sounds + Extremities: no edema, no cyanosis    The results of significant diagnostics from this hospitalization (including imaging, microbiology, ancillary and laboratory) are listed below for reference.     Microbiology: Recent Results (from the past 240 hour(s))  MRSA Next Gen by PCR, Nasal     Status: None   Collection Time: 02/21/22  5:51 PM   Specimen: Nasal Mucosa; Nasal Swab  Result Value Ref Range Status   MRSA by PCR Next Gen NOT DETECTED NOT DETECTED Final    Comment: (NOTE) The GeneXpert MRSA Assay (FDA approved for NASAL specimens only), is one component of a comprehensive MRSA colonization surveillance program. It is not intended to diagnose MRSA infection nor to guide or monitor treatment for MRSA infections. Test performance is not FDA approved in patients less than 46 years old. Performed at [facility], [address] [place], [place] [address]      Labs: BNP (last 3  results) No results for input(s): "BNP" in the last 8760 hours. Basic Metabolic Panel: Recent Labs  Lab 02/20/22 1842 02/21/22 0450 02/22/22 0501  NA 136 136 135  K 5.0 4.9 4.6  CL 103 109 105  CO2 24 19* 21*  GLUCOSE 123* 142* 121*  BUN 22* 21* 24*  CREATININE 2.10* 2.03*  2.01* 1.94*  CALCIUM 9.2 8.5* 8.8*  MG  --  1.8  --   PHOS  --  3.5  --    Liver Function Tests: Recent Labs  Lab 02/20/22 1842 02/21/22 0450 02/22/22 0501  AST 17 14* 11*  ALT 15 13 10   ALKPHOS 131* 103 97  BILITOT 0.5 0.3 0.4  PROT 9.0* 7.4 7.8  ALBUMIN 3.8 3.1* 3.1*   Recent Labs  Lab 02/20/22 1842  LIPASE 32   No results for input(s): "AMMONIA" in the last 168 hours. CBC: Recent Labs  Lab 02/20/22  1842 02/21/22 0450 02/22/22 0501  WBC 11.0* 11.7*  11.7* 13.8*  HGB 15.3* 13.2  13.3 13.6  HCT 45.9 40.3  41.1 41.5  MCV 83.8 84.5  84.9 83.7  PLT 287 229  226 252   Cardiac Enzymes: No results for input(s): "CKTOTAL", "CKMB", "CKMBINDEX", "TROPONINI" in the last 168 hours. BNP: Invalid input(s): "POCBNP" CBG: No results for input(s): "GLUCAP" in the last 168 hours. D-Dimer No results for input(s): "DDIMER" in the last 72 hours. Hgb A1c No results for input(s): "HGBA1C" in the last 72 hours. Lipid Profile No results for input(s): "CHOL", "HDL", "LDLCALC", "TRIG", "CHOLHDL", "LDLDIRECT" in the last 72 hours. Thyroid function studies No results for input(s): "TSH", "T4TOTAL", "T3FREE", "THYROIDAB" in the last 72 hours.  Invalid input(s): "FREET3" Anemia work up No results for input(s): "VITAMINB12", "FOLATE", "FERRITIN", "TIBC", "IRON", "RETICCTPCT" in the last 72 hours. Urinalysis    Component Value Date/Time   COLORURINE STRAW (A) 02/21/2022 0004   APPEARANCEUR CLEAR 02/21/2022 0004   LABSPEC 1.005 02/21/2022 0004   PHURINE 6.0 02/21/2022 0004   GLUCOSEU NEGATIVE 02/21/2022 0004   HGBUR SMALL (A) 02/21/2022 0004   BILIRUBINUR NEGATIVE 02/21/2022 0004   KETONESUR  NEGATIVE 02/21/2022 0004   PROTEINUR 100 (A) 02/21/2022 0004   UROBILINOGEN 0.2 11/05/2014 1800   NITRITE NEGATIVE 02/21/2022 0004   LEUKOCYTESUR NEGATIVE 02/21/2022 0004   Sepsis Labs Recent Labs  Lab 02/20/22 1842 02/21/22 0450 02/22/22 0501  WBC 11.0* 11.7*  11.7* 13.8*   Microbiology Recent Results (from the past 240 hour(s))  MRSA Next Gen by PCR, Nasal     Status: None   Collection Time: 02/21/22  5:51 PM   Specimen: Nasal Mucosa; Nasal Swab  Result Value Ref Range Status   MRSA by PCR Next Gen NOT DETECTED NOT DETECTED Final    Comment: (NOTE) The GeneXpert MRSA Assay (FDA approved for NASAL specimens only), is one component of a comprehensive MRSA colonization surveillance program. It is not intended to diagnose MRSA infection nor to guide or monitor treatment for MRSA infections. Test performance is not FDA approved in patients less than 66 years old. Performed at [facility], [address] [place], [place] [address]      Time coordinating discharge: 60mins  SIGNED:   Kathie Dike, MD  [facility] 02/22/2022, 6:39 PM   If 7PM-7AM, please contact night-coverage [email or web address]

## 2022-02-22 NOTE — Interval H&P Note (Signed)
History and Physical Interval Note:  02/22/2022 10:18 AM  Margaret Yoder  has presented today for surgery, with the diagnosis of acute cholecystitis, cholelithiasis.  The various methods of treatment have been discussed with the patient and family. After consideration of risks, benefits and other options for treatment, the patient has consented to  Procedure(s): LAPAROSCOPIC CHOLECYSTECTOMY (N/A) as a surgical intervention.  The patient's history has been reviewed, patient examined, no change in status, stable for surgery.  I have reviewed the patient's chart and labs.  Questions were answered to the patient's satisfaction.     Aviva Signs

## 2022-02-22 NOTE — Progress Notes (Signed)
   02/22/22 0601  Vitals  BP (!) 166/116  MAP (mmHg) 132  BP Location Right Arm  BP Method Automatic  Patient Position (if appropriate) Sitting  Pulse Rate 97  Pulse Rate Source Dinamap  Resp 18  MEWS COLOR  MEWS Score Color Green  Oxygen Therapy  SpO2 99 %  O2 Device Room Air  Pain Assessment  Pain Scale 0-10  Pain Score 10  Pain Type Acute pain  Pain Location Abdomen  Pain Orientation Right;Upper  Pain Descriptors / Indicators Aching;Discomfort;Shooting  Pain Frequency Constant  Pain Onset On-going  Patients Stated Pain Goal 0  Pain Intervention(s) Pain med given for lower pain score than stated, per patient request  Multiple Pain Sites No  MEWS Score  MEWS Temp 0  MEWS Systolic 0  MEWS Pulse 0  MEWS RR 0  MEWS LOC 0  MEWS Score 0   Patients BP elevated to 181/92 at 05:30 rechecked vitals BP 166/116, pulse 97. Dilaudid given for abdominal pain and hydralazine given for blood pressure. MD  and charge nurse notified.

## 2022-02-22 NOTE — Anesthesia Preprocedure Evaluation (Addendum)
Anesthesia Evaluation  Patient identified by MRN, date of birth, ID band Patient awake    Reviewed: Allergy & Precautions, NPO status , Patient's Chart, lab work & pertinent test results  Airway Mallampati: II  TM Distance: >3 FB Neck ROM: Full    Dental  (+) Dental Advisory Given, Missing   Pulmonary pneumonia, resolved, Current Smoker and Patient abstained from smoking.,    Pulmonary exam normal breath sounds clear to auscultation       Cardiovascular Exercise Tolerance: Good hypertension, Pt. on medications  Rhythm:Regular Rate:Tachycardia     Neuro/Psych PSYCHIATRIC DISORDERS Anxiety Depression negative neurological ROS     GI/Hepatic negative GI ROS, Neg liver ROS,   Endo/Other  negative endocrine ROS  Renal/GU Renal InsufficiencyRenal disease  negative genitourinary   Musculoskeletal negative musculoskeletal ROS (+)   Abdominal   Peds negative pediatric ROS (+) ADHD Hematology  (+) Blood dyscrasia, anemia ,   Anesthesia Other Findings   Reproductive/Obstetrics negative OB ROS                            Anesthesia Physical Anesthesia Plan  ASA: 3  Anesthesia Plan: General   Post-op Pain Management: Dilaudid IV   Induction: Intravenous  PONV Risk Score and Plan: 4 or greater and Ondansetron, Dexamethasone and Midazolam  Airway Management Planned: Oral ETT  Additional Equipment:   Intra-op Plan:   Post-operative Plan: Extubation in OR  Informed Consent: I have reviewed the patients History and Physical, chart, labs and discussed the procedure including the risks, benefits and alternatives for the proposed anesthesia with the patient or authorized representative who has indicated his/her understanding and acceptance.     Dental advisory given  Plan Discussed with: CRNA and Surgeon  Anesthesia Plan Comments:         Anesthesia Quick Evaluation

## 2022-02-23 ENCOUNTER — Other Ambulatory Visit (INDEPENDENT_AMBULATORY_CARE_PROVIDER_SITE_OTHER): Payer: PRIVATE HEALTH INSURANCE | Admitting: General Surgery

## 2022-02-23 ENCOUNTER — Telehealth: Payer: Self-pay | Admitting: *Deleted

## 2022-02-23 ENCOUNTER — Encounter (HOSPITAL_COMMUNITY): Payer: Self-pay | Admitting: General Surgery

## 2022-02-23 DIAGNOSIS — Z09 Encounter for follow-up examination after completed treatment for conditions other than malignant neoplasm: Secondary | ICD-10-CM

## 2022-02-23 LAB — SURGICAL PATHOLOGY

## 2022-02-23 MED ORDER — OXYCODONE HCL 5 MG PO TABS: 5.0000 mg | ORAL_TABLET | ORAL | 0 refills | Status: DC | PRN

## 2022-02-23 NOTE — Telephone Encounter (Signed)
Received call from patient [phone]~ telephone.   Surgical Date: 02/22/2022 Procedure: Lap Chole  Oxycodone 5mg  unavailable at [facility]. Requested to have prescription sent to [facility].   Allergies: NKDA

## 2022-02-23 NOTE — Telephone Encounter (Signed)
Call placed to patient and patient made aware per VM.  

## 2022-02-23 NOTE — Progress Notes (Signed)
Switched Pharmacy has the pharmacy did not have the [place].

## 2022-02-27 ENCOUNTER — Telehealth: Payer: Self-pay | Admitting: *Deleted

## 2022-02-28 NOTE — Telephone Encounter (Signed)
Received call from patient [phone]~ telephone.   Surgical Date: 02/22/2022 Procedure: Lap Chole  Patient reports that she was advised that she could return to work on 03/01/2022, but is requesting more time off due to pain.   Discussed with Dr. Arnoldo Morale and was advised patient can return to work on 03/13/2022.   Call placed to patient. [place].

## 2022-03-01 NOTE — Telephone Encounter (Signed)
Call placed to patient and patient made aware.   Letter transcribed.

## 2022-03-10 ENCOUNTER — Encounter: Payer: Self-pay | Admitting: General Surgery

## 2022-03-10 ENCOUNTER — Ambulatory Visit (INDEPENDENT_AMBULATORY_CARE_PROVIDER_SITE_OTHER): Payer: PRIVATE HEALTH INSURANCE | Admitting: General Surgery

## 2022-03-10 DIAGNOSIS — Z09 Encounter for follow-up examination after completed treatment for conditions other than malignant neoplasm: Secondary | ICD-10-CM

## 2022-03-10 NOTE — Progress Notes (Signed)
Subjective:     Margaret Yoder  Postoperative virtual telephone visit performed with patient.  Patient denies any fever or chills.  She has no incisional pain.  She is having some bowel urgency soon after eating.  She denies any nausea or vomiting. Objective:    There were no vitals taken for this visit.  General:  Telephone visit  Final pathology consistent with diagnosis.     Assessment:    Doing well postoperatively. Some bowel urgency after eating    Plan:   I told the patient to try Imodium A-D over-the-counter.  This should resolve with time.  I told her to call me back in 4 to 6 weeks should it not resolve.  She understands and agrees.  As this was a part of the total global surgical fee, this was not a billable visit.  Total telephone time was 2 minutes.

## 2022-03-17 ENCOUNTER — Inpatient Hospital Stay (HOSPITAL_COMMUNITY): Payer: 59

## 2022-03-17 ENCOUNTER — Emergency Department (HOSPITAL_COMMUNITY): Payer: 59

## 2022-03-17 ENCOUNTER — Other Ambulatory Visit: Payer: Self-pay

## 2022-03-17 ENCOUNTER — Encounter (HOSPITAL_COMMUNITY): Payer: Self-pay | Admitting: Emergency Medicine

## 2022-03-17 ENCOUNTER — Inpatient Hospital Stay (HOSPITAL_COMMUNITY)
Admission: EM | Admit: 2022-03-17 | Discharge: 2022-03-22 | DRG: 393 | Disposition: A | Discharge status: Home / Self Care | Payer: 59 | Attending: Internal Medicine | Admitting: Internal Medicine

## 2022-03-17 DIAGNOSIS — K2091 Esophagitis, unspecified with bleeding: Secondary | ICD-10-CM | POA: Diagnosis present

## 2022-03-17 DIAGNOSIS — K2960 Other gastritis without bleeding: Secondary | ICD-10-CM | POA: Diagnosis not present

## 2022-03-17 DIAGNOSIS — D649 Anemia, unspecified: Secondary | ICD-10-CM

## 2022-03-17 DIAGNOSIS — K2971 Gastritis, unspecified, with bleeding: Secondary | ICD-10-CM | POA: Diagnosis present

## 2022-03-17 DIAGNOSIS — T8189XA Other complications of procedures, not elsewhere classified, initial encounter: Secondary | ICD-10-CM | POA: Diagnosis present

## 2022-03-17 DIAGNOSIS — F419 Anxiety disorder, unspecified: Secondary | ICD-10-CM

## 2022-03-17 DIAGNOSIS — R799 Abnormal finding of blood chemistry, unspecified: Secondary | ICD-10-CM | POA: Diagnosis not present

## 2022-03-17 DIAGNOSIS — T8189XS Other complications of procedures, not elsewhere classified, sequela: Secondary | ICD-10-CM | POA: Diagnosis not present

## 2022-03-17 DIAGNOSIS — K2289 Other specified disease of esophagus: Secondary | ICD-10-CM | POA: Diagnosis not present

## 2022-03-17 DIAGNOSIS — K668 Other specified disorders of peritoneum: Secondary | ICD-10-CM | POA: Diagnosis not present

## 2022-03-17 DIAGNOSIS — F17210 Nicotine dependence, cigarettes, uncomplicated: Secondary | ICD-10-CM | POA: Diagnosis present

## 2022-03-17 DIAGNOSIS — R1084 Generalized abdominal pain: Secondary | ICD-10-CM

## 2022-03-17 DIAGNOSIS — G894 Chronic pain syndrome: Secondary | ICD-10-CM | POA: Diagnosis not present

## 2022-03-17 DIAGNOSIS — R1011 Right upper quadrant pain: Secondary | ICD-10-CM | POA: Diagnosis not present

## 2022-03-17 DIAGNOSIS — K838 Other specified diseases of biliary tract: Secondary | ICD-10-CM | POA: Diagnosis not present

## 2022-03-17 DIAGNOSIS — E049 Nontoxic goiter, unspecified: Secondary | ICD-10-CM

## 2022-03-17 DIAGNOSIS — K9189 Other postprocedural complications and disorders of digestive system: Principal | ICD-10-CM | POA: Diagnosis present

## 2022-03-17 DIAGNOSIS — K259 Gastric ulcer, unspecified as acute or chronic, without hemorrhage or perforation: Secondary | ICD-10-CM | POA: Diagnosis not present

## 2022-03-17 DIAGNOSIS — K254 Chronic or unspecified gastric ulcer with hemorrhage: Secondary | ICD-10-CM | POA: Diagnosis present

## 2022-03-17 DIAGNOSIS — I10 Essential (primary) hypertension: Secondary | ICD-10-CM | POA: Diagnosis present

## 2022-03-17 DIAGNOSIS — J449 Chronic obstructive pulmonary disease, unspecified: Secondary | ICD-10-CM | POA: Diagnosis not present

## 2022-03-17 DIAGNOSIS — N184 Chronic kidney disease, stage 4 (severe): Secondary | ICD-10-CM | POA: Diagnosis present

## 2022-03-17 DIAGNOSIS — M549 Dorsalgia, unspecified: Secondary | ICD-10-CM | POA: Diagnosis not present

## 2022-03-17 DIAGNOSIS — Z9049 Acquired absence of other specified parts of digestive tract: Secondary | ICD-10-CM

## 2022-03-17 DIAGNOSIS — K921 Melena: Secondary | ICD-10-CM | POA: Diagnosis present

## 2022-03-17 DIAGNOSIS — Z79899 Other long term (current) drug therapy: Secondary | ICD-10-CM

## 2022-03-17 DIAGNOSIS — R112 Nausea with vomiting, unspecified: Secondary | ICD-10-CM

## 2022-03-17 DIAGNOSIS — D62 Acute posthemorrhagic anemia: Secondary | ICD-10-CM | POA: Diagnosis present

## 2022-03-17 DIAGNOSIS — R195 Other fecal abnormalities: Secondary | ICD-10-CM | POA: Diagnosis not present

## 2022-03-17 DIAGNOSIS — K920 Hematemesis: Secondary | ICD-10-CM | POA: Diagnosis not present

## 2022-03-17 DIAGNOSIS — N189 Chronic kidney disease, unspecified: Secondary | ICD-10-CM | POA: Diagnosis not present

## 2022-03-17 DIAGNOSIS — N179 Acute kidney failure, unspecified: Secondary | ICD-10-CM | POA: Diagnosis present

## 2022-03-17 DIAGNOSIS — F909 Attention-deficit hyperactivity disorder, unspecified type: Secondary | ICD-10-CM

## 2022-03-17 DIAGNOSIS — I129 Hypertensive chronic kidney disease with stage 1 through stage 4 chronic kidney disease, or unspecified chronic kidney disease: Secondary | ICD-10-CM | POA: Diagnosis present

## 2022-03-17 DIAGNOSIS — M109 Gout, unspecified: Secondary | ICD-10-CM | POA: Diagnosis present

## 2022-03-17 DIAGNOSIS — K922 Gastrointestinal hemorrhage, unspecified: Secondary | ICD-10-CM | POA: Diagnosis present

## 2022-03-17 DIAGNOSIS — R7989 Other specified abnormal findings of blood chemistry: Secondary | ICD-10-CM | POA: Diagnosis not present

## 2022-03-17 DIAGNOSIS — R933 Abnormal findings on diagnostic imaging of other parts of digestive tract: Secondary | ICD-10-CM | POA: Diagnosis not present

## 2022-03-17 LAB — CBC
HCT: 21.3 % — ABNORMAL LOW (ref 36.0–46.0)
Hemoglobin: 7 g/dL — ABNORMAL LOW (ref 12.0–15.0)
MCH: 28.1 pg (ref 26.0–34.0)
MCHC: 32.9 g/dL (ref 30.0–36.0)
MCV: 85.5 fL (ref 80.0–100.0)
Platelets: 356 10*3/uL (ref 150–400)
RBC: 2.49 MIL/uL — ABNORMAL LOW (ref 3.87–5.11)
RDW: 13.7 % (ref 11.5–15.5)
WBC: 8.7 10*3/uL (ref 4.0–10.5)
nRBC: 0 % (ref 0.0–0.2)

## 2022-03-17 LAB — COMPREHENSIVE METABOLIC PANEL
ALT: 30 U/L (ref 0–44)
AST: 21 U/L (ref 15–41)
Albumin: 2.1 g/dL — ABNORMAL LOW (ref 3.5–5.0)
Alkaline Phosphatase: 203 U/L — ABNORMAL HIGH (ref 38–126)
Anion gap: 12 (ref 5–15)
BUN: 104 mg/dL — ABNORMAL HIGH (ref 6–20)
CO2: 19 mmol/L — ABNORMAL LOW (ref 22–32)
Calcium: 7.8 mg/dL — ABNORMAL LOW (ref 8.9–10.3)
Chloride: 105 mmol/L (ref 98–111)
Creatinine, Ser: 2.69 mg/dL — ABNORMAL HIGH (ref 0.44–1.00)
GFR, Estimated: 21 mL/min — ABNORMAL LOW (ref >60)
Glucose, Bld: 148 mg/dL — ABNORMAL HIGH (ref 70–99)
Potassium: 4.5 mmol/L (ref 3.5–5.1)
Sodium: 136 mmol/L (ref 135–145)
Total Bilirubin: 0.6 mg/dL (ref 0.3–1.2)
Total Protein: 6.6 g/dL (ref 6.5–8.1)

## 2022-03-17 LAB — PREPARE RBC (CROSSMATCH)

## 2022-03-17 LAB — URINALYSIS, ROUTINE W REFLEX MICROSCOPIC
Bilirubin Urine: NEGATIVE
Glucose, UA: NEGATIVE mg/dL
Ketones, ur: NEGATIVE mg/dL
Leukocytes,Ua: NEGATIVE
Nitrite: NEGATIVE
Protein, ur: 30 mg/dL — AB
Specific Gravity, Urine: 1.01 (ref 1.005–1.030)
pH: 5 (ref 5.0–8.0)

## 2022-03-17 LAB — ABO/RH: ABO/RH(D): A POS

## 2022-03-17 LAB — LIPASE, BLOOD: Lipase: 65 U/L — ABNORMAL HIGH (ref 11–51)

## 2022-03-17 LAB — POC OCCULT BLOOD, ED: Fecal Occult Bld: POSITIVE — AB

## 2022-03-17 MED ORDER — SODIUM CHLORIDE 0.9 % IV SOLN: 10.0000 mL/h | Freq: Once | INTRAVENOUS | Status: DC

## 2022-03-17 MED ORDER — PANTOPRAZOLE SODIUM 40 MG IV SOLR
40.0000 mg | Freq: Two times a day (BID) | INTRAVENOUS | Status: DC
  Administered 2022-03-18 – 2022-03-21 (×7): 40 mg via INTRAVENOUS
  Filled 2022-03-17 (×8): qty 10

## 2022-03-17 MED ORDER — POLYETHYLENE GLYCOL 3350 17 G PO PACK: 17.0000 g | PACK | Freq: Every day | ORAL | Status: DC | PRN

## 2022-03-17 MED ORDER — HYDROMORPHONE HCL 1 MG/ML IJ SOLN
1.0000 mg | Freq: Once | INTRAMUSCULAR | Status: AC
  Administered 2022-03-17: 1 mg via INTRAVENOUS
  Filled 2022-03-17: qty 1

## 2022-03-17 MED ORDER — AMLODIPINE BESYLATE 10 MG PO TABS
10.0000 mg | ORAL_TABLET | Freq: Every day | ORAL | Status: DC
Start: 2022-03-18 — End: 2022-03-19
  Administered 2022-03-18: 10 mg via ORAL
  Filled 2022-03-17: qty 1

## 2022-03-17 MED ORDER — DOCUSATE SODIUM 100 MG PO CAPS
100.0000 mg | ORAL_CAPSULE | Freq: Two times a day (BID) | ORAL | Status: DC
  Administered 2022-03-22: 100 mg via ORAL
  Filled 2022-03-17 (×5): qty 1

## 2022-03-17 MED ORDER — SODIUM CHLORIDE 0.9 % IV SOLN: INTRAVENOUS | Status: DC

## 2022-03-17 MED ORDER — HYDROMORPHONE HCL 1 MG/ML IJ SOLN
1.0000 mg | INTRAMUSCULAR | Status: DC | PRN
  Administered 2022-03-17 – 2022-03-21 (×30): 1 mg via INTRAVENOUS
  Filled 2022-03-17 (×30): qty 1

## 2022-03-17 MED ORDER — ONDANSETRON HCL 4 MG/2ML IJ SOLN
4.0000 mg | Freq: Once | INTRAMUSCULAR | Status: AC
  Administered 2022-03-17: 4 mg via INTRAVENOUS
  Filled 2022-03-17: qty 2

## 2022-03-17 MED ORDER — CARVEDILOL 6.25 MG PO TABS
6.2500 mg | ORAL_TABLET | Freq: Two times a day (BID) | ORAL | Status: DC
  Administered 2022-03-18 – 2022-03-22 (×7): 6.25 mg via ORAL
  Filled 2022-03-17 (×8): qty 1

## 2022-03-17 MED ORDER — SODIUM CHLORIDE 0.9% IV SOLUTION: Freq: Once | INTRAVENOUS | Status: AC

## 2022-03-17 MED ORDER — ONDANSETRON HCL 4 MG/2ML IJ SOLN
4.0000 mg | Freq: Four times a day (QID) | INTRAMUSCULAR | Status: DC | PRN
  Administered 2022-03-19 – 2022-03-22 (×2): 4 mg via INTRAVENOUS
  Filled 2022-03-17 (×2): qty 2

## 2022-03-17 MED ORDER — ONDANSETRON HCL 4 MG PO TABS: 4.0000 mg | ORAL_TABLET | Freq: Four times a day (QID) | ORAL | Status: DC | PRN

## 2022-03-17 MED ORDER — PANTOPRAZOLE SODIUM 40 MG IV SOLR
40.0000 mg | Freq: Once | INTRAVENOUS | Status: AC
  Administered 2022-03-17: 40 mg via INTRAVENOUS
  Filled 2022-03-17: qty 10

## 2022-03-17 MED ORDER — SODIUM CHLORIDE 0.9 % IV BOLUS
1000.0000 mL | Freq: Once | INTRAVENOUS | Status: AC
  Administered 2022-03-17: 1000 mL via INTRAVENOUS

## 2022-03-17 NOTE — ED Triage Notes (Signed)
Pt BIB [facility] for N/V x 2 days, pt had gallbladder removed approx 3 weeks ago.

## 2022-03-17 NOTE — ED Provider Notes (Cosign Needed Addendum)
[facility] EMERGENCY DEPARTMENT Provider Note   CSN: [id number] Arrival date & time: 03/17/22  1550     History  Chief Complaint  Patient presents with   Abdominal Pain    Margaret Yoder is a 46 y.o. female with Hx of HTN, CKD stage IV, chronic back pain, anxiety, depression, gout, chronic tobacco use.  Presenting today with increased abdominal pain, and N/V/D. Vomiting and BMs with blood. Recent cholecystectomy 3 weeks ago.  Abdominal pain is diffuse.  Unable to keep down any food or fluids.  States "I have not felt right since my surgery".  Diarrhea described as exhaustive and without relief.  Known Hx of hemorrhoids, states the blood amount/type is typical when she has bleeding from her hemorrhoids.  Feels dehydrated.  Endorses some shortness of breath, states "I have COPD", which I cannot find record of anywhere.  Pt states this was a new diagnosis within the last few months and no therapies have been initiated.  Denies urinary symptoms, vaginal symptoms, neck pain/stiffness, back pain, fevers, chest pain.  The history is provided by the patient and medical records.  Abdominal Pain    Home Medications Prior to Admission medications   Medication Sig Start Date End Date Taking? Authorizing Provider  amLODipine (NORVASC) 10 MG tablet Take 1 tablet (10 mg total) by mouth daily. 02/22/22   Kathie Dike, MD  carvedilol (COREG) 6.25 MG tablet Take 1 tablet (6.25 mg total) by mouth 2 (two) times daily with a meal. 02/22/22   Kathie Dike, MD  oxyCODONE (ROXICODONE) 5 MG immediate release tablet Take 1 tablet (5 mg total) by mouth every 4 (four) hours as needed for severe pain. 02/23/22   Aviva Signs, MD      Allergies    Patient has no known allergies.    Review of Systems   Review of Systems  Gastrointestinal:  Positive for abdominal pain.    Physical Exam Updated Vital Signs BP 134/87   Pulse (!) 110   Temp 98.7 F (37.1 C) (Oral)   Resp (!) 24   Ht $R'5\' 3"'Yl$  (1.6 m)    Wt 76.7 kg   SpO2 99%   BMI 29.95 kg/m  Physical Exam Vitals and nursing note reviewed. Exam conducted with a chaperone present.  Constitutional:      General: She is not in acute distress.    Appearance: She is well-developed. She is ill-appearing. She is not toxic-appearing or diaphoretic.  HENT:     Head: Normocephalic and atraumatic.     Mouth/Throat:     Pharynx: Oropharynx is clear.  Eyes:     Conjunctiva/sclera: Conjunctivae normal.  Cardiovascular:     Rate and Rhythm: Regular rhythm. Tachycardia present.     Heart sounds: No murmur heard. Pulmonary:     Effort: Pulmonary effort is normal. No respiratory distress.     Breath sounds: Wheezing present.  Abdominal:     General: Abdomen is protuberant. A surgical scar is present.     Palpations: Abdomen is soft.     Tenderness: There is generalized abdominal tenderness.     Comments: Surgical scars appear to be healing well without evidence of infection.  Genitourinary:    Rectum: Guaiac result positive. Tenderness, external hemorrhoid and internal hemorrhoid present. No anal fissure. Normal anal tone.     Comments: Hemoccult positive.  No gross active rectal bleeding appreciated. Musculoskeletal:        General: No swelling.     Cervical back: Neck supple.  Skin:    General: Skin is warm and dry.     Capillary Refill: Capillary refill takes less than 2 seconds.     Coloration: Skin is not cyanotic.  Neurological:     Mental Status: She is alert.  Psychiatric:        Mood and Affect: Mood normal.     ED Results / Procedures / Treatments   Labs (all labs ordered are listed, but only abnormal results are displayed) Labs Reviewed  LIPASE, BLOOD - Abnormal; Notable for the following components:      Result Value   Lipase 65 (*)    All other components within normal limits  COMPREHENSIVE METABOLIC PANEL - Abnormal; Notable for the following components:   CO2 19 (*)    Glucose, Bld 148 (*)    BUN 104 (*)     Creatinine, Ser 2.69 (*)    Calcium 7.8 (*)    Albumin 2.1 (*)    Alkaline Phosphatase 203 (*)    GFR, Estimated 21 (*)    All other components within normal limits  CBC - Abnormal; Notable for the following components:   RBC 2.49 (*)    Hemoglobin 7.0 (*)    HCT 21.3 (*)    All other components within normal limits  URINALYSIS, ROUTINE W REFLEX MICROSCOPIC - Abnormal; Notable for the following components:   Hgb urine dipstick SMALL (*)    Protein, ur 30 (*)    Bacteria, UA RARE (*)    All other components within normal limits  POC OCCULT BLOOD, ED - Abnormal; Notable for the following components:   Fecal Occult Bld POSITIVE (*)    All other components within normal limits  PREPARE RBC (CROSSMATCH)  TYPE AND SCREEN  ABO/RH  PREPARE RBC (CROSSMATCH)    EKG None  Radiology CT ABDOMEN PELVIS WO CONTRAST  Result Date: 03/17/2022 CLINICAL DATA:  Abdominal pain, nausea/vomiting, recent cholecystectomy EXAM: CT ABDOMEN AND PELVIS WITHOUT CONTRAST TECHNIQUE: Multidetector CT imaging of the abdomen and pelvis was performed following the standard protocol without IV contrast. RADIATION DOSE REDUCTION: This exam was performed according to the departmental dose-optimization program which includes automated exposure control, adjustment of the mA and/or kV according to patient size and/or use of iterative reconstruction technique. COMPARISON:  Right upper quadrant ultrasound dated 02/21/2022 FINDINGS: Lower chest: Mild lingular scarring. Additional mild subpleural scarring/atelectasis in the lateral left lower lobe. Hepatobiliary: Large bilobed fluid collection likely arising from the gallbladder fossa and extending to the inferior/posterior aspect of the left hepatic lobe, measuring 11.4 x 25.3 x 12.2 cm (coronal image 48), suspicious for biloma in this patient status post recent cholecystectomy. Additional fluid collection/biloma anterior to the right hepatic lobe (series 2/image 30). Mass effect  on the liver, which is otherwise unremarkable. Status post cholecystectomy. No intrahepatic or extrahepatic ductal dilatation. Pancreas: Within normal limits. Spleen: Within normal limits. Adrenals/Urinary Tract: Adrenal glands are within normal limits. 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 No renal calculi or hydronephrosis. Bladder is within normal limits. Stomach/Bowel: Mass effect by the dominant fluid collection in the stomach. No evidence of bowel obstruction. Normal appendix (series 2/image 81). No colonic wall thickening or inflammatory changes. Vascular/Lymphatic: No evidence of abdominal aortic aneurysm. Atherosclerotic calcifications of the abdominal aorta and branch vessels. No suspicious abdominopelvic lymphadenopathy. Reproductive: Uterus of normal limits. Bilateral ovaries are within normal limits. Other: Otherwise, no abdominopelvic ascites. Musculoskeletal: Degenerative changes of the visualized thoracolumbar spine.  IMPRESSION: Large bilobed fluid collection likely arising from the gallbladder fossa and extending to the inferior/posterior aspect of the left hepatic lobe, measuring up to 25.3 cm, suspicious for biloma in this patient status post recent cholecystectomy. Additional fluid collection/biloma anterior to the right hepatic lobe. Additional ancillary findings as above. These results were called by telephone at the time of interpretation on 03/17/2022 at 6:35 pm to provider [facility] , who verbally acknowledged these results. Electronically Signed   By: Julian Hy M.D.   On: 03/17/2022 18:40   DG Chest Port 1 View  Result Date: 03/17/2022 CLINICAL DATA:  Shortness of breath EXAM: PORTABLE CHEST 1 VIEW COMPARISON:  02/19/2020 FINDINGS: Lungs are clear.  No pleural effusion or pneumothorax. The heart is normal in size. Metallic device overlying the left neck (incompletely imaged) is likely external to the patient.  IMPRESSION: No evidence of acute cardiopulmonary disease. Electronically Signed   By: Julian Hy M.D.   On: 03/17/2022 17:49    Procedures Procedures    Medications Ordered in ED Medications  0.9 %  sodium chloride infusion (has no administration in time range)  0.9 %  sodium chloride infusion (Manually program via Guardrails IV Fluids) (has no administration in time range)  sodium chloride 0.9 % bolus 1,000 mL (1,000 mLs Intravenous New Bag/Given 03/17/22 1709)  ondansetron (ZOFRAN) injection 4 mg (4 mg Intravenous Given 03/17/22 1710)  HYDROmorphone (DILAUDID) injection 1 mg (1 mg Intravenous Given 03/17/22 1713)  pantoprazole (PROTONIX) injection 40 mg (40 mg Intravenous Given 03/17/22 1758)    ED Course/ Medical Decision Making/ A&P                           Medical Decision Making Amount and/or Complexity of Data Reviewed Labs: ordered. Radiology: ordered.  Risk Prescription drug management. Decision regarding hospitalization.   46 y.o. female presents to the ED for concern of Abdominal Pain     This involves an extensive number of treatment options, and is a complaint that carries with it a high risk of complications and morbidity.     Past Medical History / Co-morbidities / Social History: Hx of HTN, CKD stage IV, chronic back pain, anxiety, depression, gout, chronic tobacco use Social Determinants of Health include: Chronic tobacco use, for cessation counseling was provided  Additional History:  Internal and external records from outside source obtained and reviewed including: Recent surgical records of cholecystectomy from 02/22/2022  Lab Tests: I ordered, and personally interpreted labs.  The pertinent results include:   CBC: Hgb 7.0, HCT 21.3, RBC 2.49 CMP/BMP: Glucose 148, creatinine 2.69, BUN 104, calcium 7.8, albumin 2.1, alk phos 203, GFR 21, CO2 19 Lipase: 65 UA: protein 30  Imaging Studies: I ordered imaging studies including CXR, and CT  abdomen/pelvis .   I independently visualized and interpreted imaging which showed large bilobed fluid collection likely arising from the gallbladder fossa and extending to the inferior/posterior aspect of the left hepatic lobe, measuring up to 25.3 cm, suspicious for biloma in this patient status post recent cholecystectomy. Additional fluid collection/biloma anterior to the right hepatic lobe.  CXR negative for acute cardiopulmonary pathology. I agree with the radiologist interpretation.  Cardiac Monitoring: The patient was maintained on a cardiac monitor.  I personally viewed and interpreted the cardiac monitored which showed an underlying rhythm of: sinus tachycardia  ED Course / Critical Interventions: Pt ill-appearing on exam.  Presenting today with increasing abdominal pain, N/V/D over the last  2-3 days.  Recently had cholecystectomy 3 weeks ago.  Abdominal pain and discomfort with nausea since the procedure.  Endorses bloody bowel movements and hematemesis.  Patient further describes the hematemesis as pink, also admitting she has been consuming pink electrolyte drinks and Pepto-Bismol.  No hematemesis appreciated since arrival, and description does not sound like true hematemesis.  Positive Hemoccult with Hgb of 7.0.  Known Hx of hemorrhoids.  Risks and benefits discussed of transfusion, consent obtained, transfusion order started.  2 large-bore IV placed.  Significantly poor creatinine and BUN as described above, plan to proceed with noncontrast CT imaging of abdomen/pelvis for further evaluation.  Fluid bolus, Zofran, and pain management provided. Upon reevaluation, patient status is unchanged.  Hemodynamically stable.  Tachycardic and tachypneic, however without fever and does not appear to be in respiratory distress.  Tachycardia may be related to volume loss/dehydration.  Tachypnea likely related to pain and CT findings.  Does not appear septic at this time.  Informed of CT results by  radiologist, with significant concern for 25 cm fluid collection in the area of the gallbladder used to be.  Suspicious for biloma.  Recommended discussing with general surgery, consultation placed. I consulted with Dr. Arnoldo Morale of general surgery, we discussed the patient's case in detail.  He recommended admission for hospitalist to hospitalist transfer to [facility], and prompt drainage of fluid by interventional radiology.  Consultation for hospitalist placed. I consulted with Dr. Nehemiah Settle of hospitalist team.  We discussed the patient at length, hospitalist team ready to accept the patient and coordinate transfer. I have reviewed the patients home medicines and have made adjustments as needed.  Disposition: Admission.    I discussed this case with my attending, Dr. Roderic Palau, who agreed with the proposed treatment course and cosigned this note including patient's presenting symptoms, physical exam, and planned diagnostics and interventions.  Attending physician stated agreement with plan or made changes to plan which were implemented.     This chart was dictated using voice recognition software.  Despite best efforts to proofread, errors can occur which can change the documentation meaning.         Final Clinical Impression(s) / ED Diagnoses Final diagnoses:  Biloma following surgery, initial encounter  Generalized abdominal pain  Nausea vomiting and diarrhea  Acute anemia    Rx / DC Orders ED Discharge Orders     None         Prince Rome, PA-C 57/47/34 [id number]    Prince Rome, PA-C 96/43/83 [id number]    Milton Ferguson, MD 03/19/22 [phone]

## 2022-03-17 NOTE — H&P (Signed)
History and Physical    Patient: Margaret Yoder TSV:[id number] DOB: 03-Dec-1975 DOA: 03/17/2022 DOS: the patient was seen and examined on 03/17/2022 PCP: Candi Leash, PA-C  Patient coming from: Home  Chief Complaint:  Chief Complaint  Patient presents with   Abdominal Pain   HPI: Margaret Yoder is a 46 y.o. female with medical history significant of HTN, h/o gout. She had a laparoscopic cholecystectomy on 7/5 due to cholecystitis and cholelithiasis.  The patient experienced no interim operative complications and was discharged same day as the procedure.  Since that time, the patient has continued to have right upper quadrant abdominal pain that has been worsening, specifically over the past week.  She has been having nausea and vomiting and has been intolerant of oral foods, but has been able to keep down oral liquids.  The patient began experiencing melenic stools over the past week.  She denies fevers, chills, chest pain, shortness of breath.  She has been lethargic and lightheaded and dizzy when she is walking around.  She presented to the emergency department for evaluation.  Review of Systems: As mentioned in the history of present illness. All other systems reviewed and are negative. Past Medical History:  Diagnosis Date   Anxiety    Chronic back pain    Depression    Gout    Hypertension    Past Surgical History:  Procedure Laterality Date   CHOLECYSTECTOMY N/A 02/22/2022   Procedure: LAPAROSCOPIC CHOLECYSTECTOMY;  Surgeon: Aviva Signs, MD;  Location: [facility];  Service: General;  Laterality: N/A;   VIDEO BRONCHOSCOPY N/A 11/11/2014   Procedure: VIDEO BRONCHOSCOPY;  Surgeon: Ivin Poot, MD;  Location: [facility];  Service: Thoracic;  Laterality: N/A;   Social History:  reports that she has been smoking cigarettes. She has been smoking an average of 1 pack per day. She has never used smokeless tobacco. She reports that she does not drink alcohol and does not use  drugs.  No Known Allergies  Family History  Family history unknown: Yes    Prior to Admission medications   Medication Sig Start Date End Date Taking? Authorizing Provider  amLODipine (NORVASC) 10 MG tablet Take 1 tablet (10 mg total) by mouth daily. 02/22/22  Yes Kathie Dike, MD  carvedilol (COREG) 6.25 MG tablet Take 1 tablet (6.25 mg total) by mouth 2 (two) times daily with a meal. 02/22/22  Yes Memon, Jolaine Artist, MD  oxyCODONE (ROXICODONE) 5 MG immediate release tablet Take 1 tablet (5 mg total) by mouth every 4 (four) hours as needed for severe pain. 02/23/22  Yes Aviva Signs, MD    Physical Exam: Vitals:   03/17/22 1717 03/17/22 1730 03/17/22 1745 03/17/22 1830  BP: 138/89 134/87    Pulse: (!) 118   (!) 110  Resp:   (!) 24 (!) 24  Temp:      TempSrc:      SpO2: 100%   99%  Weight:      Height:       General: Middle-age female. Awake and alert and oriented x3. No acute cardiopulmonary distress.  HEENT: Normocephalic atraumatic.  Right and left ears normal in appearance.  Pupils equal, round, reactive to light. Extraocular muscles are intact. Sclerae anicteric and noninjected.  Moist mucosal membranes. No mucosal lesions.  Neck: Neck supple without lymphadenopathy. No carotid bruits. No masses palpated.  Cardiovascular: Regular rate with normal S1-S2 sounds. No murmurs, rubs, gallops auscultated. No JVD.  Respiratory: Good respiratory effort with no wheezes, rales, rhonchi.  Lungs clear to auscultation bilaterally.  No accessory muscle use. Abdomen: Firm in the right upper, right lower and epigastric areas with tenderness to palpation.  There is some guarding, but no rebound tenderness.  She does have active bowel sounds. No masses or hepatosplenomegaly  Skin: No rashes, lesions, or ulcerations.  Dry, warm to touch. 2+ dorsalis pedis and radial pulses. Musculoskeletal: No calf or leg pain. All major joints not erythematous nontender.  No upper or lower joint deformation.  Good ROM.   No contractures  Psychiatric: Intact judgment and insight. Pleasant and cooperative. Neurologic: No focal neurological deficits. Strength is 5/5 and symmetric in upper and lower extremities.  Cranial nerves II through XII are grossly intact.  Data Reviewed: Results for orders placed or performed during the hospital encounter of 03/17/22 (from the past 24 hour(s))  Urinalysis, Routine w reflex microscopic Urine, Clean Catch     Status: Abnormal   Collection Time: 03/17/22  4:04 PM  Result Value Ref Range   Color, Urine YELLOW YELLOW   APPearance CLEAR CLEAR   Specific Gravity, Urine 1.010 1.005 - 1.030   pH 5.0 5.0 - 8.0   Glucose, UA NEGATIVE NEGATIVE mg/dL   Hgb urine dipstick SMALL (A) NEGATIVE   Bilirubin Urine NEGATIVE NEGATIVE   Ketones, ur NEGATIVE NEGATIVE mg/dL   Protein, ur 30 (A) NEGATIVE mg/dL   Nitrite NEGATIVE NEGATIVE   Leukocytes,Ua NEGATIVE NEGATIVE   WBC, UA 0-5 0 - 5 WBC/hpf   Bacteria, UA RARE (A) NONE SEEN   Squamous Epithelial / LPF 0-5 0 - 5   Mucus PRESENT   Lipase, blood     Status: Abnormal   Collection Time: 03/17/22  4:11 PM  Result Value Ref Range   Lipase 65 (H) 11 - 51 U/L  Comprehensive metabolic panel     Status: Abnormal   Collection Time: 03/17/22  4:11 PM  Result Value Ref Range   Sodium 136 135 - 145 mmol/L   Potassium 4.5 3.5 - 5.1 mmol/L   Chloride 105 98 - 111 mmol/L   CO2 19 (L) 22 - 32 mmol/L   Glucose, Bld 148 (H) 70 - 99 mg/dL   BUN 104 (H) 6 - 20 mg/dL   Creatinine, Ser 2.69 (H) 0.44 - 1.00 mg/dL   Calcium 7.8 (L) 8.9 - 10.3 mg/dL   Total Protein 6.6 6.5 - 8.1 g/dL   Albumin 2.1 (L) 3.5 - 5.0 g/dL   AST 21 15 - 41 U/L   ALT 30 0 - 44 U/L   Alkaline Phosphatase 203 (H) 38 - 126 U/L   Total Bilirubin 0.6 0.3 - 1.2 mg/dL   GFR, Estimated 21 (L) >60 mL/min   Anion gap 12 5 - 15  CBC     Status: Abnormal   Collection Time: 03/17/22  4:11 PM  Result Value Ref Range   WBC 8.7 4.0 - 10.5 K/uL   RBC 2.49 (L) 3.87 - 5.11 MIL/uL    Hemoglobin 7.0 (L) 12.0 - 15.0 g/dL   HCT 21.3 (L) 36.0 - 46.0 %   MCV 85.5 80.0 - 100.0 fL   MCH 28.1 26.0 - 34.0 pg   MCHC 32.9 30.0 - 36.0 g/dL   RDW 13.7 11.5 - 15.5 %   Platelets 356 150 - 400 K/uL   nRBC 0.0 0.0 - 0.2 %  ABO/Rh     Status: None   Collection Time: 03/17/22  4:11 PM  Result Value Ref Range   ABO/RH(D)  A POS Performed at [facility], [address], [place], [place] [address]   Prepare RBC (crossmatch)     Status: None   Collection Time: 03/17/22  6:30 PM  Result Value Ref Range   Order Confirmation      ORDER PROCESSED BY BLOOD BANK Performed at [facility], [address] [place], [place] [address]   Type and screen     Status: None (Preliminary result)   Collection Time: 03/17/22  6:30 PM  Result Value Ref Range   ABO/RH(D) A POS    Antibody Screen NEG    Sample Expiration      03/20/2022,2359 Performed at [facility], [address], [place], [place] [address]    Unit Number [id number]    Blood Component Type RED CELLS,LR    Unit division 00    Status of Unit ALLOCATED    Transfusion Status OK TO TRANSFUSE    Crossmatch Result Compatible   POC occult blood, ED     Status: Abnormal   Collection Time: 03/17/22  7:00 PM  Result Value Ref Range   Fecal Occult Bld POSITIVE (A) NEGATIVE   CT ABDOMEN PELVIS WO CONTRAST  Result Date: 03/17/2022 CLINICAL DATA:  Abdominal pain, nausea/vomiting, recent cholecystectomy EXAM: CT ABDOMEN AND PELVIS WITHOUT CONTRAST TECHNIQUE: Multidetector CT imaging of the abdomen and pelvis was performed following the standard protocol without IV contrast. RADIATION DOSE REDUCTION: This exam was performed according to the departmental dose-optimization program which includes automated exposure control, adjustment of the mA and/or kV according to patient size and/or use of iterative reconstruction technique. COMPARISON:  Right upper quadrant ultrasound dated 02/21/2022 FINDINGS: Lower chest: Mild lingular scarring.  Additional mild subpleural scarring/atelectasis in the lateral left lower lobe. Hepatobiliary: Large bilobed fluid collection likely arising from the gallbladder fossa and extending to the inferior/posterior aspect of the left hepatic lobe, measuring 11.4 x 25.3 x 12.2 cm (coronal image 48), suspicious for biloma in this patient status post recent cholecystectomy. Additional fluid collection/biloma anterior to the right hepatic lobe (series 2/image 30). Mass effect on the liver, which is otherwise unremarkable. Status post cholecystectomy. No intrahepatic or extrahepatic ductal dilatation. Pancreas: Within normal limits. Spleen: Within normal limits. Adrenals/Urinary Tract: Adrenal glands are within normal limits. 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 No renal calculi or hydronephrosis. Bladder is within normal limits. Stomach/Bowel: Mass effect by the dominant fluid collection in the stomach. No evidence of bowel obstruction. Normal appendix (series 2/image 81). No colonic wall thickening or inflammatory changes. Vascular/Lymphatic: No evidence of abdominal aortic aneurysm. Atherosclerotic calcifications of the abdominal aorta and branch vessels. No suspicious abdominopelvic lymphadenopathy. Reproductive: Uterus of normal limits. Bilateral ovaries are within normal limits. Other: Otherwise, no abdominopelvic ascites. Musculoskeletal: Degenerative changes of the visualized thoracolumbar spine. IMPRESSION: Large bilobed fluid collection likely arising from the gallbladder fossa and extending to the inferior/posterior aspect of the left hepatic lobe, measuring up to 25.3 cm, suspicious for biloma in this patient status post recent cholecystectomy. Additional fluid collection/biloma anterior to the right hepatic lobe. Additional ancillary findings as above. These results were called by telephone at the time of interpretation on 03/17/2022 at 6:35 pm to provider  [facility] , who verbally acknowledged these results. Electronically Signed   By: Julian Hy M.D.   On: 03/17/2022 18:40   DG Chest Port 1 View  Result Date: 03/17/2022 CLINICAL DATA:  Shortness of breath EXAM: PORTABLE CHEST 1 VIEW COMPARISON:  02/19/2020 FINDINGS: Lungs are clear.  No pleural effusion or pneumothorax. The heart is normal in size. Metallic device overlying the left neck (incompletely imaged) is likely external to the patient. IMPRESSION: No evidence of acute cardiopulmonary disease. Electronically Signed   By: Julian Hy M.D.   On: 03/17/2022 17:49     Assessment and Plan: No notes have been filed under this hospital service. Service: Hospitalist  Principal Problem:   Biloma following surgery, initial encounter Active Problems:   Essential hypertension   Acute kidney injury superimposed on chronic kidney disease ([place])   GI bleed   Acute blood loss anemia  Acute fluid collection suspicious of biloma I discussed patient with interventional radiology. We will admit the patient to [facility] for IR procedure tomorrow. Patient on clear liquids and we will keep n.p.o. after midnight for procedure, particularly as the patient is not tolerating oral foods at this point. We will keep the patient comfortable with IV pain medicine I have also consulted GI in the case that this is a biloma -patient will likely need a HIDA scan to see if this is an active leak, in which case the patient will need further intervention. Acute kidney injury superimposed on chronic kidney disease Likely secondary to acute anemia and possible dehydration.  We will fluid replace with both packed red blood cells Recheck creatinine and CBC in the morning Acute blood loss anemia Possibly secondary to GI bleed.  The other consideration is that the perihepatic fluid collection is a hematoma. GI bleed With melenic stools, likely upper GI bleed.  GI consulted Start  Protonix Hypertension Continue home medicine   Advance Care Planning:   Code Status: Prior Full code  Consults: GI, IR  Family Communication: none  Severity of Illness: The appropriate patient status for this patient is INPATIENT. Inpatient status is judged to be reasonable and necessary in order to provide the required intensity of service to ensure the patient's safety. The patient's presenting symptoms, physical exam findings, and initial radiographic and laboratory data in the context of their chronic comorbidities is felt to place them at high risk for further clinical deterioration. Furthermore, it is not anticipated that the patient will be medically stable for discharge from the hospital within 2 midnights of admission.   * I certify that at the point of admission it is my clinical judgment that the patient will require inpatient hospital care spanning beyond 2 midnights from the point of admission due to high intensity of service, high risk for further deterioration and high frequency of surveillance required.*  Author: Truett Mainland, DO 03/17/2022 7:29 PM  For on call review [email or web address].

## 2022-03-17 NOTE — Progress Notes (Signed)
Pt arrived to room 6N23 via stretcher [facility] from [place].  1st unit of blood completed upon arrival.  Pt noted to have some wheezing bilaterally.  She states "a little short of breath".  She also has abdominal pain mid to right center rated 8/10 - PRN Dilaudid given.  Dr. Hal Hope made aware of need for order for repeat T+S and further transfusion orders and also regarding her wheezing.  Pt is a yellow MEWS due to resp of 32 at rest.    Will check stat labs and chest xray.    03/17/22 2238 03/17/22 2243  Vitals  Temp 98.2 F (36.8 C)  --   Temp Source Oral  --   BP 132/76  --   MAP (mmHg) 93  --   BP Location Right Arm  --   BP Method Automatic  --   Patient Position (if appropriate) Lying  --   Pulse Rate 100  --   Pulse Rate Source Monitor  --   Resp (!) 32  --   MEWS COLOR  MEWS Score Color Yellow  --   Oxygen Therapy  SpO2 99 %  --   O2 Device Room Air  --   Pain Assessment  Pain Scale  --  0-10  Pain Score  --  8  Pain Type  --  Acute pain  Pain Location  --  Abdomen  Pain Orientation  --  Right;Mid  Pain Radiating Towards  --  back  Pain Descriptors / Indicators  --  Burning;Sharp;Shooting  Pain Intervention(s)  --  Medication (See eMAR);Emotional support  POSS Scale ([place] Opioid Sedation Scale)  POSS *See Group Information*  --  1-Acceptable,Awake and alert   Ayesha Mohair BSN RN [facility] 03/17/2022, 11:09 PM

## 2022-03-17 NOTE — ED Notes (Signed)
Pt states hasnt been able to eat right since gallbladder out 2-3 weeks ago. N/V x 2 day. Received liter bolus and zofran in route. Pt states the zofran has helped some. Nad. Mm wet. Abd soft and nondistended, incisions has healed well. Tender to palpation to lower abd.

## 2022-03-17 NOTE — ED Notes (Signed)
Attempted report 

## 2022-03-17 NOTE — ED Notes (Addendum)
Pt states she has had diarrhea as well over 10 in last 24 hrs and has been black but denies blood with approx 5 episodes of emesis in last 24 hours.

## 2022-03-17 NOTE — ED Notes (Signed)
Report given to [facility], ETA 15 minutes

## 2022-03-18 ENCOUNTER — Encounter (HOSPITAL_COMMUNITY): Payer: Self-pay | Admitting: Family Medicine

## 2022-03-18 ENCOUNTER — Inpatient Hospital Stay (HOSPITAL_COMMUNITY): Payer: 59

## 2022-03-18 DIAGNOSIS — N189 Chronic kidney disease, unspecified: Secondary | ICD-10-CM

## 2022-03-18 DIAGNOSIS — R195 Other fecal abnormalities: Secondary | ICD-10-CM

## 2022-03-18 DIAGNOSIS — R799 Abnormal finding of blood chemistry, unspecified: Secondary | ICD-10-CM

## 2022-03-18 DIAGNOSIS — T8189XA Other complications of procedures, not elsewhere classified, initial encounter: Secondary | ICD-10-CM | POA: Diagnosis not present

## 2022-03-18 DIAGNOSIS — F419 Anxiety disorder, unspecified: Secondary | ICD-10-CM

## 2022-03-18 DIAGNOSIS — F909 Attention-deficit hyperactivity disorder, unspecified type: Secondary | ICD-10-CM

## 2022-03-18 DIAGNOSIS — D62 Acute posthemorrhagic anemia: Secondary | ICD-10-CM | POA: Diagnosis not present

## 2022-03-18 DIAGNOSIS — I10 Essential (primary) hypertension: Secondary | ICD-10-CM

## 2022-03-18 DIAGNOSIS — N184 Chronic kidney disease, stage 4 (severe): Secondary | ICD-10-CM

## 2022-03-18 DIAGNOSIS — K920 Hematemesis: Secondary | ICD-10-CM

## 2022-03-18 DIAGNOSIS — N179 Acute kidney failure, unspecified: Secondary | ICD-10-CM

## 2022-03-18 DIAGNOSIS — G894 Chronic pain syndrome: Secondary | ICD-10-CM

## 2022-03-18 HISTORY — PX: IR US GUIDE BX ASP/DRAIN: IMG2392

## 2022-03-18 LAB — HEPATIC FUNCTION PANEL
ALT: 25 U/L (ref 0–44)
AST: 21 U/L (ref 15–41)
Albumin: 1.9 g/dL — ABNORMAL LOW (ref 3.5–5.0)
Alkaline Phosphatase: 146 U/L — ABNORMAL HIGH (ref 38–126)
Bilirubin, Direct: 0.2 mg/dL (ref 0.0–0.2)
Indirect Bilirubin: 0.9 mg/dL (ref 0.3–0.9)
Total Bilirubin: 1.1 mg/dL (ref 0.3–1.2)
Total Protein: 6 g/dL — ABNORMAL LOW (ref 6.5–8.1)

## 2022-03-18 LAB — BASIC METABOLIC PANEL
Anion gap: 9 (ref 5–15)
BUN: 97 mg/dL — ABNORMAL HIGH (ref 6–20)
CO2: 19 mmol/L — ABNORMAL LOW (ref 22–32)
Calcium: 8.3 mg/dL — ABNORMAL LOW (ref 8.9–10.3)
Chloride: 111 mmol/L (ref 98–111)
Creatinine, Ser: 2.56 mg/dL — ABNORMAL HIGH (ref 0.44–1.00)
GFR, Estimated: 23 mL/min — ABNORMAL LOW (ref >60)
Glucose, Bld: 108 mg/dL — ABNORMAL HIGH (ref 70–99)
Potassium: 4.5 mmol/L (ref 3.5–5.1)
Sodium: 139 mmol/L (ref 135–145)

## 2022-03-18 LAB — CBC
HCT: 23.9 % — ABNORMAL LOW (ref 36.0–46.0)
Hemoglobin: 7.8 g/dL — ABNORMAL LOW (ref 12.0–15.0)
MCH: 28.4 pg (ref 26.0–34.0)
MCHC: 32.6 g/dL (ref 30.0–36.0)
MCV: 86.9 fL (ref 80.0–100.0)
Platelets: 305 10*3/uL (ref 150–400)
RBC: 2.75 MIL/uL — ABNORMAL LOW (ref 3.87–5.11)
RDW: 14.5 % (ref 11.5–15.5)
WBC: 8.1 10*3/uL (ref 4.0–10.5)
nRBC: 0 % (ref 0.0–0.2)

## 2022-03-18 LAB — PROTIME-INR
INR: 1.3 — ABNORMAL HIGH (ref 0.8–1.2)
Prothrombin Time: 16.3 seconds — ABNORMAL HIGH (ref 11.4–15.2)

## 2022-03-18 LAB — HEMOGLOBIN AND HEMATOCRIT, BLOOD
HCT: 31.9 % — ABNORMAL LOW (ref 36.0–46.0)
Hemoglobin: 9.7 g/dL — ABNORMAL LOW (ref 12.0–15.0)

## 2022-03-18 LAB — PREPARE RBC (CROSSMATCH)

## 2022-03-18 MED ORDER — HYDROMORPHONE HCL 1 MG/ML IJ SOLN
INTRAMUSCULAR | Status: AC
  Filled 2022-03-18: qty 1

## 2022-03-18 MED ORDER — FENTANYL CITRATE (PF) 100 MCG/2ML IJ SOLN
INTRAMUSCULAR | Status: AC
  Filled 2022-03-18: qty 2

## 2022-03-18 MED ORDER — LIDOCAINE HCL (PF) 1 % IJ SOLN
INTRAMUSCULAR | Status: AC | PRN
  Administered 2022-03-18: 10 mL

## 2022-03-18 MED ORDER — SODIUM CHLORIDE 0.9% IV SOLUTION
Freq: Once | INTRAVENOUS | Status: AC
Start: 2022-03-18 — End: 2022-03-18

## 2022-03-18 MED ORDER — IPRATROPIUM-ALBUTEROL 0.5-2.5 (3) MG/3ML IN SOLN: 3.0000 mL | RESPIRATORY_TRACT | Status: DC | PRN

## 2022-03-18 MED ORDER — FENTANYL CITRATE (PF) 100 MCG/2ML IJ SOLN
INTRAMUSCULAR | Status: AC | PRN
  Administered 2022-03-18: 50 ug via INTRAVENOUS
  Administered 2022-03-18 (×2): 25 ug via INTRAVENOUS

## 2022-03-18 MED ORDER — CEFAZOLIN SODIUM-DEXTROSE 2-4 GM/100ML-% IV SOLN
2.0000 g | Freq: Once | INTRAVENOUS | Status: AC
Start: 2022-03-18 — End: 2022-03-18

## 2022-03-18 MED ORDER — MIDAZOLAM HCL 2 MG/2ML IJ SOLN
INTRAMUSCULAR | Status: AC | PRN
  Administered 2022-03-18 (×2): .5 mg via INTRAVENOUS
  Administered 2022-03-18: 1 mg via INTRAVENOUS

## 2022-03-18 MED ORDER — MIDAZOLAM HCL 2 MG/2ML IJ SOLN
INTRAMUSCULAR | Status: AC
  Filled 2022-03-18: qty 2

## 2022-03-18 MED ORDER — CEFAZOLIN SODIUM-DEXTROSE 2-4 GM/100ML-% IV SOLN
INTRAVENOUS | Status: AC
  Administered 2022-03-18: 2 g via INTRAVENOUS
  Filled 2022-03-18: qty 100

## 2022-03-18 MED ORDER — HYDROMORPHONE HCL 1 MG/ML IJ SOLN
INTRAMUSCULAR | Status: AC | PRN
  Administered 2022-03-18: 1 mg via INTRAVENOUS

## 2022-03-18 MED ORDER — SODIUM CHLORIDE 0.9% FLUSH
5.0000 mL | Freq: Three times a day (TID) | INTRAVENOUS | Status: DC
Start: 2022-03-18 — End: 2022-03-22
  Administered 2022-03-18 – 2022-03-22 (×9): 5 mL

## 2022-03-18 MED ORDER — LIDOCAINE HCL 1 % IJ SOLN
INTRAMUSCULAR | Status: AC
  Filled 2022-03-18: qty 20

## 2022-03-18 NOTE — Assessment & Plan Note (Signed)
Baseline Cr 1.9-2.1, presented with Cr 2.7. - Continue IV fluids - Avoid nephrotoxins

## 2022-03-18 NOTE — Assessment & Plan Note (Signed)
Clinically reported melena. - Consult GI - Continue PPI

## 2022-03-18 NOTE — Hospital Course (Signed)
Mrs. Pavlovic is a 46 y.o. F with chronic pain, anxiety and ADHD on daily Vicodin, Klonopin and Adderall, HTN, smoking, CKD IV baseline Cr 1.9-2.1 and recent cholecystectomy who presented with recurrent RUQ pain.  Had recent uncomplicated cholecystectomy.  After discharge, developed progressively worsening RUQ pain, then vomiting, then "dark stools" then dizziness so she returned to the ER.   7/28: Admitted 729: Perc drain placed 7/30 EGD showed gastritis 7/31 HIDA planned

## 2022-03-18 NOTE — Assessment & Plan Note (Signed)
Patient admitted and had percutaneous drain placed 7/29, fluid appeared to be bile, culture no growth.    Antibiotics held and she had no fever or worsening symptoms.  HIDA obtained, confirmed bile leak into gallbladder fossa.  ERCP on 8/1, confirmed leak, placed stent. - Consult GI, appreciate cares

## 2022-03-18 NOTE — Assessment & Plan Note (Addendum)
No longer on Adderall.

## 2022-03-18 NOTE — Assessment & Plan Note (Signed)
No longer on daily benzodiazepines.

## 2022-03-18 NOTE — Plan of Care (Signed)
  Problem: Education: Goal: Knowledge of General Education information will improve Description Including pain rating scale, medication(s)/side effects and non-pharmacologic comfort measures Outcome: Progressing   Problem: Health Behavior/Discharge Planning: Goal: Ability to manage health-related needs will improve Outcome: Progressing   

## 2022-03-18 NOTE — Progress Notes (Signed)
  Progress Note   Patient: Margaret Yoder [id number] DOB: Aug 10, 1976 DOA: 03/17/2022     1 DOS: the patient was seen and examined on 03/18/2022        Brief hospital course: Mrs. Wenk is a 46 y.o. F with chronic pain, anxiety and ADHD on daily Vicodin, Klonopin and Adderall, HTN, smoking, CKD IV baseline Cr 1.9-2.1 and recent cholecystectomy who presented with recurrent RUQ pain.  Had recent uncomplicated cholecystectomy.  After discharge, developed progressively worsening RUQ pain, then vomiting, then "dark stools" then dizziness so she returned to the ER.       Assessment and Plan: * Biloma following surgery, initial encounter - NPO - IVF - HIDA scan - Consult IR - Consult Gen Surg   Acute kidney injury superimposed on chronic kidney disease ([place]) Baseline Cr 1.9-2.1, presented with Cr 2.7. - Continue IV fluids - Avoid nephrotoxins  Melena Clinically reported melena. - Consult GI - Continue PPI  Anxiety - Hold Benzodiazepines for now  ADHD - Hold Adderall  Chronic pain syndrome - Hold home Vicodin - PRN Dilaudid for pain  Acute blood loss anemia Transfused 2 units - Trend Hgb  CKD (chronic kidney disease), stage IV ([facility]) Baseline in last 6 months appaers to be in the 1.9-2.1 range.  CKD III ruled out, this would be CKD IV.  Essential hypertension BP controlled - Continue amlodipine, carvedilol          Subjective: Patient had 0.8 L fluid drained from the belly this afternoon, she feels less pressure, still a lot of discomfort in the belly though.  No fever, confusion, vomiting, respiratory distress.     Physical Exam: Vitals:   03/18/22 1240 03/18/22 1254 03/18/22 1335 03/18/22 1407  BP: 109/69 102/62 (!) 107/58 (!) 101/54  Pulse: 92  87 89  Resp: (!) 22 17 17 17   Temp:  98.1 F (36.7 C) 97.9 F (36.6 C) 98 F (36.7 C)  TempSrc:  Oral Oral Oral  SpO2: 99%  98% 100%  Weight:      Height:       Adult female, lying in bed,  appears uncomfortable, eyes closed Tachycardic, no murmurs, no peripheral edema Respiratory rate normal, lungs clear without rales or wheezes There is a percutaneous drain in the left upper abdomen, this is draining a little bit of grayish cloudy fluid, the abdomen is diffusely tender, she has voluntary guarding Attention normal, affect blunted by pain, judgment and insight appear normal, speech fluent, face symmetric, moves all extremities with normal strength and coordination  Data Reviewed: Basic metabolic panel shows creatinine down to 2.56 Hemogram shows hemoglobin up to 7.8 after transfusion Fecal occult blood positive CT of the abdomen pelvis from prior to the drain placement shows 24 cm fluid collection adjacent to the gallbladder fossa      Disposition: Status is: Inpatient         Author: Edwin Dada, MD 03/18/2022 4:54 PM  For on call review [email or web address].

## 2022-03-18 NOTE — Assessment & Plan Note (Signed)
BP soft - Continue carvedilol - Hold amlodipine

## 2022-03-18 NOTE — Procedures (Signed)
Interventional Radiology Procedure Note  Procedure: US guided abdominal drain  Indication: Abdominal fluid collection  Findings: Please refer to procedural dictation for full description.  Complications: None  EBL: < 10 mL  Miachel Roux, MD [phone]

## 2022-03-18 NOTE — Consult Note (Signed)
Referring Provider: No ref. provider found Primary Care Physician:  Candi Leash, PA-C Primary Gastroenterologist: Althia Forts  Reason for Consultation:  Biloma, bile leak  HPI: Margaret Yoder is a 46 y.o. female with medical history significant of HTN, h/o gout, also looks like some chronic kidney disease and COPD. She had a laparoscopic cholecystectomy on 7/5 due to cholecystitis and cholelithiasis.  The patient experienced no interim operative complications and was discharged same day as the procedure.  Since that time, the patient has continued to have right upper quadrant abdominal pain that has been worsening, specifically over the past week.  She has been having nausea and vomiting and has been intolerant of oral foods, but has been able to keep down oral liquids.  she has also been experiencing black stools.  Could not get out of bed.  Her father called EMS.  CT scan abdomen and pelvis with contrast:  IMPRESSION: Large bilobed fluid collection likely arising from the gallbladder fossa and extending to the inferior/posterior aspect of the left hepatic lobe, measuring up to 25.3 cm, suspicious for biloma in this patient status post recent cholecystectomy. Additional fluid collection/biloma anterior to the right hepatic lobe.   Hemoglobin found to be 7.0 g down from 13.6 g three weeks ago.  She was transfused 1 unit of packed red blood cells and hemoglobin is up to 7.8 g this morning.  White blood cell count and platelets are normal.  Hemoccult positive.  She has been started on pantoprazole 40 mg IV twice daily.  HIDA scan has been ordered to rule out active/ongoing leak and IR consult has been placed for drain placement of this fluid collection/biloma, both of which will hopefully get done today.  Says that she has been vomiting some dark material but had been taking Pepto-bismol as well.  No vomiting since she came in.  Dark stools ongoing, about 6 since she has come in.  Says  that maybe has seen some clots in the stool also.  No NSAID use in a couple of months.  Past Medical History:  Diagnosis Date   Anxiety    Chronic back pain    Depression    Gout    Hypertension     Past Surgical History:  Procedure Laterality Date   CHOLECYSTECTOMY N/A 02/22/2022   Procedure: LAPAROSCOPIC CHOLECYSTECTOMY;  Surgeon: Aviva Signs, MD;  Location: [facility];  Service: General;  Laterality: N/A;   VIDEO BRONCHOSCOPY N/A 11/11/2014   Procedure: VIDEO BRONCHOSCOPY;  Surgeon: Ivin Poot, MD;  Location: [facility] OR;  Service: Thoracic;  Laterality: N/A;    Prior to Admission medications   Medication Sig Start Date End Date Taking? Authorizing Provider  amLODipine (NORVASC) 10 MG tablet Take 1 tablet (10 mg total) by mouth daily. 02/22/22  Yes Kathie Dike, MD  carvedilol (COREG) 6.25 MG tablet Take 1 tablet (6.25 mg total) by mouth 2 (two) times daily with a meal. 02/22/22  Yes Memon, Jolaine Artist, MD  oxyCODONE (ROXICODONE) 5 MG immediate release tablet Take 1 tablet (5 mg total) by mouth every 4 (four) hours as needed for severe pain. 02/23/22  Yes Aviva Signs, MD    Current Facility-Administered Medications  Medication Dose Route Frequency Provider Last Rate Last Admin   0.9 %  sodium chloride infusion  10 mL/hr Intravenous Once Truett Mainland, DO   Held at 03/17/22 2015   0.9 %  sodium chloride infusion   Intravenous Continuous Truett Mainland, DO 100 mL/hr at 03/18/22 0600 Infusion  Verify at 03/18/22 0600   amLODipine (NORVASC) tablet 10 mg  10 mg Oral Daily Truett Mainland, DO       carvedilol (COREG) tablet 6.25 mg  6.25 mg Oral BID WC Truett Mainland, DO   6.25 mg at 03/18/22 1884   docusate sodium (COLACE) capsule 100 mg  100 mg Oral BID Truett Mainland, DO       HYDROmorphone (DILAUDID) injection 1 mg  1 mg Intravenous Q2H PRN Truett Mainland, DO   1 mg at 03/18/22 1660   ipratropium-albuterol (DUONEB) 0.5-2.5 (3) MG/3ML nebulizer solution 3 mL  3 mL Nebulization Q2H  PRN Rise Patience, MD       ondansetron [facility]) tablet 4 mg  4 mg Oral Q6H PRN Truett Mainland, DO       Or   ondansetron [facility]) injection 4 mg  4 mg Intravenous Q6H PRN Truett Mainland, DO       pantoprazole (PROTONIX) injection 40 mg  40 mg Intravenous Q12H Stinson, Jacob J, DO       polyethylene glycol (MIRALAX / GLYCOLAX) packet 17 g  17 g Oral Daily PRN Truett Mainland, DO        Allergies as of 03/17/2022   (No Known Allergies)    Family History  Family history unknown: Yes    Social History   Socioeconomic History   Marital status: Legally Separated    Spouse name: Not on file   Number of children: Not on file   Years of education: Not on file   Highest education level: Not on file  Occupational History   Not on file  Tobacco Use   Smoking status: Every Day    Packs/day: 1.00    Types: Cigarettes   Smokeless tobacco: Never  Substance and Sexual Activity   Alcohol use: No   Drug use: No   Sexual activity: Not on file  Other Topics Concern   Not on file  Social History Narrative   Not on file   Social Determinants of Health   Financial Resource Strain: Not on file  Food Insecurity: Not on file  Transportation Needs: Not on file  Physical Activity: Not on file  Stress: Not on file  Social Connections: Not on file  Intimate Partner Violence: Not on file   Review of Systems: ROS is O/W negative except as mentioned in HPI.  Physical Exam: Vital signs in last 24 hours: Temp:  [98 F (36.7 C)-99.3 F (37.4 C)] 98.6 F (37 C) (07/29 0628) Pulse Rate:  [95-118] 95 (07/29 0628) Resp:  [18-32] 18 (07/29 0628) BP: (106-138)/(66-89) 106/71 (07/29 0628) SpO2:  [95 %-100 %] 99 % (07/29 0628) Weight:  [76.7 kg] 76.7 kg (07/28 1558) Last BM Date : 03/18/22 General:  Alert, Well-developed, well-nourished, pleasant and cooperative in NAD, but uncomfortable. Head:  Normocephalic and atraumatic. Eyes:  Sclera clear, no icterus.  Conjunctiva  pink. Ears:  Normal auditory acuity. Mouth:  No deformity or lesions.   Lungs:  Wheezing noted B/L. Heart:  Regular rate and rhythm; no murmurs, clicks, rubs, or gallops. Abdomen:  Soft, non-distended.  BS present.  Diffuse TTP, much more so on the right.  Rectal:  Deferred.  Heme positive.  Msk:  Symmetrical without gross deformities. Pulses:  Normal pulses noted. Extremities:  Without clubbing or edema. Neurologic:  Alert and oriented x 4;  grossly normal neurologically. Skin:  Intact without significant lesions or rashes Psych:  Alert and cooperative. Normal  mood and affect.  Intake/Output from previous day: 07/28 0701 - 07/29 0700 In: 2215.7 [P.O.:120; I.V.:437.4; Blood:658.3; IV Piggyback:1000] Out: 400 [Urine:400]  Lab Results: Recent Labs    03/17/22 1611 03/18/22 0057  WBC 8.7 8.1  HGB 7.0* 7.8*  HCT 21.3* 23.9*  PLT 356 305   BMET Recent Labs    03/17/22 1611 03/18/22 0057  NA 136 139  K 4.5 4.5  CL 105 111  CO2 19* 19*  GLUCOSE 148* 108*  BUN 104* 97*  CREATININE 2.69* 2.56*  CALCIUM 7.8* 8.3*   LFT Recent Labs    03/17/22 1611  PROT 6.6  ALBUMIN 2.1*  AST 21  ALT 30  ALKPHOS 203*  BILITOT 0.6   PT/INR Recent Labs    03/18/22 0057  LABPROT 16.3*  INR 1.3*   Studies/Results: DG Chest Port 1 View  Result Date: 03/17/2022 CLINICAL DATA:  Shortness of breath fever EXAM: PORTABLE CHEST 1 VIEW COMPARISON:  03/17/2022, 02/19/2020 FINDINGS: The heart size and mediastinal contours are within normal limits. Both lungs are clear. The visualized skeletal structures are unremarkable. IMPRESSION: No active disease. Electronically Signed   By: Donavan Foil M.D.   On: 03/17/2022 23:34   CT ABDOMEN PELVIS WO CONTRAST  Result Date: 03/17/2022 CLINICAL DATA:  Abdominal pain, nausea/vomiting, recent cholecystectomy EXAM: CT ABDOMEN AND PELVIS WITHOUT CONTRAST TECHNIQUE: Multidetector CT imaging of the abdomen and pelvis was performed following the standard  protocol without IV contrast. RADIATION DOSE REDUCTION: This exam was performed according to the departmental dose-optimization program which includes automated exposure control, adjustment of the mA and/or kV according to patient size and/or use of iterative reconstruction technique. COMPARISON:  Right upper quadrant ultrasound dated 02/21/2022 FINDINGS: Lower chest: Mild lingular scarring. Additional mild subpleural scarring/atelectasis in the lateral left lower lobe. Hepatobiliary: Large bilobed fluid collection likely arising from the gallbladder fossa and extending to the inferior/posterior aspect of the left hepatic lobe, measuring 11.4 x 25.3 x 12.2 cm (coronal image 48), suspicious for biloma in this patient status post recent cholecystectomy. Additional fluid collection/biloma anterior to the right hepatic lobe (series 2/image 30). Mass effect on the liver, which is otherwise unremarkable. Status post cholecystectomy. No intrahepatic or extrahepatic ductal dilatation. Pancreas: Within normal limits. Spleen: Within normal limits. Adrenals/Urinary Tract: Adrenal glands are within normal limits. 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 No renal calculi or hydronephrosis. Bladder is within normal limits. Stomach/Bowel: Mass effect by the dominant fluid collection in the stomach. No evidence of bowel obstruction. Normal appendix (series 2/image 81). No colonic wall thickening or inflammatory changes. Vascular/Lymphatic: No evidence of abdominal aortic aneurysm. Atherosclerotic calcifications of the abdominal aorta and branch vessels. No suspicious abdominopelvic lymphadenopathy. Reproductive: Uterus of normal limits. Bilateral ovaries are within normal limits. Other: Otherwise, no abdominopelvic ascites. Musculoskeletal: Degenerative changes of the visualized thoracolumbar spine. IMPRESSION: Large bilobed fluid collection likely arising from the  gallbladder fossa and extending to the inferior/posterior aspect of the left hepatic lobe, measuring up to 25.3 cm, suspicious for biloma in this patient status post recent cholecystectomy. Additional fluid collection/biloma anterior to the right hepatic lobe. Additional ancillary findings as above. These results were called by telephone at the time of interpretation on 03/17/2022 at 6:35 pm to provider [facility] , who verbally acknowledged these results. Electronically Signed   By: Julian Hy M.D.   On: 03/17/2022 18:40   DG Chest Port 1 View  Result Date: 03/17/2022 CLINICAL DATA:  Shortness of breath EXAM: PORTABLE CHEST 1 VIEW COMPARISON:  02/19/2020 FINDINGS: Lungs are clear.  No pleural effusion or pneumothorax. The heart is normal in size. Metallic device overlying the left neck (incompletely imaged) is likely external to the patient. IMPRESSION: No evidence of acute cardiopulmonary disease. Electronically Signed   By: Julian Hy M.D.   On: 03/17/2022 17:49    IMPRESSION:  *Large fluid collection status post cholecystectomy suspicious for biloma vs hematoma that measures 25 cm in size.  HIDA scan is pending to rule out ongoing/active leak.  IR is also been consulted for drainage placement *Black stools with melena and significant drop in hemoglobin, suspect upper GI bleed.  Is on pantoprazole 40 mg IV twice daily.  BUN is elevated significantly from baseline-104 on admission yesterday. *Acute blood loss anemia: Hemoglobin was 13.6 g 3 weeks ago and is down to 7.0 g yesterday, 7.8 g today after 1 unit of packed red blood cells.  Hemoccult positive.  ? Due to GI bleed or could this fluid collection be blood.  BUN is elevated significantly from baseline, however. *Chronic kidney disease  PLAN: -Continue pantoprazole 40 mg IV twice daily. -Trend hemoglobin and transfuse further if needed. -Await results of HIDA scan.  If still has ongoing/active leak likely will need ERCP with  stent placement. -Await IR drainage placement that will be done today. -Will plan for tentative EGD on 7/30 after drain placement and HIDA are performed today.  Laban Emperor. Nelsy Madonna  03/18/2022, 9:03 AM

## 2022-03-18 NOTE — Assessment & Plan Note (Signed)
Baseline in last 6 months appaers to be in the 1.9-2.1 range.  CKD III ruled out, this would be CKD IV.

## 2022-03-18 NOTE — Consult Note (Addendum)
Chief Complaint: Patient was seen in consultation today for gall bladder fossa fluid collection  Chief Complaint  Patient presents with   Abdominal Pain   at the request of Loma Boston   Referring Physician(s): Loma Boston   Supervising Physician: Mir, Sharen Heck  Patient Status: [facility] - In-pt  History of Present Illness: Margaret Yoder is a 46 y.o. female with PMHs of HTN, gout, CKD stage IV, depression and anxiety who was found to have large fluid collection in gallbladder fossa.   Patient underwent lap cholecystectomy on 02/22/22, she presented to [facility] ED on 7/28 due to abdominal pain and N/V. At the time of the presentation, she was tachycardic and tachypenic, afebrile. CBC showed normal WBC but anemia with hgb 7.0, fecal occult was positive, patient received blood transfusion, GI was consulted. CMP showed AKI superimposed on CKD. Patient underwent CT [facility] wo which showed:   Large bilobed fluid collection likely arising from the gallbladder fossa and extending to the inferior/posterior aspect of the left hepatic lobe, measuring up to 25.3 cm, suspicious for biloma in this patient status post recent cholecystectomy. Additional fluid collection/biloma anterior to the right hepatic lobe.  IR was requested for image guided drain placement.  Dr. Earleen Newport reviewed the CT, decision was made to transfer the patient to [facility] for the procedure.   Patient laying in bed, not in acute distress. MD and RN at bedside.  Patient reports persistent abdominal pain, but she denies N/V this morning.  Denise headache, fever, chills, shortness of breath, cough, chest pain, nausea ,vomiting, and bleeding.  Of note, GI planning possible EGD tomorrow AM.   Past Medical History:  Diagnosis Date   Anxiety    Chronic back pain    Depression    Gout    Hypertension     Past Surgical History:  Procedure Laterality Date   CHOLECYSTECTOMY N/A 02/22/2022   Procedure: LAPAROSCOPIC CHOLECYSTECTOMY;   Surgeon: Aviva Signs, MD;  Location: [facility];  Service: General;  Laterality: N/A;   VIDEO BRONCHOSCOPY N/A 11/11/2014   Procedure: VIDEO BRONCHOSCOPY;  Surgeon: Ivin Poot, MD;  Location: [facility] OR;  Service: Thoracic;  Laterality: N/A;    Allergies: Patient has no known allergies.  Medications: Prior to Admission medications   Medication Sig Start Date End Date Taking? Authorizing Provider  amLODipine (NORVASC) 10 MG tablet Take 1 tablet (10 mg total) by mouth daily. 02/22/22  Yes Kathie Dike, MD  carvedilol (COREG) 6.25 MG tablet Take 1 tablet (6.25 mg total) by mouth 2 (two) times daily with a meal. 02/22/22  Yes Memon, Jolaine Artist, MD  oxyCODONE (ROXICODONE) 5 MG immediate release tablet Take 1 tablet (5 mg total) by mouth every 4 (four) hours as needed for severe pain. 02/23/22  Yes Aviva Signs, MD     Family History  Family history unknown: Yes    Social History   Socioeconomic History   Marital status: Legally Separated    Spouse name: Not on file   Number of children: Not on file   Years of education: Not on file   Highest education level: Not on file  Occupational History   Not on file  Tobacco Use   Smoking status: Every Day    Packs/day: 1.00    Types: Cigarettes   Smokeless tobacco: Never  Substance and Sexual Activity   Alcohol use: No   Drug use: No   Sexual activity: Not on file  Other Topics Concern   Not on file  Social History  Narrative   Not on file   Social Determinants of Health   Financial Resource Strain: Not on file  Food Insecurity: Not on file  Transportation Needs: Not on file  Physical Activity: Not on file  Stress: Not on file  Social Connections: Not on file     Review of Systems: A 12 point ROS discussed and pertinent positives are indicated in the HPI above.  All other systems are negative.  Vital Signs: BP 106/71 (BP Location: Right Arm)   Pulse 95   Temp 98.6 F (37 C) (Oral)   Resp 18   Ht 5\' 3"  (1.6 m)   Wt 169 lb 1.5  oz (76.7 kg)   SpO2 99%   BMI 29.95 kg/m    Physical Exam Vitals and nursing note reviewed.  Constitutional:      General: Patient is not in acute distress.    Appearance: Normal appearance. Patient is not ill-appearing.  HENT:     Head: Normocephalic and atraumatic.     Mouth/Throat:     Mouth: Mucous membranes are moist.     Pharynx: Oropharynx is clear.  Cardiovascular:     Rate and Rhythm: Normal rate and regular rhythm.     Pulses: Normal pulses.     Heart sounds: Normal heart sounds.  Pulmonary:     Effort: Pulmonary effort is normal.     Breath sounds: Normal breath sounds.  Abdominal:     General: Abdomen is flat. Bowel sounds are normal.     Palpations: Abdomen is soft.  Musculoskeletal:     Cervical back: Neck supple.  Skin:    General: Skin is warm and dry.     Coloration: Skin is not jaundiced or pale.  Neurological:     Mental Status: Patient is alert and oriented to person, place, and time.  Psychiatric:        Mood and Affect: Mood normal.        Behavior: Behavior normal.        Judgment: Judgment normal.    MD Evaluation Airway: WNL Heart: WNL Abdomen: WNL Chest/ Lungs: WNL ASA  Classification: 3 Mallampati/Airway Score: Two  Imaging: DG Chest Port 1 View  Result Date: 03/17/2022 CLINICAL DATA:  Shortness of breath fever EXAM: PORTABLE CHEST 1 VIEW COMPARISON:  03/17/2022, 02/19/2020 FINDINGS: The heart size and mediastinal contours are within normal limits. Both lungs are clear. The visualized skeletal structures are unremarkable. IMPRESSION: No active disease. Electronically Signed   By: Donavan Foil M.D.   On: 03/17/2022 23:34   CT ABDOMEN PELVIS WO CONTRAST  Result Date: 03/17/2022 CLINICAL DATA:  Abdominal pain, nausea/vomiting, recent cholecystectomy EXAM: CT ABDOMEN AND PELVIS WITHOUT CONTRAST TECHNIQUE: Multidetector CT imaging of the abdomen and pelvis was performed following the standard protocol without IV contrast. RADIATION DOSE  REDUCTION: This exam was performed according to the departmental dose-optimization program which includes automated exposure control, adjustment of the mA and/or kV according to patient size and/or use of iterative reconstruction technique. COMPARISON:  Right upper quadrant ultrasound dated 02/21/2022 FINDINGS: Lower chest: Mild lingular scarring. Additional mild subpleural scarring/atelectasis in the lateral left lower lobe. Hepatobiliary: Large bilobed fluid collection likely arising from the gallbladder fossa and extending to the inferior/posterior aspect of the left hepatic lobe, measuring 11.4 x 25.3 x 12.2 cm (coronal image 48), suspicious for biloma in this patient status post recent cholecystectomy. Additional fluid collection/biloma anterior to the right hepatic lobe (series 2/image 30). Mass effect on the liver, which is  otherwise unremarkable. Status post cholecystectomy. No intrahepatic or extrahepatic ductal dilatation. Pancreas: Within normal limits. Spleen: Within normal limits. Adrenals/Urinary Tract: Adrenal glands are within normal limits. 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 No renal calculi or hydronephrosis. Bladder is within normal limits. Stomach/Bowel: Mass effect by the dominant fluid collection in the stomach. No evidence of bowel obstruction. Normal appendix (series 2/image 81). No colonic wall thickening or inflammatory changes. Vascular/Lymphatic: No evidence of abdominal aortic aneurysm. Atherosclerotic calcifications of the abdominal aorta and branch vessels. No suspicious abdominopelvic lymphadenopathy. Reproductive: Uterus of normal limits. Bilateral ovaries are within normal limits. Other: Otherwise, no abdominopelvic ascites. Musculoskeletal: Degenerative changes of the visualized thoracolumbar spine. IMPRESSION: Large bilobed fluid collection likely arising from the gallbladder fossa and extending to the  inferior/posterior aspect of the left hepatic lobe, measuring up to 25.3 cm, suspicious for biloma in this patient status post recent cholecystectomy. Additional fluid collection/biloma anterior to the right hepatic lobe. Additional ancillary findings as above. These results were called by telephone at the time of interpretation on 03/17/2022 at 6:35 pm to provider [facility] , who verbally acknowledged these results. Electronically Signed   By: Julian Hy M.D.   On: 03/17/2022 18:40   DG Chest Port 1 View  Result Date: 03/17/2022 CLINICAL DATA:  Shortness of breath EXAM: PORTABLE CHEST 1 VIEW COMPARISON:  02/19/2020 FINDINGS: Lungs are clear.  No pleural effusion or pneumothorax. The heart is normal in size. Metallic device overlying the left neck (incompletely imaged) is likely external to the patient. IMPRESSION: No evidence of acute cardiopulmonary disease. Electronically Signed   By: Julian Hy M.D.   On: 03/17/2022 17:49   US Abdomen Limited RUQ (LIVER/GB)  Result Date: 02/21/2022 CLINICAL DATA:  Right upper quadrant pain. EXAM: ULTRASOUND ABDOMEN LIMITED RIGHT UPPER QUADRANT COMPARISON:  None Available. FINDINGS: Gallbladder: Gallstones evident measuring up to 2.7 cm. Gallbladder wall appears heterogeneous and thickened up to 7 mm. Sonographer reports no sonographic Murphy sign. Common bile duct: Diameter: 9 mm Liver: Increased echogenicity of liver parenchyma suggests fatty deposition. No focal abnormality identified. Portal vein is patent on color Doppler imaging with normal direction of blood flow towards the liver. Other: None. IMPRESSION: 1. Cholelithiasis with gallbladder wall thickening. Acute cholecystitis is a consideration. If clinical picture is equivocal, nuclear scintigraphy may prove helpful to assess for cystic duct obstruction. 2. Mild dilatation of the common bile duct, indeterminate. Correlation with liver function tests recommended. MRCP/ERCP may prove helpful to  assess for choledocholithiasis. Electronically Signed   By: Misty Stanley M.D.   On: 02/21/2022 10:45    Labs:  CBC: Recent Labs    02/21/22 0450 02/22/22 0501 03/17/22 1611 03/18/22 0057  WBC 11.7*  11.7* 13.8* 8.7 8.1  HGB 13.2  13.3 13.6 7.0* 7.8*  HCT 40.3  41.1 41.5 21.3* 23.9*  PLT 229  226 252 356 305    COAGS: Recent Labs    02/21/22 0450 03/18/22 0057  INR  --  1.3*  APTT 29  --     BMP: Recent Labs    02/21/22 0450 02/22/22 0501 03/17/22 1611 03/18/22 0057  NA 136 135 136 139  K 4.9 4.6 4.5 4.5  CL 109 105 105 111  CO2 19* 21* 19* 19*  GLUCOSE 142* 121* 148* 108*  BUN 21* 24* 104* 97*  CALCIUM 8.5* 8.8* 7.8* 8.3*  CREATININE 2.03*  2.01* 1.94* 2.69* 2.56*  GFRNONAA 30*  30* 32*  21* 23*    LIVER FUNCTION TESTS: Recent Labs    02/20/22 1842 02/21/22 0450 02/22/22 0501 03/17/22 1611  BILITOT 0.5 0.3 0.4 0.6  AST 17 14* 11* 21  ALT 15 13 10 30   ALKPHOS 131* 103 97 203*  PROT 9.0* 7.4 7.8 6.6  ALBUMIN 3.8 3.1* 3.1* 2.1*    TUMOR MARKERS: No results for input(s): "AFPTM", "CEA", "CA199", "CHROMGRNA" in the last 8760 hours.  Assessment and Plan: 46 y.o. female with recent cholecystectomy who presnted to [facility] ED due to abd pain and N/V, was found to have large boiled fluid collection most likely arising from the gallbladder fossa extending to the inferior/posterior aspect of the left hepatic lobe.   Case reviewed with Dr. Dwaine Gale this AM again, approved for US guided aspiration and drain placement.   NPO since MN VSS CBC today with persistent anemia 7.8, no leukocytosis  INR 1.3  RF improving  Was not on AC/AP as outpt, currently not on AC/AP   Risks and benefits discussed with the patient including bleeding, infection, damage to adjacent structures, bowel perforation/fistula connection, and sepsis.  All of the patient's questions were answered, patient is agreeable to proceed. Consent signed and in chart.    Thank you for this  interesting consult.  I greatly enjoyed meeting LEODA SMITHHART and look forward to participating in their care.  A copy of this report was sent to the requesting provider on this date.  Electronically Signed: Tera Mater, PA-C 03/18/2022, 10:48 AM   I spent a total of 20 Minutes    in face to face in clinical consultation, greater than 50% of which was counseling/coordinating care for GB fossa fluid collection.   This chart was dictated using voice recognition software.  Despite best efforts to proofread,  errors can occur which can change the documentation meaning.

## 2022-03-18 NOTE — H&P (View-Only) (Signed)
Referring Provider: No ref. provider found Primary Care Physician:  Candi Leash, PA-C Primary Gastroenterologist: Althia Forts  Reason for Consultation:  Biloma, bile leak  HPI: Margaret Yoder is a 46 y.o. female with medical history significant of HTN, h/o gout, also looks like some chronic kidney disease and COPD. She had a laparoscopic cholecystectomy on 7/5 due to cholecystitis and cholelithiasis.  The patient experienced no interim operative complications and was discharged same day as the procedure.  Since that time, the patient has continued to have right upper quadrant abdominal pain that has been worsening, specifically over the past week.  She has been having nausea and vomiting and has been intolerant of oral foods, but has been able to keep down oral liquids.  she has also been experiencing black stools.  Could not get out of bed.  Her father called EMS.  CT scan abdomen and pelvis with contrast:  IMPRESSION: Large bilobed fluid collection likely arising from the gallbladder fossa and extending to the inferior/posterior aspect of the left hepatic lobe, measuring up to 25.3 cm, suspicious for biloma in this patient status post recent cholecystectomy. Additional fluid collection/biloma anterior to the right hepatic lobe.   Hemoglobin found to be 7.0 g down from 13.6 g three weeks ago.  She was transfused 1 unit of packed red blood cells and hemoglobin is up to 7.8 g this morning.  White blood cell count and platelets are normal.  Hemoccult positive.  She has been started on pantoprazole 40 mg IV twice daily.  HIDA scan has been ordered to rule out active/ongoing leak and IR consult has been placed for drain placement of this fluid collection/biloma, both of which will hopefully get done today.  Says that she has been vomiting some dark material but had been taking Pepto-bismol as well.  No vomiting since she came in.  Dark stools ongoing, about 6 since she has come in.  Says  that maybe has seen some clots in the stool also.  No NSAID use in a couple of months.  Past Medical History:  Diagnosis Date   Anxiety    Chronic back pain    Depression    Gout    Hypertension     Past Surgical History:  Procedure Laterality Date   CHOLECYSTECTOMY N/A 02/22/2022   Procedure: LAPAROSCOPIC CHOLECYSTECTOMY;  Surgeon: Aviva Signs, MD;  Location: [facility];  Service: General;  Laterality: N/A;   VIDEO BRONCHOSCOPY N/A 11/11/2014   Procedure: VIDEO BRONCHOSCOPY;  Surgeon: Ivin Poot, MD;  Location: [facility] OR;  Service: Thoracic;  Laterality: N/A;    Prior to Admission medications   Medication Sig Start Date End Date Taking? Authorizing Provider  amLODipine (NORVASC) 10 MG tablet Take 1 tablet (10 mg total) by mouth daily. 02/22/22  Yes Kathie Dike, MD  carvedilol (COREG) 6.25 MG tablet Take 1 tablet (6.25 mg total) by mouth 2 (two) times daily with a meal. 02/22/22  Yes Memon, Jolaine Artist, MD  oxyCODONE (ROXICODONE) 5 MG immediate release tablet Take 1 tablet (5 mg total) by mouth every 4 (four) hours as needed for severe pain. 02/23/22  Yes Aviva Signs, MD    Current Facility-Administered Medications  Medication Dose Route Frequency Provider Last Rate Last Admin   0.9 %  sodium chloride infusion  10 mL/hr Intravenous Once Truett Mainland, DO   Held at 03/17/22 2015   0.9 %  sodium chloride infusion   Intravenous Continuous Truett Mainland, DO 100 mL/hr at 03/18/22 0600 Infusion  Verify at 03/18/22 0600   amLODipine (NORVASC) tablet 10 mg  10 mg Oral Daily Truett Mainland, DO       carvedilol (COREG) tablet 6.25 mg  6.25 mg Oral BID WC Truett Mainland, DO   6.25 mg at 03/18/22 1601   docusate sodium (COLACE) capsule 100 mg  100 mg Oral BID Truett Mainland, DO       HYDROmorphone (DILAUDID) injection 1 mg  1 mg Intravenous Q2H PRN Truett Mainland, DO   1 mg at 03/18/22 0932   ipratropium-albuterol (DUONEB) 0.5-2.5 (3) MG/3ML nebulizer solution 3 mL  3 mL Nebulization Q2H  PRN Rise Patience, MD       ondansetron [facility]) tablet 4 mg  4 mg Oral Q6H PRN Truett Mainland, DO       Or   ondansetron [facility]) injection 4 mg  4 mg Intravenous Q6H PRN Truett Mainland, DO       pantoprazole (PROTONIX) injection 40 mg  40 mg Intravenous Q12H Stinson, Jacob J, DO       polyethylene glycol (MIRALAX / GLYCOLAX) packet 17 g  17 g Oral Daily PRN Truett Mainland, DO        Allergies as of 03/17/2022   (No Known Allergies)    Family History  Family history unknown: Yes    Social History   Socioeconomic History   Marital status: Legally Separated    Spouse name: Not on file   Number of children: Not on file   Years of education: Not on file   Highest education level: Not on file  Occupational History   Not on file  Tobacco Use   Smoking status: Every Day    Packs/day: 1.00    Types: Cigarettes   Smokeless tobacco: Never  Substance and Sexual Activity   Alcohol use: No   Drug use: No   Sexual activity: Not on file  Other Topics Concern   Not on file  Social History Narrative   Not on file   Social Determinants of Health   Financial Resource Strain: Not on file  Food Insecurity: Not on file  Transportation Needs: Not on file  Physical Activity: Not on file  Stress: Not on file  Social Connections: Not on file  Intimate Partner Violence: Not on file   Review of Systems: ROS is O/W negative except as mentioned in HPI.  Physical Exam: Vital signs in last 24 hours: Temp:  [98 F (36.7 C)-99.3 F (37.4 C)] 98.6 F (37 C) (07/29 0628) Pulse Rate:  [95-118] 95 (07/29 0628) Resp:  [18-32] 18 (07/29 0628) BP: (106-138)/(66-89) 106/71 (07/29 0628) SpO2:  [95 %-100 %] 99 % (07/29 0628) Weight:  [76.7 kg] 76.7 kg (07/28 1558) Last BM Date : 03/18/22 General:  Alert, Well-developed, well-nourished, pleasant and cooperative in NAD, but uncomfortable. Head:  Normocephalic and atraumatic. Eyes:  Sclera clear, no icterus.  Conjunctiva  pink. Ears:  Normal auditory acuity. Mouth:  No deformity or lesions.   Lungs:  Wheezing noted B/L. Heart:  Regular rate and rhythm; no murmurs, clicks, rubs, or gallops. Abdomen:  Soft, non-distended.  BS present.  Diffuse TTP, much more so on the right.  Rectal:  Deferred.  Heme positive.  Msk:  Symmetrical without gross deformities. Pulses:  Normal pulses noted. Extremities:  Without clubbing or edema. Neurologic:  Alert and oriented x 4;  grossly normal neurologically. Skin:  Intact without significant lesions or rashes Psych:  Alert and cooperative. Normal  mood and affect.  Intake/Output from previous day: 07/28 0701 - 07/29 0700 In: 2215.7 [P.O.:120; I.V.:437.4; Blood:658.3; IV Piggyback:1000] Out: 400 [Urine:400]  Lab Results: Recent Labs    03/17/22 1611 03/18/22 0057  WBC 8.7 8.1  HGB 7.0* 7.8*  HCT 21.3* 23.9*  PLT 356 305   BMET Recent Labs    03/17/22 1611 03/18/22 0057  NA 136 139  K 4.5 4.5  CL 105 111  CO2 19* 19*  GLUCOSE 148* 108*  BUN 104* 97*  CREATININE 2.69* 2.56*  CALCIUM 7.8* 8.3*   LFT Recent Labs    03/17/22 1611  PROT 6.6  ALBUMIN 2.1*  AST 21  ALT 30  ALKPHOS 203*  BILITOT 0.6   PT/INR Recent Labs    03/18/22 0057  LABPROT 16.3*  INR 1.3*   Studies/Results: DG Chest Port 1 View  Result Date: 03/17/2022 CLINICAL DATA:  Shortness of breath fever EXAM: PORTABLE CHEST 1 VIEW COMPARISON:  03/17/2022, 02/19/2020 FINDINGS: The heart size and mediastinal contours are within normal limits. Both lungs are clear. The visualized skeletal structures are unremarkable. IMPRESSION: No active disease. Electronically Signed   By: Donavan Foil M.D.   On: 03/17/2022 23:34   CT ABDOMEN PELVIS WO CONTRAST  Result Date: 03/17/2022 CLINICAL DATA:  Abdominal pain, nausea/vomiting, recent cholecystectomy EXAM: CT ABDOMEN AND PELVIS WITHOUT CONTRAST TECHNIQUE: Multidetector CT imaging of the abdomen and pelvis was performed following the standard  protocol without IV contrast. RADIATION DOSE REDUCTION: This exam was performed according to the departmental dose-optimization program which includes automated exposure control, adjustment of the mA and/or kV according to patient size and/or use of iterative reconstruction technique. COMPARISON:  Right upper quadrant ultrasound dated 02/21/2022 FINDINGS: Lower chest: Mild lingular scarring. Additional mild subpleural scarring/atelectasis in the lateral left lower lobe. Hepatobiliary: Large bilobed fluid collection likely arising from the gallbladder fossa and extending to the inferior/posterior aspect of the left hepatic lobe, measuring 11.4 x 25.3 x 12.2 cm (coronal image 48), suspicious for biloma in this patient status post recent cholecystectomy. Additional fluid collection/biloma anterior to the right hepatic lobe (series 2/image 30). Mass effect on the liver, which is otherwise unremarkable. Status post cholecystectomy. No intrahepatic or extrahepatic ductal dilatation. Pancreas: Within normal limits. Spleen: Within normal limits. Adrenals/Urinary Tract: Adrenal glands are within normal limits. 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 No renal calculi or hydronephrosis. Bladder is within normal limits. Stomach/Bowel: Mass effect by the dominant fluid collection in the stomach. No evidence of bowel obstruction. Normal appendix (series 2/image 81). No colonic wall thickening or inflammatory changes. Vascular/Lymphatic: No evidence of abdominal aortic aneurysm. Atherosclerotic calcifications of the abdominal aorta and branch vessels. No suspicious abdominopelvic lymphadenopathy. Reproductive: Uterus of normal limits. Bilateral ovaries are within normal limits. Other: Otherwise, no abdominopelvic ascites. Musculoskeletal: Degenerative changes of the visualized thoracolumbar spine. IMPRESSION: Large bilobed fluid collection likely arising from the  gallbladder fossa and extending to the inferior/posterior aspect of the left hepatic lobe, measuring up to 25.3 cm, suspicious for biloma in this patient status post recent cholecystectomy. Additional fluid collection/biloma anterior to the right hepatic lobe. Additional ancillary findings as above. These results were called by telephone at the time of interpretation on 03/17/2022 at 6:35 pm to provider [facility] , who verbally acknowledged these results. Electronically Signed   By: Julian Hy M.D.   On: 03/17/2022 18:40   DG Chest Port 1 View  Result Date: 03/17/2022 CLINICAL DATA:  Shortness of breath EXAM: PORTABLE CHEST 1 VIEW COMPARISON:  02/19/2020 FINDINGS: Lungs are clear.  No pleural effusion or pneumothorax. The heart is normal in size. Metallic device overlying the left neck (incompletely imaged) is likely external to the patient. IMPRESSION: No evidence of acute cardiopulmonary disease. Electronically Signed   By: Julian Hy M.D.   On: 03/17/2022 17:49    IMPRESSION:  *Large fluid collection status post cholecystectomy suspicious for biloma vs hematoma that measures 25 cm in size.  HIDA scan is pending to rule out ongoing/active leak.  IR is also been consulted for drainage placement *Black stools with melena and significant drop in hemoglobin, suspect upper GI bleed.  Is on pantoprazole 40 mg IV twice daily.  BUN is elevated significantly from baseline-104 on admission yesterday. *Acute blood loss anemia: Hemoglobin was 13.6 g 3 weeks ago and is down to 7.0 g yesterday, 7.8 g today after 1 unit of packed red blood cells.  Hemoccult positive.  ? Due to GI bleed or could this fluid collection be blood.  BUN is elevated significantly from baseline, however. *Chronic kidney disease  PLAN: -Continue pantoprazole 40 mg IV twice daily. -Trend hemoglobin and transfuse further if needed. -Await results of HIDA scan.  If still has ongoing/active leak likely will need ERCP with  stent placement. -Await IR drainage placement that will be done today. -Will plan for tentative EGD on 7/30 after drain placement and HIDA are performed today.  Laban Emperor. Jex Strausbaugh  03/18/2022, 9:03 AM

## 2022-03-18 NOTE — Progress Notes (Addendum)
Patient is status post an uneventful laparoscopic cholecystectomy on 02/22/2022.  She presented to the emergency room at [facility] on 03/17/2022 with worsening nausea, hematemesis, and diarrhea.  CT scan results revealed a very large bilobed fluid collection, either a biloma or hematoma.  She was noted to have severe anemia and was transfused 1 unit of packed red blood cells.  As [facility] does not have interventional radiology or ERCP capabilities (if this is a bile leak), the patient was transferred to [facility] for further management and treatment.  I discussed this with Dr. Marlou Starks after he had seen the patient.  I appreciate the multiple consultations.  Should the patient require any surgical intervention, I would be more than happy to take the patient back to [facility].  Will follow with you.

## 2022-03-18 NOTE — Anesthesia Preprocedure Evaluation (Addendum)
Anesthesia Evaluation  Patient identified by MRN, date of birth, ID band Patient awake    Reviewed: Allergy & Precautions, NPO status , Patient's Chart, lab work & pertinent test results, reviewed documented beta blocker date and time   History of Anesthesia Complications Negative for: history of anesthetic complications  Airway Mallampati: II  TM Distance: >3 FB Neck ROM: Full    Dental  (+) Dental Advisory Given   Pulmonary Current Smoker and Patient abstained from smoking.,    breath sounds clear to auscultation       Cardiovascular hypertension, Pt. on medications and Pt. on home beta blockers (-) angina Rhythm:Regular Rate:Normal     Neuro/Psych PSYCHIATRIC DISORDERS (ADHD) Anxiety Depression negative neurological ROS     GI/Hepatic (+)     substance abuse (recovered x2 years)  , Elevated alk phos s/p cholecystectomy Abdominal hematoma s/p cholecystectomy: n/v   Endo/Other  negative endocrine ROS  Renal/GU Renal InsufficiencyRenal disease (not on dialysis yet)     Musculoskeletal   Abdominal   Peds  Hematology  (+) Blood dyscrasia (Hb 9.7), anemia ,   Anesthesia Other Findings   Reproductive/Obstetrics                            Anesthesia Physical Anesthesia Plan  ASA: 3  Anesthesia Plan: General   Post-op Pain Management: Minimal or no pain anticipated   Induction:   PONV Risk Score and Plan: 2 and Ondansetron  Airway Management Planned: Oral ETT  Additional Equipment: None  Intra-op Plan:   Post-operative Plan: Extubation in OR  Informed Consent: I have reviewed the patients History and Physical, chart, labs and discussed the procedure including the risks, benefits and alternatives for the proposed anesthesia with the patient or authorized representative who has indicated his/her understanding and acceptance.     Dental advisory given  Plan Discussed with:  CRNA and Surgeon  Anesthesia Plan Comments:        Anesthesia Quick Evaluation

## 2022-03-18 NOTE — Consult Note (Signed)
Reason for Consult:abd pain Referring Physician: Dr. Daune Perch is an 46 y.o. female.  HPI: The patient is a 46 year old white female who is about 3 weeks status post laparoscopic cholecystectomy by Dr. Arnoldo Morale.  Since the time of surgery she states that she has had abdominal pain and has been unable to keep food down.  She denies any fevers or chills.  She denies any lightheadedness.  She went to the emergency department where a CT scan showed a large 25 cm fluid collection associated with the liver.  She does smoke.  Past Medical History:  Diagnosis Date   Anxiety    Chronic back pain    Depression    Gout    Hypertension     Past Surgical History:  Procedure Laterality Date   CHOLECYSTECTOMY N/A 02/22/2022   Procedure: LAPAROSCOPIC CHOLECYSTECTOMY;  Surgeon: Aviva Signs, MD;  Location: [facility];  Service: General;  Laterality: N/A;   VIDEO BRONCHOSCOPY N/A 11/11/2014   Procedure: VIDEO BRONCHOSCOPY;  Surgeon: Ivin Poot, MD;  Location: [facility] OR;  Service: Thoracic;  Laterality: N/A;    Family History  Family history unknown: Yes    Social History:  reports that she has been smoking cigarettes. She has been smoking an average of 1 pack per day. She has never used smokeless tobacco. She reports that she does not drink alcohol and does not use drugs.  Allergies: No Known Allergies  Medications: I have reviewed the patient's current medications.  Results for orders placed or performed during the hospital encounter of 03/17/22 (from the past 48 hour(s))  Urinalysis, Routine w reflex microscopic Urine, Clean Catch     Status: Abnormal   Collection Time: 03/17/22  4:04 PM  Result Value Ref Range   Color, Urine YELLOW YELLOW   APPearance CLEAR CLEAR   Specific Gravity, Urine 1.010 1.005 - 1.030   pH 5.0 5.0 - 8.0   Glucose, UA NEGATIVE NEGATIVE mg/dL   Hgb urine dipstick SMALL (A) NEGATIVE   Bilirubin Urine NEGATIVE NEGATIVE   Ketones, ur NEGATIVE NEGATIVE mg/dL    Protein, ur 30 (A) NEGATIVE mg/dL   Nitrite NEGATIVE NEGATIVE   Leukocytes,Ua NEGATIVE NEGATIVE   WBC, UA 0-5 0 - 5 WBC/hpf   Bacteria, UA RARE (A) NONE SEEN   Squamous Epithelial / LPF 0-5 0 - 5   Mucus PRESENT     Comment: Performed at [facility], [address], [place], [place] [address]  Lipase, blood     Status: Abnormal   Collection Time: 03/17/22  4:11 PM  Result Value Ref Range   Lipase 65 (H) 11 - 51 U/L    Comment: Performed at [facility], [address] [place], [place] [address]  Comprehensive metabolic panel     Status: Abnormal   Collection Time: 03/17/22  4:11 PM  Result Value Ref Range   Sodium 136 135 - 145 mmol/L   Potassium 4.5 3.5 - 5.1 mmol/L   Chloride 105 98 - 111 mmol/L   CO2 19 (L) 22 - 32 mmol/L   Glucose, Bld 148 (H) 70 - 99 mg/dL    Comment: Glucose reference range applies only to samples taken after fasting for at least 8 hours.   BUN 104 (H) 6 - 20 mg/dL   Creatinine, Ser 2.69 (H) 0.44 - 1.00 mg/dL   Calcium 7.8 (L) 8.9 - 10.3 mg/dL   Total Protein 6.6 6.5 - 8.1 g/dL   Albumin 2.1 (L) 3.5 - 5.0 g/dL  AST 21 15 - 41 U/L   ALT 30 0 - 44 U/L   Alkaline Phosphatase 203 (H) 38 - 126 U/L   Total Bilirubin 0.6 0.3 - 1.2 mg/dL   GFR, Estimated 21 (L) >60 mL/min    Comment: (NOTE) Calculated using the CKD-EPI Creatinine Equation (2021)    Anion gap 12 5 - 15    Comment: Performed at [facility], [address], [place], [place] [address]  CBC     Status: Abnormal   Collection Time: 03/17/22  4:11 PM  Result Value Ref Range   WBC 8.7 4.0 - 10.5 K/uL   RBC 2.49 (L) 3.87 - 5.11 MIL/uL   Hemoglobin 7.0 (L) 12.0 - 15.0 g/dL    Comment: REPEATED TO VERIFY   HCT 21.3 (L) 36.0 - 46.0 %   MCV 85.5 80.0 - 100.0 fL   MCH 28.1 26.0 - 34.0 pg   MCHC 32.9 30.0 - 36.0 g/dL   RDW 13.7 11.5 - 15.5 %   Platelets 356 150 - 400 K/uL   nRBC 0.0 0.0 - 0.2 %    Comment: Performed at [facility], [address], [place], [place] [address]  ABO/Rh     Status:  None   Collection Time: 03/17/22  4:11 PM  Result Value Ref Range   ABO/RH(D)      A POS Performed at [facility], [address], [place], [place] [address]   Prepare RBC (crossmatch)     Status: None   Collection Time: 03/17/22  6:30 PM  Result Value Ref Range   Order Confirmation      ORDER PROCESSED BY BLOOD BANK Performed at [facility], [address], [place], [place] [address]   Type and screen     Status: None (Preliminary result)   Collection Time: 03/17/22  6:30 PM  Result Value Ref Range   ABO/RH(D) A POS    Antibody Screen NEG    Sample Expiration 03/20/2022,2359    Unit Number [id number]    Blood Component Type RED CELLS,LR    Unit division 00    Status of Unit ISSUED    Transfusion Status OK TO TRANSFUSE    Crossmatch Result      Compatible Performed at [facility], [address], [place], [place] [address]   POC occult blood, ED     Status: Abnormal   Collection Time: 03/17/22  7:00 PM  Result Value Ref Range   Fecal Occult Bld POSITIVE (A) NEGATIVE  Basic metabolic panel     Status: Abnormal   Collection Time: 03/18/22 12:57 AM  Result Value Ref Range   Sodium 139 135 - 145 mmol/L   Potassium 4.5 3.5 - 5.1 mmol/L   Chloride 111 98 - 111 mmol/L   CO2 19 (L) 22 - 32 mmol/L   Glucose, Bld 108 (H) 70 - 99 mg/dL    Comment: Glucose reference range applies only to samples taken after fasting for at least 8 hours.   BUN 97 (H) 6 - 20 mg/dL   Creatinine, Ser 2.56 (H) 0.44 - 1.00 mg/dL   Calcium 8.3 (L) 8.9 - 10.3 mg/dL   GFR, Estimated 23 (L) >60 mL/min    Comment: (NOTE) Calculated using the CKD-EPI Creatinine Equation (2021)    Anion gap 9 5 - 15    Comment: Performed at [place] [address], [place], [place] [address]  Protime-INR     Status: Abnormal   Collection Time: 03/18/22 12:57  AM  Result Value Ref Range   Prothrombin Time 16.3 (H) 11.4 - 15.2 seconds   INR 1.3 (H) 0.8 - 1.2    Comment: (NOTE) INR goal varies based on  device and disease states. Performed at [facility], [place] [address], [place], [place] [address]   Type and screen [place]     Status: None (Preliminary result)   Collection Time: 03/18/22 12:57 AM  Result Value Ref Range   ABO/RH(D) A POS    Antibody Screen NEG    Sample Expiration 03/21/2022,2359    Unit Number [id number]    Blood Component Type RED CELLS,LR    Unit division 00    Status of Unit ISSUED    Transfusion Status OK TO TRANSFUSE    Crossmatch Result      Compatible Performed at [facility], [place] [address], [place], [place] [address]   CBC     Status: Abnormal   Collection Time: 03/18/22 12:57 AM  Result Value Ref Range   WBC 8.1 4.0 - 10.5 K/uL   RBC 2.75 (L) 3.87 - 5.11 MIL/uL   Hemoglobin 7.8 (L) 12.0 - 15.0 g/dL   HCT 23.9 (L) 36.0 - 46.0 %   MCV 86.9 80.0 - 100.0 fL   MCH 28.4 26.0 - 34.0 pg   MCHC 32.6 30.0 - 36.0 g/dL   RDW 14.5 11.5 - 15.5 %   Platelets 305 150 - 400 K/uL   nRBC 0.0 0.0 - 0.2 %    Comment: Performed at [facility], [place] [address] [place], [place] [address]  Prepare RBC (crossmatch)     Status: None   Collection Time: 03/18/22  2:22 AM  Result Value Ref Range   Order Confirmation      ORDER PROCESSED BY BLOOD BANK Performed at [facility], [place] [address] [place], [place] [address]     DG Chest Port 1 View  Result Date: 03/17/2022 CLINICAL DATA:  Shortness of breath fever EXAM: PORTABLE CHEST 1 VIEW COMPARISON:  03/17/2022, 02/19/2020 FINDINGS: The heart size and mediastinal contours are within normal limits. Both lungs are clear. The visualized skeletal structures are unremarkable. IMPRESSION: No active disease. Electronically Signed   By: Donavan Foil M.D.   On: 03/17/2022 23:34   CT ABDOMEN PELVIS WO CONTRAST  Result Date: 03/17/2022 CLINICAL DATA:  Abdominal pain, nausea/vomiting, recent cholecystectomy EXAM: CT ABDOMEN AND PELVIS WITHOUT CONTRAST TECHNIQUE: Multidetector CT  imaging of the abdomen and pelvis was performed following the standard protocol without IV contrast. RADIATION DOSE REDUCTION: This exam was performed according to the departmental dose-optimization program which includes automated exposure control, adjustment of the mA and/or kV according to patient size and/or use of iterative reconstruction technique. COMPARISON:  Right upper quadrant ultrasound dated 02/21/2022 FINDINGS: Lower chest: Mild lingular scarring. Additional mild subpleural scarring/atelectasis in the lateral left lower lobe. Hepatobiliary: Large bilobed fluid collection likely arising from the gallbladder fossa and extending to the inferior/posterior aspect of the left hepatic lobe, measuring 11.4 x 25.3 x 12.2 cm (coronal image 48), suspicious for biloma in this patient status post recent cholecystectomy. Additional fluid collection/biloma anterior to the right hepatic lobe (series 2/image 30). Mass effect on the liver, which is otherwise unremarkable. Status post cholecystectomy. No intrahepatic or extrahepatic ductal dilatation. Pancreas: Within normal limits. Spleen: Within normal limits. Adrenals/Urinary Tract: Adrenal glands are within normal limits. 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 No renal calculi or hydronephrosis. Bladder is within normal limits. Stomach/Bowel: Mass effect by the dominant fluid collection in the stomach. No evidence of bowel obstruction. Normal appendix (series 2/image 81). No colonic wall thickening or inflammatory changes. Vascular/Lymphatic: No evidence of abdominal aortic aneurysm. Atherosclerotic calcifications of the abdominal aorta and branch vessels. No suspicious abdominopelvic lymphadenopathy. Reproductive: Uterus of normal limits. Bilateral ovaries are within normal limits. Other: Otherwise, no abdominopelvic ascites. Musculoskeletal: Degenerative changes of the visualized thoracolumbar spine.  IMPRESSION: Large bilobed fluid collection likely arising from the gallbladder fossa and extending to the inferior/posterior aspect of the left hepatic lobe, measuring up to 25.3 cm, suspicious for biloma in this patient status post recent cholecystectomy. Additional fluid collection/biloma anterior to the right hepatic lobe. Additional ancillary findings as above. These results were called by telephone at the time of interpretation on 03/17/2022 at 6:35 pm to provider [facility] , who verbally acknowledged these results. Electronically Signed   By: Julian Hy M.D.   On: 03/17/2022 18:40   DG Chest Port 1 View  Result Date: 03/17/2022 CLINICAL DATA:  Shortness of breath EXAM: PORTABLE CHEST 1 VIEW COMPARISON:  02/19/2020 FINDINGS: Lungs are clear.  No pleural effusion or pneumothorax. The heart is normal in size. Metallic device overlying the left neck (incompletely imaged) is likely external to the patient. IMPRESSION: No evidence of acute cardiopulmonary disease. Electronically Signed   By: Julian Hy M.D.   On: 03/17/2022 17:49    Review of Systems  Constitutional: Negative.   HENT: Negative.    Eyes: Negative.   Respiratory: Negative.    Cardiovascular: Negative.   Gastrointestinal:  Positive for abdominal pain, diarrhea, nausea and vomiting.  Endocrine: Negative.   Genitourinary: Negative.   Musculoskeletal: Negative.   Skin: Negative.   Allergic/Immunologic: Negative.   Neurological: Negative.   Hematological: Negative.   Psychiatric/Behavioral: Negative.     Blood pressure 106/71, pulse 95, temperature 98.6 F (37 C), temperature source Oral, resp. rate 18, height 5\' 3"  (1.6 m), weight 76.7 kg, SpO2 99 %. Physical Exam Constitutional:      General: She is not in acute distress.    Appearance: Normal appearance.  HENT:     Head: Normocephalic and atraumatic.     Right Ear: External ear normal.     Left Ear: External ear normal.     Nose: Nose normal.      Mouth/Throat:     Mouth: Mucous membranes are dry.     Pharynx: Oropharynx is clear.  Eyes:     General: No scleral icterus.    Extraocular Movements: Extraocular movements intact.     Conjunctiva/sclera: Conjunctivae normal.     Pupils: Pupils are equal, round, and reactive to light.  Cardiovascular:     Rate and Rhythm: Normal rate and regular rhythm.     Pulses: Normal pulses.     Heart sounds: Normal heart sounds. No murmur heard. Pulmonary:     Effort: Pulmonary effort is normal. No respiratory distress.     Breath sounds: Normal breath sounds.  Abdominal:     General: Abdomen is flat. Bowel sounds are normal.     Palpations: Abdomen is soft.     Tenderness: There is abdominal tenderness.     Comments: There is moderate central tenderness  Musculoskeletal:        General: No swelling or deformity. Normal range of motion.     Cervical back: Normal range of motion and neck supple. No tenderness.  Skin:    General: Skin is warm and dry.     Coloration: Skin is not jaundiced.  Neurological:     General: No focal deficit present.     Mental Status: She is alert and oriented to person, place, and time.  Psychiatric:        Mood and Affect: Mood normal.        Behavior: Behavior normal.        Thought Content: Thought content normal.     Assessment/Plan: The patient appears to have a large fluid collection associated with the liver that is likely associated with her laparoscopic cholecystectomy 3 weeks ago.  Although biloma is a possibility her liver functions are normal and her hemoglobin has dropped from 15-7.  Given these data points and her scan I feel it is possible that the fluid collection we are seeing is a hematoma.  It does seem to be compressing the stomach which would account for her nausea and vomiting.  I suspect that if this is drained percutaneously it may relieve some of the obstruction on the stomach.  I would transfuse as needed.  I would recommend consulting  interventional radiology and see the what their opinion is of this.  We will follow closely with you.  Autumn Messing III 03/18/2022, 10:19 AM

## 2022-03-18 NOTE — Assessment & Plan Note (Addendum)
Transfused 2 units.  Hgb stable since - Trend Hgb

## 2022-03-18 NOTE — Assessment & Plan Note (Signed)
No longer on daily opiates.

## 2022-03-19 ENCOUNTER — Encounter (HOSPITAL_COMMUNITY)
Admission: EM | Disposition: A | Discharge status: Home / Self Care | Payer: Self-pay | Source: Home / Self Care | Attending: Family Medicine

## 2022-03-19 ENCOUNTER — Encounter (HOSPITAL_COMMUNITY): Payer: Self-pay | Admitting: Family Medicine

## 2022-03-19 ENCOUNTER — Inpatient Hospital Stay (HOSPITAL_COMMUNITY): Payer: 59 | Admitting: Anesthesiology

## 2022-03-19 DIAGNOSIS — K2960 Other gastritis without bleeding: Secondary | ICD-10-CM | POA: Diagnosis not present

## 2022-03-19 DIAGNOSIS — F909 Attention-deficit hyperactivity disorder, unspecified type: Secondary | ICD-10-CM | POA: Diagnosis not present

## 2022-03-19 DIAGNOSIS — K2289 Other specified disease of esophagus: Secondary | ICD-10-CM

## 2022-03-19 DIAGNOSIS — E049 Nontoxic goiter, unspecified: Secondary | ICD-10-CM

## 2022-03-19 DIAGNOSIS — T8189XA Other complications of procedures, not elsewhere classified, initial encounter: Secondary | ICD-10-CM | POA: Diagnosis not present

## 2022-03-19 DIAGNOSIS — D62 Acute posthemorrhagic anemia: Secondary | ICD-10-CM

## 2022-03-19 DIAGNOSIS — K2091 Esophagitis, unspecified with bleeding: Secondary | ICD-10-CM

## 2022-03-19 DIAGNOSIS — N179 Acute kidney failure, unspecified: Secondary | ICD-10-CM | POA: Diagnosis not present

## 2022-03-19 HISTORY — PX: BIOPSY: SHX5522

## 2022-03-19 HISTORY — PX: ESOPHAGOGASTRODUODENOSCOPY (EGD) WITH PROPOFOL: SHX5813

## 2022-03-19 LAB — TYPE AND SCREEN
ABO/RH(D): A POS
ABO/RH(D): A POS
Antibody Screen: NEGATIVE
Antibody Screen: NEGATIVE
Unit division: 0
Unit division: 0

## 2022-03-19 LAB — CBC
HCT: 29.7 % — ABNORMAL LOW (ref 36.0–46.0)
Hemoglobin: 9.5 g/dL — ABNORMAL LOW (ref 12.0–15.0)
MCH: 27.9 pg (ref 26.0–34.0)
MCHC: 32 g/dL (ref 30.0–36.0)
MCV: 87.4 fL (ref 80.0–100.0)
Platelets: 347 10*3/uL (ref 150–400)
RBC: 3.4 MIL/uL — ABNORMAL LOW (ref 3.87–5.11)
RDW: 14.6 % (ref 11.5–15.5)
WBC: 7.1 10*3/uL (ref 4.0–10.5)
nRBC: 0 % (ref 0.0–0.2)

## 2022-03-19 LAB — COMPREHENSIVE METABOLIC PANEL
ALT: 25 U/L (ref 0–44)
AST: 23 U/L (ref 15–41)
Albumin: 2.1 g/dL — ABNORMAL LOW (ref 3.5–5.0)
Alkaline Phosphatase: 150 U/L — ABNORMAL HIGH (ref 38–126)
Anion gap: 7 (ref 5–15)
BUN: 65 mg/dL — ABNORMAL HIGH (ref 6–20)
CO2: 20 mmol/L — ABNORMAL LOW (ref 22–32)
Calcium: 8.4 mg/dL — ABNORMAL LOW (ref 8.9–10.3)
Chloride: 114 mmol/L — ABNORMAL HIGH (ref 98–111)
Creatinine, Ser: 2.13 mg/dL — ABNORMAL HIGH (ref 0.44–1.00)
GFR, Estimated: 28 mL/min — ABNORMAL LOW (ref >60)
Glucose, Bld: 94 mg/dL (ref 70–99)
Potassium: 4.5 mmol/L (ref 3.5–5.1)
Sodium: 141 mmol/L (ref 135–145)
Total Bilirubin: 0.5 mg/dL (ref 0.3–1.2)
Total Protein: 6.6 g/dL (ref 6.5–8.1)

## 2022-03-19 LAB — BPAM RBC
Blood Product Expiration Date: 202308232359
Blood Product Expiration Date: 202308242359
ISSUE DATE / TIME: 202307282004
ISSUE DATE / TIME: 202307290248
Unit Type and Rh: 6200
Unit Type and Rh: 6200

## 2022-03-19 SURGERY — ESOPHAGOGASTRODUODENOSCOPY (EGD) WITH PROPOFOL
Anesthesia: General

## 2022-03-19 MED ORDER — PROMETHAZINE HCL 25 MG/ML IJ SOLN: 6.2500 mg | INTRAMUSCULAR | Status: DC | PRN

## 2022-03-19 MED ORDER — LIDOCAINE 2% (20 MG/ML) 5 ML SYRINGE
INTRAMUSCULAR | Status: DC | PRN
  Administered 2022-03-19: 40 mg via INTRAVENOUS

## 2022-03-19 MED ORDER — DEXAMETHASONE SODIUM PHOSPHATE 10 MG/ML IJ SOLN
INTRAMUSCULAR | Status: DC | PRN
  Administered 2022-03-19: 5 mg via INTRAVENOUS

## 2022-03-19 MED ORDER — OXYCODONE HCL 5 MG/5ML PO SOLN: 5.0000 mg | Freq: Once | ORAL | Status: DC | PRN

## 2022-03-19 MED ORDER — PROPOFOL 10 MG/ML IV BOLUS
INTRAVENOUS | Status: DC | PRN
  Administered 2022-03-19: 200 mg via INTRAVENOUS

## 2022-03-19 MED ORDER — FENTANYL CITRATE (PF) 250 MCG/5ML IJ SOLN
INTRAMUSCULAR | Status: DC | PRN
  Administered 2022-03-19: 50 ug via INTRAVENOUS

## 2022-03-19 MED ORDER — OXYCODONE HCL 5 MG PO TABS: 5.0000 mg | ORAL_TABLET | Freq: Once | ORAL | Status: DC | PRN

## 2022-03-19 MED ORDER — SUCCINYLCHOLINE CHLORIDE 200 MG/10ML IV SOSY
PREFILLED_SYRINGE | INTRAVENOUS | Status: DC | PRN
  Administered 2022-03-19: 120 mg via INTRAVENOUS

## 2022-03-19 MED ORDER — ONDANSETRON HCL 4 MG/2ML IJ SOLN
INTRAMUSCULAR | Status: DC | PRN
  Administered 2022-03-19: 4 mg via INTRAVENOUS

## 2022-03-19 MED ORDER — SODIUM CHLORIDE 0.9 % IV SOLN: INTRAVENOUS | Status: DC | PRN

## 2022-03-19 MED ORDER — MEPERIDINE HCL 25 MG/ML IJ SOLN: 6.2500 mg | INTRAMUSCULAR | Status: DC | PRN

## 2022-03-19 MED ORDER — MIDAZOLAM HCL 2 MG/2ML IJ SOLN: 0.5000 mg | Freq: Once | INTRAMUSCULAR | Status: DC | PRN

## 2022-03-19 MED ORDER — OXYCODONE HCL 5 MG PO TABS
5.0000 mg | ORAL_TABLET | ORAL | Status: DC | PRN
  Administered 2022-03-19 – 2022-03-22 (×8): 5 mg via ORAL
  Filled 2022-03-19 (×10): qty 1

## 2022-03-19 MED ORDER — PROPOFOL 500 MG/50ML IV EMUL
INTRAVENOUS | Status: DC | PRN
  Administered 2022-03-19: 150 ug/kg/min via INTRAVENOUS

## 2022-03-19 MED ORDER — SODIUM CHLORIDE 0.9 % IV SOLN: INTRAVENOUS | Status: DC

## 2022-03-19 MED ORDER — MIDAZOLAM HCL 2 MG/2ML IJ SOLN
INTRAMUSCULAR | Status: DC | PRN
  Administered 2022-03-19: 2 mg via INTRAVENOUS

## 2022-03-19 MED ORDER — HYDROMORPHONE HCL 1 MG/ML IJ SOLN
0.2500 mg | INTRAMUSCULAR | Status: DC | PRN
  Administered 2022-03-19 (×2): 0.5 mg via INTRAVENOUS

## 2022-03-19 MED ORDER — HYDROMORPHONE HCL 1 MG/ML IJ SOLN
INTRAMUSCULAR | Status: AC
  Filled 2022-03-19: qty 1

## 2022-03-19 MED ORDER — FAMOTIDINE 20 MG PO TABS
20.0000 mg | ORAL_TABLET | Freq: Once | ORAL | Status: AC
  Administered 2022-03-19: 20 mg via ORAL
  Filled 2022-03-19: qty 1

## 2022-03-19 MED ORDER — SUCRALFATE 1 GM/10ML PO SUSP
1.0000 g | Freq: Three times a day (TID) | ORAL | Status: DC
  Administered 2022-03-19 – 2022-03-22 (×11): 1 g via ORAL
  Filled 2022-03-19 (×12): qty 10

## 2022-03-19 SURGICAL SUPPLY — 15 items

## 2022-03-19 NOTE — Anesthesia Postprocedure Evaluation (Signed)
Anesthesia Post Note  Patient: Margaret Yoder  Procedure(s) Performed: ESOPHAGOGASTRODUODENOSCOPY (EGD) WITH PROPOFOL BIOPSY     Patient location during evaluation: PACU Anesthesia Type: General Level of consciousness: awake and alert, patient cooperative and oriented Pain management: pain level controlled Vital Signs Assessment: post-procedure vital signs reviewed and stable Respiratory status: spontaneous breathing, nonlabored ventilation and respiratory function stable Cardiovascular status: blood pressure returned to baseline and stable Postop Assessment: no apparent nausea or vomiting Anesthetic complications: no   No notable events documented.  Last Vitals:  Vitals:   03/19/22 1017 03/19/22 1030  BP:  124/73  Pulse:  81  Resp:  20  Temp: 36.5 C   SpO2:  100%    Last Pain:  Vitals:   03/19/22 1030  TempSrc:   PainSc: 6                  Sarahjane Matherly,E. Anahi Belmar

## 2022-03-19 NOTE — Anesthesia Procedure Notes (Signed)
Procedure Name: Intubation Date/Time: 03/19/2022 9:46 AM  Performed by: Imagene Riches, CRNAPre-anesthesia Checklist: Patient identified, Emergency Drugs available, Suction available and Patient being monitored Patient Re-evaluated:Patient Re-evaluated prior to induction Oxygen Delivery Method: Circle System Utilized Preoxygenation: Pre-oxygenation with 100% oxygen Induction Type: IV induction, Rapid sequence and Cricoid Pressure applied Laryngoscope Size: Miller and 2 Grade View: Grade I Tube type: Oral Tube size: 7.0 mm Number of attempts: 1 Airway Equipment and Method: Stylet and Oral airway Placement Confirmation: ETT inserted through vocal cords under direct vision, positive ETCO2 and breath sounds checked- equal and bilateral Secured at: 21 cm Tube secured with: Tape Dental Injury: Teeth and Oropharynx as per pre-operative assessment

## 2022-03-19 NOTE — Progress Notes (Signed)
Subjective/Chief Complaint: Still complains of abd soreness. Less nausea since drain went in. Drain output bilious   Objective: Vital signs in last 24 hours: Temp:  [97.9 F (36.6 C)-98.1 F (36.7 C)] 98 F (36.7 C) (07/30 0428) Pulse Rate:  [87-94] 93 (07/30 0428) Resp:  [16-32] 16 (07/30 0428) BP: (95-138)/(54-79) 138/61 (07/30 0428) SpO2:  [98 %-100 %] 100 % (07/30 0428) Last BM Date : 03/18/22  Intake/Output from previous day: 07/29 0701 - 07/30 0700 In: 602.8 [I.V.:502.8; IV Piggyback:100] Out: 1050 [Drains:150] Intake/Output this shift: No intake/output data recorded.  General appearance: alert and cooperative Resp: clear to auscultation bilaterally Cardio: regular rate and rhythm GI: soft, moderate tenderness  Lab Results:  Recent Labs    03/18/22 0057 03/18/22 1122 03/19/22 0150  WBC 8.1  --  7.1  HGB 7.8* 9.7* 9.5*  HCT 23.9* 31.9* 29.7*  PLT 305  --  347   BMET Recent Labs    03/18/22 0057 03/19/22 0150  NA 139 141  K 4.5 4.5  CL 111 114*  CO2 19* 20*  GLUCOSE 108* 94  BUN 97* 65*  CREATININE 2.56* 2.13*  CALCIUM 8.3* 8.4*   PT/INR Recent Labs    03/18/22 0057  LABPROT 16.3*  INR 1.3*   ABG No results for input(s): "PHART", "HCO3" in the last 72 hours.  Invalid input(s): "PCO2", "PO2"  Studies/Results: IR US Guide Bx Asp/Drain  Result Date: 03/18/2022 INDICATION: 46 year old woman with abdominal pain, nausea, vomiting, and upper abdominal fluid collection status post cholecystectomy presents to IR for ultrasound-guided drain placement. EXAM: 1. Ultrasound-guided upper abdominal abscess drain placement 2. Ultrasound-guided aspiration of right upper quadrant collection. MEDICATIONS: Ancef 2 g IV ANESTHESIA/SEDATION: Fentanyl 100 mcg IV; Versed 2 mg IV Moderate Sedation Time:  11 minutes The patient was continuously monitored during the procedure by the interventional radiology nurse under my direct supervision. COMPLICATIONS: None  immediate. PROCEDURE: Informed written consent was obtained from the patient after a thorough discussion of the procedural risks, benefits and alternatives. All questions were addressed. Maximal Sterile Barrier Technique was utilized including caps, mask, sterile gowns, sterile gloves, sterile drape, hand hygiene and skin antiseptic. A timeout was performed prior to the initiation of the procedure. Epigastric and right upper quadrant abdominal wall skin prepped and draped in usual fashion. Following local lidocaine administration, the mid abdominal fluid collection was accessed with a 19 gauge Yueh needle using continuous ultrasound guidance. Yueh catheter exchanged for 10.2 [place] multipurpose pigtail drain over 0.035 inch guidewire. 900 mL of brown cloudy fluid aspirated. Samples were sent for Gram stain and culture. Drain secured to skin with suture and connected to bulb suction. Following the administration, the lenticular fluid collection in the right upper quadrant was accessed with 19 gauge Yueh needle using continuous ultrasound guidance, however nothing could be aspirated. IMPRESSION: 1. 10.2 French multipurpose pigtail drain inserted in abdominal fluid collection. 900 mL cloudy brown fluid aspirated. Sample sent for Gram stain and culture. 2. Lingular collection in the right upper quadrant accessed with ultrasound guidance, however no fluid could be aspirated. This is likely a hematoma which has not yet liquified. Electronically Signed   By: Miachel Roux M.D.   On: 03/18/2022 13:00   IR US Guide Bx Asp/Drain  Result Date: 03/18/2022 INDICATION: 46 year old woman with abdominal pain, nausea, vomiting, and upper abdominal fluid collection status post cholecystectomy presents to IR for ultrasound-guided drain placement. EXAM: 1. Ultrasound-guided upper abdominal abscess drain placement 2. Ultrasound-guided aspiration of right upper quadrant  collection. MEDICATIONS: Ancef 2 g IV ANESTHESIA/SEDATION:  Fentanyl 100 mcg IV; Versed 2 mg IV Moderate Sedation Time:  11 minutes The patient was continuously monitored during the procedure by the interventional radiology nurse under my direct supervision. COMPLICATIONS: None immediate. PROCEDURE: Informed written consent was obtained from the patient after a thorough discussion of the procedural risks, benefits and alternatives. All questions were addressed. Maximal Sterile Barrier Technique was utilized including caps, mask, sterile gowns, sterile gloves, sterile drape, hand hygiene and skin antiseptic. A timeout was performed prior to the initiation of the procedure. Epigastric and right upper quadrant abdominal wall skin prepped and draped in usual fashion. Following local lidocaine administration, the mid abdominal fluid collection was accessed with a 19 gauge Yueh needle using continuous ultrasound guidance. Yueh catheter exchanged for 10.2 [place] multipurpose pigtail drain over 0.035 inch guidewire. 900 mL of brown cloudy fluid aspirated. Samples were sent for Gram stain and culture. Drain secured to skin with suture and connected to bulb suction. Following the administration, the lenticular fluid collection in the right upper quadrant was accessed with 19 gauge Yueh needle using continuous ultrasound guidance, however nothing could be aspirated. IMPRESSION: 1. 10.2 French multipurpose pigtail drain inserted in abdominal fluid collection. 900 mL cloudy brown fluid aspirated. Sample sent for Gram stain and culture. 2. Lingular collection in the right upper quadrant accessed with ultrasound guidance, however no fluid could be aspirated. This is likely a hematoma which has not yet liquified. Electronically Signed   By: Miachel Roux M.D.   On: 03/18/2022 13:00   DG Chest Port 1 View  Result Date: 03/17/2022 CLINICAL DATA:  Shortness of breath fever EXAM: PORTABLE CHEST 1 VIEW COMPARISON:  03/17/2022, 02/19/2020 FINDINGS: The heart size and mediastinal contours are  within normal limits. Both lungs are clear. The visualized skeletal structures are unremarkable. IMPRESSION: No active disease. Electronically Signed   By: Donavan Foil M.D.   On: 03/17/2022 23:34   CT ABDOMEN PELVIS WO CONTRAST  Result Date: 03/17/2022 CLINICAL DATA:  Abdominal pain, nausea/vomiting, recent cholecystectomy EXAM: CT ABDOMEN AND PELVIS WITHOUT CONTRAST TECHNIQUE: Multidetector CT imaging of the abdomen and pelvis was performed following the standard protocol without IV contrast. RADIATION DOSE REDUCTION: This exam was performed according to the departmental dose-optimization program which includes automated exposure control, adjustment of the mA and/or kV according to patient size and/or use of iterative reconstruction technique. COMPARISON:  Right upper quadrant ultrasound dated 02/21/2022 FINDINGS: Lower chest: Mild lingular scarring. Additional mild subpleural scarring/atelectasis in the lateral left lower lobe. Hepatobiliary: Large bilobed fluid collection likely arising from the gallbladder fossa and extending to the inferior/posterior aspect of the left hepatic lobe, measuring 11.4 x 25.3 x 12.2 cm (coronal image 48), suspicious for biloma in this patient status post recent cholecystectomy. Additional fluid collection/biloma anterior to the right hepatic lobe (series 2/image 30). Mass effect on the liver, which is otherwise unremarkable. Status post cholecystectomy. No intrahepatic or extrahepatic ductal dilatation. Pancreas: Within normal limits. Spleen: Within normal limits. Adrenals/Urinary Tract: Adrenal glands are within normal limits. 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 No renal calculi or hydronephrosis. Bladder is within normal limits. Stomach/Bowel: Mass effect by the dominant fluid collection in the stomach. No evidence of bowel obstruction. Normal appendix (series 2/image 81). No colonic wall thickening or  inflammatory changes. Vascular/Lymphatic: No evidence of abdominal aortic aneurysm. Atherosclerotic calcifications of the abdominal aorta and branch vessels. No suspicious abdominopelvic lymphadenopathy. Reproductive: Uterus  of normal limits. Bilateral ovaries are within normal limits. Other: Otherwise, no abdominopelvic ascites. Musculoskeletal: Degenerative changes of the visualized thoracolumbar spine. IMPRESSION: Large bilobed fluid collection likely arising from the gallbladder fossa and extending to the inferior/posterior aspect of the left hepatic lobe, measuring up to 25.3 cm, suspicious for biloma in this patient status post recent cholecystectomy. Additional fluid collection/biloma anterior to the right hepatic lobe. Additional ancillary findings as above. These results were called by telephone at the time of interpretation on 03/17/2022 at 6:35 pm to provider [facility] , who verbally acknowledged these results. Electronically Signed   By: Julian Hy M.D.   On: 03/17/2022 18:40   DG Chest Port 1 View  Result Date: 03/17/2022 CLINICAL DATA:  Shortness of breath EXAM: PORTABLE CHEST 1 VIEW COMPARISON:  02/19/2020 FINDINGS: Lungs are clear.  No pleural effusion or pneumothorax. The heart is normal in size. Metallic device overlying the left neck (incompletely imaged) is likely external to the patient. IMPRESSION: No evidence of acute cardiopulmonary disease. Electronically Signed   By: Julian Hy M.D.   On: 03/17/2022 17:49    Anti-infectives: Anti-infectives (From admission, onward)    Start     Dose/Rate Route Frequency Ordered Stop   03/18/22 1245  ceFAZolin (ANCEF) IVPB 2g/100 mL premix        2 g 200 mL/hr over 30 Minutes Intravenous  Once 03/18/22 1158 03/18/22 1245       Assessment/Plan: s/p Procedure(s): ESOPHAGOGASTRODUODENOSCOPY (EGD) WITH PROPOFOL (N/A) Biloma after lap chole 3 weeks ago Continue drain HIDA and possible ERCP today Anemia improving Will  follow  LOS: 2 days    Autumn Messing III 03/19/2022

## 2022-03-19 NOTE — Assessment & Plan Note (Signed)
This was noted incidentally by anesthesia during EGD. - Check thyroid function studies

## 2022-03-19 NOTE — Interval H&P Note (Signed)
History and Physical Interval Note:  03/19/2022 8:59 AM  Margaret Yoder  has presented today for surgery, with the diagnosis of GI bleeding.  The various methods of treatment have been discussed with the patient and family. After consideration of risks, benefits and other options for treatment, the patient has consented to  Procedure(s): ESOPHAGOGASTRODUODENOSCOPY (EGD) WITH PROPOFOL (N/A) as a surgical intervention.  The patient's history has been reviewed, patient examined, no change in status, stable for surgery.  I have reviewed the patient's chart and labs.  Questions were answered to the patient's satisfaction.     [employer]

## 2022-03-19 NOTE — Transfer of Care (Signed)
Immediate Anesthesia Transfer of Care Note  Patient: Margaret Yoder  Procedure(s) Performed: ESOPHAGOGASTRODUODENOSCOPY (EGD) WITH PROPOFOL BIOPSY  Patient Location: PACU  Anesthesia Type:General  Level of Consciousness: drowsy  Airway & Oxygen Therapy: Patient Spontanous Breathing and Patient connected to nasal cannula oxygen  Post-op Assessment: Report given to RN and Post -op Vital signs reviewed and stable  Post vital signs: Reviewed and stable  Last Vitals:  Vitals Value Taken Time  BP 131/69 03/19/22 1016  Temp    Pulse 83 03/19/22 1022  Resp 21 03/19/22 1023  SpO2 82 % 03/19/22 1022  Vitals shown include unvalidated device data.  Last Pain:  Vitals:   03/19/22 0854  TempSrc: Temporal  PainSc: 10-Worst pain ever         Complications: No notable events documented.

## 2022-03-19 NOTE — Progress Notes (Signed)
Pacu RN Report to floor given  Gave report to [employer]. Room: [id number]   Discussed surgery, meds given in OR and Pacu, VS, IV fluids given, EBL, urine output, pain and other pertinent information. Also discussed if pt had any family or friends here or belongings with them.   Discussed the Endo Dr's findings. Gave Dilaudid for pain, working well. Pt will go for Hida scan later today. Can have sips until Hida scan done.   Pt exits my care.

## 2022-03-19 NOTE — Plan of Care (Signed)
  Problem: Education: Goal: Knowledge of General Education information will improve Description Including pain rating scale, medication(s)/side effects and non-pharmacologic comfort measures Outcome: Progressing   Problem: Health Behavior/Discharge Planning: Goal: Ability to manage health-related needs will improve Outcome: Progressing   

## 2022-03-19 NOTE — Progress Notes (Signed)
  Progress Note   Patient: Margaret Yoder NOI:[id number] DOB: 09/11/1975 DOA: 03/17/2022     2 DOS: the patient was seen and examined on 03/19/2022        Brief hospital course: Margaret Yoder is a 46 y.o. F with chronic pain, anxiety and ADHD on daily Vicodin, Klonopin and Adderall, HTN, smoking, CKD IV baseline Cr 1.9-2.1 and recent cholecystectomy who presented with recurrent RUQ pain.  Had recent uncomplicated cholecystectomy.  After discharge, developed progressively worsening RUQ pain, then vomiting, then "dark stools" then dizziness so she returned to the ER.       Assessment and Plan: * Biloma following surgery, initial encounter Patient admitted and had percutaneous drain placed 7/29, culture no growth to date.  EGD today, showed esophagitis, numerous gastric erosions, no mass.  Extrinsic compression of the duodenal lumen could be appreciated, and bowel coming from the ampulla.  - Obtain HIDA scan, still pending, evidently there was an error in [facility] scheduling and they did not process the order today. - Diet full liquid - Follow perc drain culture   Acute kidney injury superimposed on chronic kidney disease (HCC) Baseline Cr 1.9-2.1, presented with Cr 2.7.   Currently improved to baseline with fluids - Avoid nephrotoxins - Stop fluids  Melena Clinically reported melena.  EGD showed gastric erosions. - Continue IV PPI for 48 hours, then transition to PO - Consult GI, appreciate cares  Possible goiter This was noted incidentally by anesthesia during EGD. - Check thyroid function studies  Anxiety No longer on daily benzodiazepines.  ADHD No longer on Adderall.  Chronic pain syndrome No longer on daily opiates.   Acute blood loss anemia Transfused 2 units.  Hgb stable since - Trend Hgb  CKD (chronic kidney disease), stage IV (HCC) Baseline in last 6 months appaers to be in the 1.9-2.1 range.  CKD III ruled out, this would be CKD IV.  Essential  hypertension BP soft - Continue carvedilol - Hold amlodipine          Subjective: Still some abdominal pain, still some pain around her PEG tube, no vomiting, no confusion, no jaundice.     Physical Exam: Vitals:   03/19/22 1017 03/19/22 1030 03/19/22 1136 03/19/22 1539  BP:  124/73 140/90 (!) 127/100  Pulse:  81 88 90  Resp:  20 16 17   Temp: 97.7 F (36.5 C)  98 F (36.7 C) 97.7 F (36.5 C)  TempSrc:   Oral Oral  SpO2:  100% 100% 100%  Weight:      Height:       Adult female, lying in bed, no acute distress, appears weak and tired RRR, no murmurs, no peripheral edema Respiratory rate normal, lungs clear without rales or wheezes Abdomen with diffuse tenderness, voluntary guarding, no rigidity rebound or distention Percutaneous drain in place Attention normal, affect appropriate, judgment and insight appear normal  Data Reviewed: Basic metabolic panel shows creatinine down to 2.1, LFTs normal Hemogram shows hemoglobin stable at 9.5, white count and platelets normal      Disposition: Status is: Inpatient         Author: Edwin Dada, MD 03/19/2022 4:10 PM  For on call review [email or web address].

## 2022-03-19 NOTE — Op Note (Signed)
[facility] Patient Name: Margaret Yoder Procedure Date : 03/19/2022 MRN: [id number] Attending MD: Justice Britain , MD Date of Birth: Feb 03, 1976 CSN: [id number] Age: 46 Admit Type: Inpatient Procedure:                Upper GI endoscopy Indications:              Acute post hemorrhagic anemia, Heartburn, Melena,                            Occult blood in stool, Recent gastrointestinal                            bleeding Providers:                Justice Britain, MD, Jeanella Cara, RN,                            Despina Pole, Technician Referring MD:             [facility] Medicines:                General Anesthesia Complications:            No immediate complications. Estimated Blood Loss:     Estimated blood loss was minimal. Procedure:                Pre-Anesthesia Assessment:                           - Prior to the procedure, a History and Physical                            was performed, and patient medications and                            allergies were reviewed. The patient's tolerance of                            previous anesthesia was also reviewed. The risks                            and benefits of the procedure and the sedation                            options and risks were discussed with the patient.                            All questions were answered, and informed consent                            was obtained. Prior Anticoagulants: The patient has                            taken no previous anticoagulant or antiplatelet                            agents. ASA Grade Assessment:  III - A patient with                            severe systemic disease. After reviewing the risks                            and benefits, the patient was deemed in                            satisfactory condition to undergo the procedure.                           After obtaining informed consent, the endoscope was                             passed under direct vision. Throughout the                            procedure, the patient's blood pressure, pulse, and                            oxygen saturations were monitored continuously. The                            [id number]-[id number] ([id number]) Olympus endoscope was introduced                            through the mouth, and advanced to the second part                            of duodenum. The [id number]-[id number] ([id number]) Olympus                            duodenoscope was introduced through the mouth, and                            advanced to the area of papilla. The upper GI                            endoscopy was accomplished without difficulty. The                            patient tolerated the procedure. Scope In: Scope Out: Findings:      No gross lesions were noted in the proximal esophagus and in the mid       esophagus.      LA Grade D (one or more mucosal breaks involving at least 75% of       esophageal circumference) esophagitis with bleeding was found in the       distal esophagus (34 to 40 cm from the incisors).      Two tongues of salmon-colored mucosa were present from 39 to 40 cm. No       other visible abnormalities were present. Biopsies were taken with a       cold forceps for histology to rule  out/in Barrett's.      The Z-line was irregular and was found 40 cm from the incisors.      An extrinsic impression deformity was found in the gastric body/gastric       antrum.      Diffuse severe inflammation characterized by erosions, friability,       granularity, linear erosions and shallow ulcerations were found in the       entire examined stomach. Biopsies were taken with a cold forceps for       histology and Helicobacter pylori testing.      Diffuse mildly erythematous mucosa without active bleeding and with no       stigmata of bleeding was found in the duodenal bulb.      No other gross lesions were noted in the first portion of the duodenum       and in the  second portion of the duodenum.      A bulbous major papilla was noted with bile draining. Impression:               - No gross lesions in esophagus proximally. LA                            Grade D esophagitis with bleeding.                           - Salmon-colored mucosa suspicious for Barrett's                            esophagus - distally. Biopsied.                           - Z-line irregular, 40 cm from the incisors.                           - Extrinsic impression deformity in the gastric                            body/antrum.                           - Erosive gastritis. Biopsied.                           - Erythematous duodenopathy in bulb. No other gross                            lesions in the first portion of the duodenum and in                            the second portion of the duodenum.                           - Major papilla is bulbous. Recommendation:           - The patient will be observed post-procedure,                            until all discharge  criteria are met.                           - Return patient to hospital ward for ongoing care.                           - NPO until HIDA.                           - Floor team RN to send fluid from IR Percutaneous                            drain for Bilirubin (order in place).                           - Continue IV PPI BID x 48 hours and then may                            transition to PO PPI BID.                           - Observe patient's clinical course.                           - Await pathology results.                           - Repeat upper endoscopy in 4 months to check                            healing of gastric ulcers/esophagitis.                           - Pending patient's clinical course, Percutaneous                            drainage output, and HIDA scan will prepare for                            possible ERCP this week if necessary.                           - Patient noted by  Anesthesia to have what appears                            to be an enlarged goiter/thyroid. Will plan full                            set of TFTs, though understandable that there could                            be some sick Euthyroid that could be found or  abnormalities due to her acute illness.                           - The findings and recommendations were discussed                            with the patient.                           - The findings and recommendations were discussed                            with the patient's family.                           - The findings and recommendations were discussed                            with the referring physician. Procedure Code(s):        --- Professional ---                           [phone], Esophagogastroduodenoscopy, flexible,                            transoral; with biopsy, single or multiple Diagnosis Code(s):        --- Professional ---                           K22.8, Other specified diseases of esophagus                           K20.91, Esophagitis, unspecified with bleeding                           K29.60, Other gastritis without bleeding                           K31.89, Other diseases of stomach and duodenum                           D62, Acute posthemorrhagic anemia                           R12, Heartburn                           K92.1, Melena (includes Hematochezia)                           R19.5, Other fecal abnormalities                           K92.2, Gastrointestinal hemorrhage, unspecified CPT copyright 2019 [employer]. All rights reserved. The codes documented in this report are preliminary and upon coder review may  be revised to meet current compliance requirements. Margaret Parish, MD 03/19/2022 10:24:01 AM Number of Addenda: 0

## 2022-03-20 ENCOUNTER — Inpatient Hospital Stay (HOSPITAL_COMMUNITY): Payer: 59

## 2022-03-20 DIAGNOSIS — F909 Attention-deficit hyperactivity disorder, unspecified type: Secondary | ICD-10-CM | POA: Diagnosis not present

## 2022-03-20 DIAGNOSIS — R933 Abnormal findings on diagnostic imaging of other parts of digestive tract: Secondary | ICD-10-CM

## 2022-03-20 DIAGNOSIS — D62 Acute posthemorrhagic anemia: Secondary | ICD-10-CM | POA: Diagnosis not present

## 2022-03-20 DIAGNOSIS — K838 Other specified diseases of biliary tract: Secondary | ICD-10-CM

## 2022-03-20 DIAGNOSIS — N179 Acute kidney failure, unspecified: Secondary | ICD-10-CM | POA: Diagnosis not present

## 2022-03-20 DIAGNOSIS — T8189XA Other complications of procedures, not elsewhere classified, initial encounter: Secondary | ICD-10-CM | POA: Diagnosis not present

## 2022-03-20 DIAGNOSIS — K259 Gastric ulcer, unspecified as acute or chronic, without hemorrhage or perforation: Secondary | ICD-10-CM

## 2022-03-20 LAB — CBC
HCT: 26.8 % — ABNORMAL LOW (ref 36.0–46.0)
Hemoglobin: 8.8 g/dL — ABNORMAL LOW (ref 12.0–15.0)
MCH: 28.7 pg (ref 26.0–34.0)
MCHC: 32.8 g/dL (ref 30.0–36.0)
MCV: 87.3 fL (ref 80.0–100.0)
Platelets: 348 10*3/uL (ref 150–400)
RBC: 3.07 MIL/uL — ABNORMAL LOW (ref 3.87–5.11)
RDW: 14.9 % (ref 11.5–15.5)
WBC: 5.9 10*3/uL (ref 4.0–10.5)
nRBC: 0 % (ref 0.0–0.2)

## 2022-03-20 LAB — BASIC METABOLIC PANEL
Anion gap: 8 (ref 5–15)
BUN: 42 mg/dL — ABNORMAL HIGH (ref 6–20)
CO2: 19 mmol/L — ABNORMAL LOW (ref 22–32)
Calcium: 8.8 mg/dL — ABNORMAL LOW (ref 8.9–10.3)
Chloride: 115 mmol/L — ABNORMAL HIGH (ref 98–111)
Creatinine, Ser: 1.89 mg/dL — ABNORMAL HIGH (ref 0.44–1.00)
GFR, Estimated: 33 mL/min — ABNORMAL LOW (ref >60)
Glucose, Bld: 112 mg/dL — ABNORMAL HIGH (ref 70–99)
Potassium: 4.8 mmol/L (ref 3.5–5.1)
Sodium: 142 mmol/L (ref 135–145)

## 2022-03-20 LAB — T4, FREE: Free T4: 1.18 ng/dL — ABNORMAL HIGH (ref 0.61–1.12)

## 2022-03-20 LAB — TSH: TSH: 0.378 u[IU]/mL (ref 0.350–4.500)

## 2022-03-20 MED ORDER — TECHNETIUM TC 99M MEBROFENIN IV KIT
5.3000 | PACK | Freq: Once | INTRAVENOUS | Status: AC | PRN
  Administered 2022-03-20: 5.3 via INTRAVENOUS

## 2022-03-20 NOTE — Plan of Care (Signed)
  Problem: Education: Goal: Knowledge of General Education information will improve Description Including pain rating scale, medication(s)/side effects and non-pharmacologic comfort measures Outcome: Progressing   Problem: Health Behavior/Discharge Planning: Goal: Ability to manage health-related needs will improve Outcome: Progressing   

## 2022-03-20 NOTE — Progress Notes (Signed)
Progress Note   Patient: Margaret Yoder WNI:[id number] DOB: [birth year] DOA: 03/17/2022     3 DOS: the patient was seen and examined on 03/20/2022       Brief hospital course: Mrs. Boyett is a 46 y.o. F with chronic pain, anxiety and ADHD on daily Vicodin, Klonopin and Adderall, HTN, smoking, CKD IV baseline Cr 1.9-2.1 and recent cholecystectomy who presented with recurrent RUQ pain.  Had recent uncomplicated cholecystectomy.  After discharge, developed progressively worsening RUQ pain, then vomiting, then "dark stools" then dizziness so she returned to the ER.   7/28: Admitted 729: Perc drain placed 7/30 EGD showed gastritis 7/31 HIDA planned      Assessment and Plan: * Biloma following surgery, initial encounter Patient admitted and had percutaneous drain placed 7/29, culture still no growth   EGD 7/30, showed esophagitis, gastritis and duodenitis without ulcer or mass.  Extrinsic compression of the duodenal lumen could be appreciated, and bile coming from the ampulla. HIDA delayed over the weekend Stable off Abx - Obtain HIDA scan  - Diet per GI - Follow perc drain culture   Acute kidney injury superimposed on chronic kidney disease (HCC) Baseline Cr 1.9-2.1, presented with Cr 2.7.   Currently improved to baseline with fluids - Avoid nephrotoxins   Melena Clinically reported melena.  EGD showed gastric erosions no ulcers - Continue IV PPI until tomorrow then transition to PO BID - Consult GI, appreciate cares  Possible goiter During EGD, anesthesia perceived enlargement of the thyroid.  Not tried appreciate this, certainly there is no discrete nodule or asymmetry.  TSH normal, fT4 minimally elevated, doubt this is significant, T3 pending.   - Given normal TFTs, would probably recommend outpatient thyroid ultrasound - Given her acute illness would probably also repeat TFTs in about 1 month   Anxiety No longer on daily benzodiazepines.  ADHD No longer on  Adderall.  Chronic pain syndrome No longer on daily opiates.   Acute blood loss anemia Transfused 2 units.   Hgb remains stable - Trend Hgb  CKD (chronic kidney disease), stage IV (HCC) Baseline in last 6 months appaers to be in the 1.9-2.1 range.  CKD III ruled out, this would be CKD IV.  Essential hypertension BP normal - Continue carvedilol - Hold amlodipine          Subjective: Patient feels fine, she still has abdominal pain, she is hungry, she had no fever, confusion, vomiting, respiratory symptoms.  No palpitations, tremor     Physical Exam: Vitals:   03/19/22 1539 03/19/22 2040 03/20/22 0414 03/20/22 0714  BP: (!) 127/100 130/81 (!) 141/79 (!) 160/80  Pulse: 90 80 83 66  Resp: 17 18 18 16   Temp: 97.7 F (36.5 C) 98.1 F (36.7 C) 97.9 F (36.6 C) 97.8 F (36.6 C)  TempSrc: Oral Oral Oral Oral  SpO2: 100% 99% 100% 100%  Weight:      Height:       Adult female, lying in bed, no acute distress RRR, no murmurs, no peripheral edema Respiratory rate normal, lungs clear without rales or wheezes Abdomen diffusely tender, voluntary guarding noted, no rigidity, no rebound, no distention, percutaneous drain is in place with scant cloudy fluid Attention normal, affect appropriate, judgment and insight appear normal  Data Reviewed: TSH normal, free T4 minimally elevated Basic metabolic panel shows creatinine improved to 1.9 Hemogram shows mild anemia, no change from previous, white blood cells and platelets normal      Disposition: Status is: Inpatient The  patient was admitted for intra-abdominal fluid collection after cholecystectomy.  Suspect this is a biloma at this point, she is doing well off of antibiotics.  We have a HIDA scan pending today and GI are planning to decide if they need ERCP for stenting or not        Author: Edwin Dada, MD 03/20/2022 8:59 AM  For on call review [email or web address].

## 2022-03-20 NOTE — Progress Notes (Signed)
1 Day Post-Op  Subjective: CC: Pain better since IR drain. Still some pain in RUQ and nausea.   Objective: Vital signs in last 24 hours: Temp:  [97.7 F (36.5 C)-98.1 F (36.7 C)] 97.8 F (36.6 C) (07/31 0714) Pulse Rate:  [66-90] 66 (07/31 0714) Resp:  [16-20] 16 (07/31 0714) BP: (124-160)/(73-100) 160/80 (07/31 0714) SpO2:  [99 %-100 %] 100 % (07/31 0714) Last BM Date : 03/19/22  Intake/Output from previous day: 07/30 0701 - 07/31 0700 In: 505 [I.V.:500] Out: 230 [Drains:230] Intake/Output this shift: Total I/O In: -  Out: 40 [Drains:40]  PE: Gen:  Alert, NAD, pleasant Abd: Soft, ND, generalized tenderness greatest in epigsatrium and ruq, +BS, IR drain bilious   Lab Results:  Recent Labs    03/19/22 0150 03/20/22 0117  WBC 7.1 5.9  HGB 9.5* 8.8*  HCT 29.7* 26.8*  PLT 347 348   BMET Recent Labs    03/19/22 0150 03/20/22 0117  NA 141 142  K 4.5 4.8  CL 114* 115*  CO2 20* 19*  GLUCOSE 94 112*  BUN 65* 42*  CREATININE 2.13* 1.89*  CALCIUM 8.4* 8.8*   PT/INR Recent Labs    03/18/22 0057  LABPROT 16.3*  INR 1.3*   CMP     Component Value Date/Time   NA 142 03/20/2022 0117   K 4.8 03/20/2022 0117   CL 115 (H) 03/20/2022 0117   CO2 19 (L) 03/20/2022 0117   GLUCOSE 112 (H) 03/20/2022 0117   BUN 42 (H) 03/20/2022 0117   CREATININE 1.89 (H) 03/20/2022 0117   CALCIUM 8.8 (L) 03/20/2022 0117   PROT 6.6 03/19/2022 0150   ALBUMIN 2.1 (L) 03/19/2022 0150   AST 23 03/19/2022 0150   ALT 25 03/19/2022 0150   ALKPHOS 150 (H) 03/19/2022 0150   BILITOT 0.5 03/19/2022 0150   GFRNONAA 33 (L) 03/20/2022 0117   GFRAA 23 (L) 02/19/2020 1210   Lipase     Component Value Date/Time   LIPASE 65 (H) 03/17/2022 1611    Studies/Results: IR US Guide Bx Asp/Drain  Result Date: 03/18/2022 INDICATION: 46 year old woman with abdominal pain, nausea, vomiting, and upper abdominal fluid collection status post cholecystectomy presents to IR for ultrasound-guided  drain placement. EXAM: 1. Ultrasound-guided upper abdominal abscess drain placement 2. Ultrasound-guided aspiration of right upper quadrant collection. MEDICATIONS: Ancef 2 g IV ANESTHESIA/SEDATION: Fentanyl 100 mcg IV; Versed 2 mg IV Moderate Sedation Time:  11 minutes The patient was continuously monitored during the procedure by the interventional radiology nurse under my direct supervision. COMPLICATIONS: None immediate. PROCEDURE: Informed written consent was obtained from the patient after a thorough discussion of the procedural risks, benefits and alternatives. All questions were addressed. Maximal Sterile Barrier Technique was utilized including caps, mask, sterile gowns, sterile gloves, sterile drape, hand hygiene and skin antiseptic. A timeout was performed prior to the initiation of the procedure. Epigastric and right upper quadrant abdominal wall skin prepped and draped in usual fashion. Following local lidocaine administration, the mid abdominal fluid collection was accessed with a 19 gauge Yueh needle using continuous ultrasound guidance. Yueh catheter exchanged for 10.2 [place] multipurpose pigtail drain over 0.035 inch guidewire. 900 mL of brown cloudy fluid aspirated. Samples were sent for Gram stain and culture. Drain secured to skin with suture and connected to bulb suction. Following the administration, the lenticular fluid collection in the right upper quadrant was accessed with 19 gauge Yueh needle using continuous ultrasound guidance, however nothing could be aspirated. IMPRESSION: 1. 10.2  [place] multipurpose pigtail drain inserted in abdominal fluid collection. 900 mL cloudy brown fluid aspirated. Sample sent for Gram stain and culture. 2. Lingular collection in the right upper quadrant accessed with ultrasound guidance, however no fluid could be aspirated. This is likely a hematoma which has not yet liquified. Electronically Signed   By: Miachel Roux M.D.   On: 03/18/2022 13:00   IR US  Guide Bx Asp/Drain  Result Date: 03/18/2022 INDICATION: 46 year old woman with abdominal pain, nausea, vomiting, and upper abdominal fluid collection status post cholecystectomy presents to IR for ultrasound-guided drain placement. EXAM: 1. Ultrasound-guided upper abdominal abscess drain placement 2. Ultrasound-guided aspiration of right upper quadrant collection. MEDICATIONS: Ancef 2 g IV ANESTHESIA/SEDATION: Fentanyl 100 mcg IV; Versed 2 mg IV Moderate Sedation Time:  11 minutes The patient was continuously monitored during the procedure by the interventional radiology nurse under my direct supervision. COMPLICATIONS: None immediate. PROCEDURE: Informed written consent was obtained from the patient after a thorough discussion of the procedural risks, benefits and alternatives. All questions were addressed. Maximal Sterile Barrier Technique was utilized including caps, mask, sterile gowns, sterile gloves, sterile drape, hand hygiene and skin antiseptic. A timeout was performed prior to the initiation of the procedure. Epigastric and right upper quadrant abdominal wall skin prepped and draped in usual fashion. Following local lidocaine administration, the mid abdominal fluid collection was accessed with a 19 gauge Yueh needle using continuous ultrasound guidance. Yueh catheter exchanged for 10.2 [place] multipurpose pigtail drain over 0.035 inch guidewire. 900 mL of brown cloudy fluid aspirated. Samples were sent for Gram stain and culture. Drain secured to skin with suture and connected to bulb suction. Following the administration, the lenticular fluid collection in the right upper quadrant was accessed with 19 gauge Yueh needle using continuous ultrasound guidance, however nothing could be aspirated. IMPRESSION: 1. 10.2 [place] multipurpose pigtail drain inserted in abdominal fluid collection. 900 mL cloudy brown fluid aspirated. Sample sent for Gram stain and culture. 2. Lingular collection in the right upper  quadrant accessed with ultrasound guidance, however no fluid could be aspirated. This is likely a hematoma which has not yet liquified. Electronically Signed   By: Miachel Roux M.D.   On: 03/18/2022 13:00    Anti-infectives: Anti-infectives (From admission, onward)    Start     Dose/Rate Route Frequency Ordered Stop   03/18/22 1245  ceFAZolin (ANCEF) IVPB 2g/100 mL premix        2 g 200 mL/hr over 30 Minutes Intravenous  Once 03/18/22 1158 03/18/22 1245      EGD 7/30 - No gross lesions in esophagus proximally. LA Grade D esophagitis with bleeding. - Salmon-colored mucosa suspicious for Barrett's esophagus - distally. Biopsied. - Z-line irregular, 40 cm from the incisors. - Extrinsic impression deformity in the gastric body/antrum. - Erosive gastritis. Biopsied. - Erythematous duodenopathy in bulb. No other gross lesions in the first portion of the duodenum and in the second portion of the duodenum. - Major papilla is bulbous.  Assessment/Plan Post op intra-abdominal fluid collection Hx Laparoscopic cholecystectomy by Dr. Aviva Signs 02/22/2022 - CT w/ Large bilobed fluid collection likely arising from the gallbladder fossa and extending to the inferior/posterior aspect of the left hepatic lobe with additional fluid collection/biloma anterior to the right hepatic lobe.  - IR drain 7/29. Cx pending. NGTD. Drain w/ bilious ouptut. HIDA pending.  - EGD results as noted above.   FEN - NPO for now, await HIDA scan results VTE - SCDS, okay for chem ppx  from our standpoint ID - Ancef 7/29. None currently. Afebrile. No tachycardia or hypotension. WBC 5.9  CKD4 w/ AKI HTN   LOS: 3 days    Jillyn Ledger , [facility] 03/20/2022, 9:43 AM Please see Amion for pager number during day hours 7:00am-4:30pm

## 2022-03-20 NOTE — H&P (View-Only) (Signed)
Daily Rounding Note  03/20/2022, 3:31 PM  LOS: 3 days   SUBJECTIVE:   Chief complaint: Bile leak.  Biloma.  Severe esophagitis.  Erosive gastritis.  Continues to have sore abdomen in the region of biliary drain.  Bile drain output to 30 mL yesterday, 80 mL recorded thus far today.  Tolerated a small to moderate amount of solid heart healthy diet this afternoon, previously tolerated full liquids.  Small bowel movement this morning  OBJECTIVE:         Vital signs in last 24 hours:    Temp:  [97.7 F (36.5 C)-98.1 F (36.7 C)] 98.1 F (36.7 C) (07/31 1158) Pulse Rate:  [66-90] 81 (07/31 1158) Resp:  [16-18] 16 (07/31 1158) BP: (127-160)/(79-100) 148/87 (07/31 1158) SpO2:  [99 %-100 %] 99 % (07/31 1158) Last BM Date : 03/19/22 Filed Weights   03/17/22 1558  Weight: 76.7 kg   General: Patient looks mildly ill.  Resting comfortably on the bed. Heart: RRR. Chest: Air bilaterally.  No labored breathing or cough Abdomen: Drain in place.  Bilious clear brown liquid in the JP bulb.  Dressing overlying the catheter site is clean dry and intact.  Tenderness without guarding or rebound in the region of the drain.  Surgical incision sites bandaged and bandages are also CDI. Extremities: No CCE. Neuro/Psych: Oriented x3.  No gross deficits.  No tremors.  Intake/Output from previous day: 07/30 0701 - 07/31 0700 In: 505 [I.V.:500] Out: 230 [Drains:230]  Intake/Output this shift: Total I/O In: 5 [Other:5] Out: 80 [Drains:80]  Lab Results: Recent Labs    03/18/22 0057 03/18/22 1122 03/19/22 0150 03/20/22 0117  WBC 8.1  --  7.1 5.9  HGB 7.8* 9.7* 9.5* 8.8*  HCT 23.9* 31.9* 29.7* 26.8*  PLT 305  --  347 348   BMET Recent Labs    03/18/22 0057 03/19/22 0150 03/20/22 0117  NA 139 141 142  K 4.5 4.5 4.8  CL 111 114* 115*  CO2 19* 20* 19*  GLUCOSE 108* 94 112*  BUN 97* 65* 42*  CREATININE 2.56* 2.13* 1.89*  CALCIUM  8.3* 8.4* 8.8*   LFT Recent Labs    03/17/22 1611 03/18/22 1122 03/19/22 0150  PROT 6.6 6.0* 6.6  ALBUMIN 2.1* 1.9* 2.1*  AST 21 21 23   ALT 30 25 25   ALKPHOS 203* 146* 150*  BILITOT 0.6 1.1 0.5  BILIDIR  --  0.2  --   IBILI  --  0.9  --    PT/INR Recent Labs    03/18/22 0057  LABPROT 16.3*  INR 1.3*   Hepatitis Panel No results for input(s): "HEPBSAG", "HCVAB", "HEPAIGM", "HEPBIGM" in the last 72 hours.  Studies/Results: NM HEPATOBILIARY LEAK (POST-SURGICAL)  Result Date: 03/20/2022 CLINICAL DATA:  Post cholecystectomy, concern for biliary leak. EXAM: NUCLEAR MEDICINE HEPATOBILIARY IMAGING TECHNIQUE: Sequential images of the abdomen were obtained out to 60 minutes following intravenous administration of radiopharmaceutical. RADIOPHARMACEUTICALS:  5.3 mCi Tc-41m  Choletec IV COMPARISON:  CT March 17, 2022 FINDINGS: Prompt uptake and biliary excretion of activity by the liver is seen. Biliary activity passes into small bowel, consistent with patent common bile duct. Focal area radiotracer uptake is seen in the gallbladder fossa and extending behind the porta hepatis towards the left lobe of the liver which appears to correspond with a fluid collection seen on prior CT. IMPRESSION: Focal radiotracer uptake in the gallbladder fossa extending along the porta toward the left lobe of the  liver likely corresponding with the fluid collection seen on prior CT and consistent with biliary leak. Electronically Signed   By: Dahlia Bailiff M.D.   On: 03/20/2022 14:25       Scheduled Meds:  carvedilol  6.25 mg Oral BID WC   docusate sodium  100 mg Oral BID   pantoprazole (PROTONIX) IV  40 mg Intravenous Q12H   sodium chloride flush  5 mL Intracatheter Q8H   sucralfate  1 g Oral TID WC & HS   Continuous Infusions:  sodium chloride Stopped (03/17/22 2015)   PRN Meds:.HYDROmorphone (DILAUDID) injection, ipratropium-albuterol, ondansetron **OR** ondansetron (ZOFRAN) IV, oxyCODONE, polyethylene  glycol    ASSESMENT:     GIB w melena, acute anemia Hb 13.6... 7.  2 PRBC to date.   03/19/2022 EGD: Severe, grade D esophagitis with bleeding.  Suspicion for Barrett's esophagus.  Deformity of extrinsic compression in gastric body/antrum.  Erosive gastritis.  Duodenal bulb erythema.  Bulbous major papilla.  Lap cholecystectomy 02/23/2022. Dr Arnoldo Morale.  Persistent RUQ pain, N/V, p.o. intolerance.  03/17/2022 noncontrast CTAP: Large fluid collection at GB fossa extending to left hepatic lobe, measuring up to 25.3 cm, suspicious for biloma.  Additional fluid collection anterior to right hepatic lobe.  Mass effect on stomach due to the large fluid collection.  03/18/2022 percutaneous aspiration and drain placement.  900 mL cloudy brown fluid.  Preliminary fluid analysis shows no WBCs, no organisms.  Orders to have fluid sent for bilirubin has not yet been collected/sent.  HIDA day: Confirms bile leak.   PLAN      ERCP set for tomorrow time TBD.  Patient has already tolerated heart healthy diet earlier today so we will continue this but she has n.p.o. after midnight orders.  Dose abx before ERCP  Repeat EGD in 4 months to check for healing of the ulcers and esophagitis.  Continue Protonix 40 mg IV bid for now, then 40 po bid, start after ERCP tmrw.            Azucena Freed  03/20/2022, 3:31 PM Phone [phone]

## 2022-03-20 NOTE — Progress Notes (Signed)
   GB fossa drain placed in IR 7/29 Not in room now ---- to HIDA likely  Afeb OP 230 cc since yesterday No complaints per chart ABSCESS    Special Requests ABDOMEN   Gram Stain NO SQUAMOUS EPITHELIAL CELLS SEEN  NO WBC SEEN  NO ORGANISMS SEEN   Culture NO GROWTH 2 DAYS     Will retry to see pt today or tomorrow

## 2022-03-20 NOTE — Progress Notes (Signed)
Daily Rounding Note  03/20/2022, 3:31 PM  LOS: 3 days   SUBJECTIVE:   Chief complaint: Bile leak.  Biloma.  Severe esophagitis.  Erosive gastritis.  Continues to have sore abdomen in the region of biliary drain.  Bile drain output to 30 mL yesterday, 80 mL recorded thus far today.  Tolerated a small to moderate amount of solid heart healthy diet this afternoon, previously tolerated full liquids.  Small bowel movement this morning  OBJECTIVE:         Vital signs in last 24 hours:    Temp:  [97.7 F (36.5 C)-98.1 F (36.7 C)] 98.1 F (36.7 C) (07/31 1158) Pulse Rate:  [66-90] 81 (07/31 1158) Resp:  [16-18] 16 (07/31 1158) BP: (127-160)/(79-100) 148/87 (07/31 1158) SpO2:  [99 %-100 %] 99 % (07/31 1158) Last BM Date : 03/19/22 Filed Weights   03/17/22 1558  Weight: 76.7 kg   General: Patient looks mildly ill.  Resting comfortably on the bed. Heart: RRR. Chest: Air bilaterally.  No labored breathing or cough Abdomen: Drain in place.  Bilious clear brown liquid in the JP bulb.  Dressing overlying the catheter site is clean dry and intact.  Tenderness without guarding or rebound in the region of the drain.  Surgical incision sites bandaged and bandages are also CDI. Extremities: No CCE. Neuro/Psych: Oriented x3.  No gross deficits.  No tremors.  Intake/Output from previous day: 07/30 0701 - 07/31 0700 In: 505 [I.V.:500] Out: 230 [Drains:230]  Intake/Output this shift: Total I/O In: 5 [Other:5] Out: 80 [Drains:80]  Lab Results: Recent Labs    03/18/22 0057 03/18/22 1122 03/19/22 0150 03/20/22 0117  WBC 8.1  --  7.1 5.9  HGB 7.8* 9.7* 9.5* 8.8*  HCT 23.9* 31.9* 29.7* 26.8*  PLT 305  --  347 348   BMET Recent Labs    03/18/22 0057 03/19/22 0150 03/20/22 0117  NA 139 141 142  K 4.5 4.5 4.8  CL 111 114* 115*  CO2 19* 20* 19*  GLUCOSE 108* 94 112*  BUN 97* 65* 42*  CREATININE 2.56* 2.13* 1.89*  CALCIUM  8.3* 8.4* 8.8*   LFT Recent Labs    03/17/22 1611 03/18/22 1122 03/19/22 0150  PROT 6.6 6.0* 6.6  ALBUMIN 2.1* 1.9* 2.1*  AST 21 21 23   ALT 30 25 25   ALKPHOS 203* 146* 150*  BILITOT 0.6 1.1 0.5  BILIDIR  --  0.2  --   IBILI  --  0.9  --    PT/INR Recent Labs    03/18/22 0057  LABPROT 16.3*  INR 1.3*   Hepatitis Panel No results for input(s): "HEPBSAG", "HCVAB", "HEPAIGM", "HEPBIGM" in the last 72 hours.  Studies/Results: NM HEPATOBILIARY LEAK (POST-SURGICAL)  Result Date: 03/20/2022 CLINICAL DATA:  Post cholecystectomy, concern for biliary leak. EXAM: NUCLEAR MEDICINE HEPATOBILIARY IMAGING TECHNIQUE: Sequential images of the abdomen were obtained out to 60 minutes following intravenous administration of radiopharmaceutical. RADIOPHARMACEUTICALS:  5.3 mCi Tc-57m  Choletec IV COMPARISON:  CT March 17, 2022 FINDINGS: Prompt uptake and biliary excretion of activity by the liver is seen. Biliary activity passes into small bowel, consistent with patent common bile duct. Focal area radiotracer uptake is seen in the gallbladder fossa and extending behind the porta hepatis towards the left lobe of the liver which appears to correspond with a fluid collection seen on prior CT. IMPRESSION: Focal radiotracer uptake in the gallbladder fossa extending along the porta toward the left lobe of the  liver likely corresponding with the fluid collection seen on prior CT and consistent with biliary leak. Electronically Signed   By: Dahlia Bailiff M.D.   On: 03/20/2022 14:25       Scheduled Meds:  carvedilol  6.25 mg Oral BID WC   docusate sodium  100 mg Oral BID   pantoprazole (PROTONIX) IV  40 mg Intravenous Q12H   sodium chloride flush  5 mL Intracatheter Q8H   sucralfate  1 g Oral TID WC & HS   Continuous Infusions:  sodium chloride Stopped (03/17/22 2015)   PRN Meds:.HYDROmorphone (DILAUDID) injection, ipratropium-albuterol, ondansetron **OR** ondansetron (ZOFRAN) IV, oxyCODONE, polyethylene  glycol    ASSESMENT:     GIB w melena, acute anemia Hb 13.6... 7.  2 PRBC to date.   03/19/2022 EGD: Severe, grade D esophagitis with bleeding.  Suspicion for Barrett's esophagus.  Deformity of extrinsic compression in gastric body/antrum.  Erosive gastritis.  Duodenal bulb erythema.  Bulbous major papilla.  Lap cholecystectomy 02/23/2022. Dr Arnoldo Morale.  Persistent RUQ pain, N/V, p.o. intolerance.  03/17/2022 noncontrast CTAP: Large fluid collection at GB fossa extending to left hepatic lobe, measuring up to 25.3 cm, suspicious for biloma.  Additional fluid collection anterior to right hepatic lobe.  Mass effect on stomach due to the large fluid collection.  03/18/2022 percutaneous aspiration and drain placement.  900 mL cloudy brown fluid.  Preliminary fluid analysis shows no WBCs, no organisms.  Orders to have fluid sent for bilirubin has not yet been collected/sent.  HIDA day: Confirms bile leak.   PLAN      ERCP set for tomorrow time TBD.  Patient has already tolerated heart healthy diet earlier today so we will continue this but she has n.p.o. after midnight orders.  Dose abx before ERCP  Repeat EGD in 4 months to check for healing of the ulcers and esophagitis.  Continue Protonix 40 mg IV bid for now, then 40 po bid, start after ERCP tmrw.            Azucena Freed  03/20/2022, 3:31 PM Phone [phone]

## 2022-03-21 ENCOUNTER — Inpatient Hospital Stay (HOSPITAL_COMMUNITY): Payer: 59 | Admitting: Certified Registered Nurse Anesthetist

## 2022-03-21 ENCOUNTER — Encounter (HOSPITAL_COMMUNITY)
Admission: EM | Disposition: A | Discharge status: Home / Self Care | Payer: Self-pay | Source: Home / Self Care | Attending: Family Medicine

## 2022-03-21 ENCOUNTER — Encounter (HOSPITAL_COMMUNITY): Payer: Self-pay | Admitting: Family Medicine

## 2022-03-21 ENCOUNTER — Telehealth: Payer: Self-pay

## 2022-03-21 ENCOUNTER — Inpatient Hospital Stay (HOSPITAL_COMMUNITY): Payer: 59

## 2022-03-21 DIAGNOSIS — D62 Acute posthemorrhagic anemia: Secondary | ICD-10-CM | POA: Diagnosis not present

## 2022-03-21 DIAGNOSIS — K838 Other specified diseases of biliary tract: Secondary | ICD-10-CM

## 2022-03-21 DIAGNOSIS — F909 Attention-deficit hyperactivity disorder, unspecified type: Secondary | ICD-10-CM | POA: Diagnosis not present

## 2022-03-21 DIAGNOSIS — N179 Acute kidney failure, unspecified: Secondary | ICD-10-CM | POA: Diagnosis not present

## 2022-03-21 DIAGNOSIS — T8189XA Other complications of procedures, not elsewhere classified, initial encounter: Secondary | ICD-10-CM | POA: Diagnosis not present

## 2022-03-21 HISTORY — PX: ERCP: SHX5425

## 2022-03-21 HISTORY — PX: BILIARY STENT PLACEMENT: SHX5538

## 2022-03-21 HISTORY — PX: SPHINCTEROTOMY: SHX5544

## 2022-03-21 HISTORY — PX: REMOVAL OF STONES: SHX5545

## 2022-03-21 LAB — CBC
HCT: 26.8 % — ABNORMAL LOW (ref 36.0–46.0)
Hemoglobin: 8.6 g/dL — ABNORMAL LOW (ref 12.0–15.0)
MCH: 28.3 pg (ref 26.0–34.0)
MCHC: 32.1 g/dL (ref 30.0–36.0)
MCV: 88.2 fL (ref 80.0–100.0)
Platelets: 331 10*3/uL (ref 150–400)
RBC: 3.04 MIL/uL — ABNORMAL LOW (ref 3.87–5.11)
RDW: 14.9 % (ref 11.5–15.5)
WBC: 5.9 10*3/uL (ref 4.0–10.5)
nRBC: 0 % (ref 0.0–0.2)

## 2022-03-21 LAB — T3: T3, Total: 54 ng/dL — ABNORMAL LOW (ref 71–180)

## 2022-03-21 LAB — COMPREHENSIVE METABOLIC PANEL
ALT: 73 U/L — ABNORMAL HIGH (ref 0–44)
AST: 96 U/L — ABNORMAL HIGH (ref 15–41)
Albumin: 2.1 g/dL — ABNORMAL LOW (ref 3.5–5.0)
Alkaline Phosphatase: 157 U/L — ABNORMAL HIGH (ref 38–126)
Anion gap: 6 (ref 5–15)
BUN: 31 mg/dL — ABNORMAL HIGH (ref 6–20)
CO2: 20 mmol/L — ABNORMAL LOW (ref 22–32)
Calcium: 8.5 mg/dL — ABNORMAL LOW (ref 8.9–10.3)
Chloride: 114 mmol/L — ABNORMAL HIGH (ref 98–111)
Creatinine, Ser: 1.91 mg/dL — ABNORMAL HIGH (ref 0.44–1.00)
GFR, Estimated: 32 mL/min — ABNORMAL LOW (ref >60)
Glucose, Bld: 86 mg/dL (ref 70–99)
Potassium: 4.6 mmol/L (ref 3.5–5.1)
Sodium: 140 mmol/L (ref 135–145)
Total Bilirubin: 0.7 mg/dL (ref 0.3–1.2)
Total Protein: 6.1 g/dL — ABNORMAL LOW (ref 6.5–8.1)

## 2022-03-21 LAB — T3, FREE: T3, Free: 1.4 pg/mL — ABNORMAL LOW (ref 2.0–4.4)

## 2022-03-21 LAB — SURGICAL PATHOLOGY

## 2022-03-21 SURGERY — ERCP, WITH INTERVENTION IF INDICATED
Anesthesia: General

## 2022-03-21 MED ORDER — SODIUM CHLORIDE 0.9 % IV SOLN
INTRAVENOUS | Status: AC
  Filled 2022-03-21: qty 8

## 2022-03-21 MED ORDER — SODIUM CHLORIDE 0.9 % IV SOLN
3.0000 g | Freq: Once | INTRAVENOUS | Status: AC
  Administered 2022-03-21: 3 g via INTRAVENOUS

## 2022-03-21 MED ORDER — PHENYLEPHRINE HCL-NACL 20-0.9 MG/250ML-% IV SOLN
INTRAVENOUS | Status: DC | PRN
  Administered 2022-03-21: 40 ug/min via INTRAVENOUS

## 2022-03-21 MED ORDER — SODIUM CHLORIDE 0.9 % IV SOLN
INTRAVENOUS | Status: DC
Start: 2022-03-21 — End: 2022-03-21

## 2022-03-21 MED ORDER — ROCURONIUM BROMIDE 10 MG/ML (PF) SYRINGE
PREFILLED_SYRINGE | INTRAVENOUS | Status: DC | PRN
  Administered 2022-03-21: 10 mg via INTRAVENOUS
  Administered 2022-03-21: 60 mg via INTRAVENOUS

## 2022-03-21 MED ORDER — SUGAMMADEX SODIUM 200 MG/2ML IV SOLN
INTRAVENOUS | Status: DC | PRN
  Administered 2022-03-21: 200 mg via INTRAVENOUS

## 2022-03-21 MED ORDER — INDOMETHACIN 50 MG RE SUPP
RECTAL | Status: DC | PRN
  Administered 2022-03-21: 100 mg via RECTAL

## 2022-03-21 MED ORDER — PROPOFOL 10 MG/ML IV BOLUS
INTRAVENOUS | Status: DC | PRN
  Administered 2022-03-21: 130 mg via INTRAVENOUS

## 2022-03-21 MED ORDER — MIDAZOLAM HCL 2 MG/2ML IJ SOLN
INTRAMUSCULAR | Status: DC | PRN
  Administered 2022-03-21: 2 mg via INTRAVENOUS

## 2022-03-21 MED ORDER — DEXAMETHASONE SODIUM PHOSPHATE 10 MG/ML IJ SOLN
INTRAMUSCULAR | Status: DC | PRN
  Administered 2022-03-21: 10 mg via INTRAVENOUS

## 2022-03-21 MED ORDER — DICLOFENAC SUPPOSITORY 100 MG: 100.0000 mg | Freq: Once | RECTAL | Status: DC

## 2022-03-21 MED ORDER — PHENYLEPHRINE 80 MCG/ML (10ML) SYRINGE FOR IV PUSH (FOR BLOOD PRESSURE SUPPORT)
PREFILLED_SYRINGE | INTRAVENOUS | Status: DC | PRN
  Administered 2022-03-21: 80 ug via INTRAVENOUS

## 2022-03-21 MED ORDER — ONDANSETRON HCL 4 MG/2ML IJ SOLN
INTRAMUSCULAR | Status: DC | PRN
  Administered 2022-03-21: 4 mg via INTRAVENOUS

## 2022-03-21 MED ORDER — GLUCAGON HCL RDNA (DIAGNOSTIC) 1 MG IJ SOLR
INTRAMUSCULAR | Status: AC
  Filled 2022-03-21: qty 1

## 2022-03-21 MED ORDER — LIDOCAINE 2% (20 MG/ML) 5 ML SYRINGE
INTRAMUSCULAR | Status: DC | PRN
  Administered 2022-03-21: 60 mg via INTRAVENOUS

## 2022-03-21 MED ORDER — GLUCAGON HCL RDNA (DIAGNOSTIC) 1 MG IJ SOLR
INTRAMUSCULAR | Status: DC | PRN
  Administered 2022-03-21 (×2): .25 mg via INTRAVENOUS

## 2022-03-21 MED ORDER — FENTANYL CITRATE (PF) 250 MCG/5ML IJ SOLN
INTRAMUSCULAR | Status: DC | PRN
  Administered 2022-03-21: 100 ug via INTRAVENOUS

## 2022-03-21 MED ORDER — PANTOPRAZOLE SODIUM 40 MG PO TBEC
40.0000 mg | DELAYED_RELEASE_TABLET | Freq: Two times a day (BID) | ORAL | Status: DC
  Administered 2022-03-21 – 2022-03-22 (×2): 40 mg via ORAL
  Filled 2022-03-21 (×2): qty 1

## 2022-03-21 MED ORDER — SODIUM CHLORIDE 0.9 % IV SOLN
INTRAVENOUS | Status: DC | PRN
  Administered 2022-03-21: 90 mL

## 2022-03-21 MED ORDER — LACTATED RINGERS IV SOLN: INTRAVENOUS | Status: DC | PRN

## 2022-03-21 MED ORDER — INDOMETHACIN 50 MG RE SUPP
RECTAL | Status: AC
  Filled 2022-03-21: qty 2

## 2022-03-21 NOTE — Transfer of Care (Signed)
Immediate Anesthesia Transfer of Care Note  Patient: Margaret Yoder  Procedure(s) Performed: ENDOSCOPIC RETROGRADE CHOLANGIOPANCREATOGRAPHY (ERCP) SPHINCTEROTOMY REMOVAL OF STONES BILIARY STENT PLACEMENT  Patient Location: PACU  Anesthesia Type:General  Level of Consciousness: awake, alert  and oriented  Airway & Oxygen Therapy: Patient Spontanous Breathing and Patient connected to face mask oxygen  Post-op Assessment: Report given to RN, Post -op Vital signs reviewed and stable and Patient moving all extremities  Post vital signs: Reviewed and stable  Last Vitals:  Vitals Value Taken Time  BP 122/72 03/21/22 1149  Temp    Pulse 74 03/21/22 1151  Resp 18 03/21/22 1151  SpO2 100 % 03/21/22 1151  Vitals shown include unvalidated device data.  Last Pain:  Vitals:   03/21/22 1002  TempSrc: Temporal  PainSc: 7       Patients Stated Pain Goal: 3 (97/41/63 8453)  Complications: No notable events documented.

## 2022-03-21 NOTE — Op Note (Signed)
[facility] Patient Name: Margaret Yoder Procedure Date : 03/21/2022 MRN: [id number] Attending MD: Justice Britain , MD Date of Birth: June 29, 1976 CSN: [id number] Age: 46 Admit Type: Inpatient Procedure:                ERCP Indications:              Bile leak, Abnormal hepatobiliary scintigraphy,                            Postop exam: Cholecystectomy Providers:                Justice Britain, MD, Ervin Knack, RN, [facility] Technician, Technician Referring MD:             [facility], [facility] Medicines:                General Anesthesia, Unasyn 1.5 g IV, Indomethacin                            100 mg PR, Glucagon 0.5 mg IV Complications:            No immediate complications. Estimated Blood Loss:     Estimated blood loss: none. Procedure:                Pre-Anesthesia Assessment:                           - Prior to the procedure, a History and Physical                            was performed, and patient medications and                            allergies were reviewed. The patient's tolerance of                            previous anesthesia was also reviewed. The risks                            and benefits of the procedure and the sedation                            options and risks were discussed with the patient.                            All questions were answered, and informed consent                            was obtained. Prior Anticoagulants: The patient has                            taken no previous anticoagulant or antiplatelet  agents. ASA Grade Assessment: III - A patient with                            severe systemic disease. After reviewing the risks                            and benefits, the patient was deemed in                            satisfactory condition to undergo the procedure.                           After obtaining informed consent, the scope was                             passed under direct vision. Throughout the                            procedure, the patient's blood pressure, pulse, and                            oxygen saturations were monitored continuously. The                            [id number]-Q190V ([id number] Olympus duodenoscope was                            introduced through the mouth, and used to inject                            contrast into and used to cannulate the bile duct.                            The ERCP was accomplished without difficulty. The                            patient tolerated the procedure. Scope In: Scope Out: Findings:      A scout film of the abdomen was obtained. One percutaneous drain ending       in the Right upper quadrant was seen. Surgical clips, consistent with       previous cholecystectomy, were seen in the area of the entire biliary       tree except for the gallbladder.      The esophagus was successfully intubated under direct vision without       detailed examination of the pharynx, larynx, and associated structures,       and upper GI tract. The major papilla was bulging and congested.      A short 0.035 inch Soft Jagwire was passed into the biliary tree though       had preference to stay within the cystic duct. It was left in place. A       second short 0.035 inch Soft Jagwire was passed into the proximal       biliary tree. The Hydratome sphincterotome was passed over the guidewire  and the bile duct was then deeply cannulated. Contrast was injected. I       personally interpreted the bile duct images. Ductal flow of contrast was       adequate. Image quality was adequate. Contrast extended to the hepatic       ducts. Opacification of the entire biliary tree except for the       gallbladder was successful. The common bile duct was moderately dilated.       The largest diameter was 10 mm. Extravasation of contrast originating       from the lower third of the main bile duct was  observed - suggestive of       a lower cystic duct takeoff. An 8 mm biliary sphincterotomy was made       with a monofilament Hydratome sphincterotome using ERBE electrocautery.       There was no post-sphincterotomy bleeding. To discover objects, the       biliary tree was swept with a retrieval balloon. Small amounts of sludge       wwere swept from the duct. An occlusion cholangiogram was performed that       showed extravasation of contrast originating from the in the biliary       system. Due to the high-grade leak/biloma, decision made to pursue       FCSEMS. One [place] 10 mm by 4 cm covered metal biliary stent was placed       into the common bile duct. Bile flowed through the stent. The stent was       in good position. An occlusion cholangiogram was then performed that       showed no further significant biliary pathology or further leak.      A pancreatogram was not performed.      The duodenoscope was withdrawn from the patient. Impression:               - The major papilla appeared congested and bulging.                           - The common bile duct was moderately dilated.                           - Required a two-wire technique to enter into the                            biliary tree but the first wire was in the cystic                            duct (the pancreas was never entered).                           - A bile leak was found.                           - The biliary tree was swept and sludge was found.                           - A biliary sphincterotomy was performed.                           -  One covered metal biliary stent was placed into                            the distal common bile duct. Recommendation:           - The patient will be observed post-procedure,                            until all discharge criteria are met.                           - Return patient to hospital ward for ongoing care.                           - Advance diet as  tolerated.                           - Check liver enzymes (AST, ALT, alkaline                            phosphatase, bilirubin) in the morning.                           - Observe patient's clinical course.                           - Monitor drain output.                           - Watch for pancreatitis, bleeding, perforation,                            and cholangitis.                           - Repeat ERCP in 6-10 weeks.                           - The findings and recommendations were discussed                            with the patient.                           - The findings and recommendations were discussed                            with the referring physician. Procedure Code(s):        --- Professional ---                           [phone], Endoscopic retrograde                            cholangiopancreatography (ERCP); with placement of  endoscopic stent into biliary or pancreatic duct,                            including pre- and post-dilation and guide wire                            passage, when performed, including sphincterotomy,                            when performed, each stent                           [id number], Endoscopic retrograde                            cholangiopancreatography (ERCP); with removal of                            calculi/debris from biliary/pancreatic duct(s) Diagnosis Code(s):        --- Professional ---                           K83.8, Other specified diseases of biliary tract                           K83.9, Disease of biliary tract, unspecified                           R94.5, Abnormal results of liver function studies                           Z09, Encounter for follow-up examination after                            completed treatment for conditions other than                            malignant neoplasm                           Z90.49, Acquired absence of other specified parts                             of digestive tract CPT copyright 2019 [employer]. All rights reserved. The codes documented in this report are preliminary and upon coder review may  be revised to meet current compliance requirements. Justice Britain, MD 03/21/2022 11:53:03 AM Number of Addenda: 0

## 2022-03-21 NOTE — Telephone Encounter (Signed)
-----   Message from Irving Copas., MD sent at 03/21/2022 11:48 AM EDT ----- Regarding: Follow-up ERCP Margaret Yoder, This patient needs a follow-up ERCP with me in 6 to 10 weeks. She will remain an inpatient and have follow-up with surgery at [facility]. Thanks. GM

## 2022-03-21 NOTE — Anesthesia Postprocedure Evaluation (Signed)
Anesthesia Post Note  Patient: MARLETTE CURVIN  Procedure(s) Performed: ENDOSCOPIC RETROGRADE CHOLANGIOPANCREATOGRAPHY (ERCP) SPHINCTEROTOMY REMOVAL OF STONES BILIARY STENT PLACEMENT     Patient location during evaluation: PACU Anesthesia Type: General Level of consciousness: awake and alert Pain management: pain level controlled Vital Signs Assessment: post-procedure vital signs reviewed and stable Respiratory status: spontaneous breathing, nonlabored ventilation and respiratory function stable Cardiovascular status: blood pressure returned to baseline and stable Postop Assessment: no apparent nausea or vomiting Anesthetic complications: no   No notable events documented.  Last Vitals:  Vitals:   03/21/22 1215 03/21/22 1254  BP: 123/87 (!) 162/88  Pulse: 72 64  Resp: 15 18  Temp: (!) 36.3 C 36.6 C  SpO2: 98% 100%    Last Pain:  Vitals:   03/21/22 1215  TempSrc:   PainSc: 4                  Mayda Shippee,W. EDMOND

## 2022-03-21 NOTE — Progress Notes (Addendum)
Progress Note   Patient: Margaret Yoder WUJ:[id number] DOB: 1976/04/19 DOA: 03/17/2022     4 DOS: the patient was seen and examined on 03/21/2022        Brief hospital course: [name] is a 46 y.o. F with anxiety, HTN, smoking, CKD IV baseline Cr 1.9-2.1 and recent cholecystectomy who presented with recurrent RUQ pain.  Had recent uncomplicated cholecystectomy.  After discharge, developed progressively worsening RUQ pain, then vomiting, then "dark stools" then dizziness so she returned to the ER.   7/28 Admitted 7/29 Perc drain placed 7/30 EGD showed gastritis 7/31 HIDA confirmed leak 8/1 ERCP and stent     Assessment and Plan: * Biloma following surgery, initial encounter Patient admitted and had percutaneous drain placed 7/29, fluid appeared to be bile, culture no growth.    Antibiotics held and she had no fever or worsening symptoms.  HIDA obtained, confirmed bile leak into gallbladder fossa.  ERCP on 8/1, confirmed leak, placed stent. - Consult GI, appreciate cares  Acute kidney injury superimposed on chronic kidney disease ([place]) Baseline Cr 1.9-2.1, presented with Cr 2.7.   Currently improved to baseline with fluids - Avoid nephrotoxins   Melena due to gastritis, esophagitis Clinically reported melena. EGD 7/30, showed esophagitis, gastritis and duodenitis without ulcer or mass.   Completed 48 hours IV PPI - Transition to pantoprazole PO BID - Consult GI, appreciate cares  Possible goiter During EGD, anesthesia perceived enlargement of the thyroid.  On exam, I can maybe appreciate diffuse prominence of thyroid, certainly there is no discrete nodule or asymmetry.  TSH normal, fT4 minimally elevated, doubt this is significant, T3 pending.   - Given normal TFTs, would probably recommend f/u with PCP for outpatient thyroid ultrasound - Given her acute illness would probably also repeat TFTs in about 1 month   Anxiety No longer on daily  benzodiazepines.  ADHD No longer on Adderall.  Chronic pain syndrome No longer on daily opiates.   Acute blood loss anemia Transfused 2 units on admission.  Hgb stable since.  CKD (chronic kidney disease), stage IV (HCC) Baseline in last 6 months appaers to be in the 1.9-2.1 range.  CKD III ruled out, this would be CKD IV.  Essential hypertension BP normal - Continue carvedilol - Hold amlodipine          Subjective: Still has some abdominal pain.  No fever, no confusion, no respiraotry symptoms, no vomiting.     Physical Exam: Vitals:   03/21/22 1149 03/21/22 1200 03/21/22 1215 03/21/22 1254  BP: 122/72 124/71 123/87 (!) 162/88  Pulse: 71 71 72 64  Resp: 16 15 15 18   Temp: 97.9 F (36.6 C)  (!) 97.4 F (36.3 C) 97.9 F (36.6 C)  TempSrc:      SpO2: 100% 99% 98% 100%  Weight:      Height:       Adult female, no jaundice, lying in bed, apepars tired RRR no murmurs, no LE edema Respiratory rate normal, lungs clear without rales or wheezes Abdomen with diffuse tenderness, more on left and epigastric, guarding noted, no rigidity Mentation normal, face symmetric, speech fluent, moves upper extremities with normal strength and coordination  Data Reviewed: T3 stil pending Hgb 8.6 no change, WBC and platelets normal BMP shows Cr 1.9 no change, LFTs up and Tbili normal        Disposition: Status is: Inpatient The aptient was admitted for biloma.  She has now undergone stenting of the bile leak.  Discussed with GI, if able to advance diet tonight, ambulatory tonight and pain controlled, likely home in AM tomorrow           Author: Edwin Dada, MD 03/21/2022 1:19 PM  For on call review [email or web address].

## 2022-03-21 NOTE — Interval H&P Note (Signed)
History and Physical Interval Note:  03/21/2022 10:19 AM  Margaret Yoder  has presented today for surgery, with the diagnosis of bile leak/biloma.  The various methods of treatment have been discussed with the patient and family. After consideration of risks, benefits and other options for treatment, the patient has consented to  Procedure(s): ENDOSCOPIC RETROGRADE CHOLANGIOPANCREATOGRAPHY (ERCP) (N/A) as a surgical intervention.  The patient's history has been reviewed, patient examined, no change in status, stable for surgery.  I have reviewed the patient's chart and labs.  Questions were answered to the patient's satisfaction.    The risks of an ERCP were discussed at length, including but not limited to the risk of perforation, bleeding, abdominal pain, post-ERCP pancreatitis (while usually mild can be severe and even life threatening).   [employer]

## 2022-03-21 NOTE — Anesthesia Preprocedure Evaluation (Signed)
Anesthesia Evaluation  Patient identified by MRN, date of birth, ID band Patient awake    Reviewed: Allergy & Precautions, H&P , NPO status , Patient's Chart, lab work & pertinent test results, reviewed documented beta blocker date and time   Airway Mallampati: II  TM Distance: >3 FB Neck ROM: Full    Dental no notable dental hx. (+) Teeth Intact, Dental Advisory Given   Pulmonary Current Smoker and Patient abstained from smoking.,    Pulmonary exam normal breath sounds clear to auscultation       Cardiovascular hypertension, Pt. on medications and Pt. on home beta blockers  Rhythm:Regular Rate:Normal     Neuro/Psych Anxiety Depression negative neurological ROS     GI/Hepatic negative GI ROS, Neg liver ROS,   Endo/Other  negative endocrine ROS  Renal/GU Renal InsufficiencyRenal disease  negative genitourinary   Musculoskeletal   Abdominal   Peds  Hematology  (+) Blood dyscrasia, anemia ,   Anesthesia Other Findings   Reproductive/Obstetrics negative OB ROS                             Anesthesia Physical Anesthesia Plan  ASA: 2  Anesthesia Plan: General   Post-op Pain Management: Minimal or no pain anticipated   Induction: Intravenous  PONV Risk Score and Plan: 3 and Ondansetron, Dexamethasone and Midazolam  Airway Management Planned: Oral ETT  Additional Equipment:   Intra-op Plan:   Post-operative Plan: Extubation in OR  Informed Consent: I have reviewed the patients History and Physical, chart, labs and discussed the procedure including the risks, benefits and alternatives for the proposed anesthesia with the patient or authorized representative who has indicated his/her understanding and acceptance.     Dental advisory given  Plan Discussed with: CRNA  Anesthesia Plan Comments:         Anesthesia Quick Evaluation

## 2022-03-21 NOTE — Anesthesia Procedure Notes (Addendum)
Procedure Name: Intubation Date/Time: 03/21/2022 10:45 AM  Performed by: Leonor Liv, CRNAPre-anesthesia Checklist: Patient identified, Emergency Drugs available, Suction available and Patient being monitored Patient Re-evaluated:Patient Re-evaluated prior to induction Oxygen Delivery Method: Circle System Utilized Preoxygenation: Pre-oxygenation with 100% oxygen Induction Type: IV induction Ventilation: Mask ventilation without difficulty Laryngoscope Size: Mac and 3 Grade View: Grade I Tube type: Oral Tube size: 7.0 mm Number of attempts: 1 Airway Equipment and Method: Stylet Placement Confirmation: ETT inserted through vocal cords under direct vision, positive ETCO2 and breath sounds checked- equal and bilateral Secured at: 21 cm Tube secured with: Tape Dental Injury: Teeth and Oropharynx as per pre-operative assessment

## 2022-03-21 NOTE — Progress Notes (Signed)
2 Days Post-Op  Subjective: CC: Stable abdominal pain. Tolerated cld yesterday without n/v HIDA +  ERCP planned for today.   Objective: Vital signs in last 24 hours: Temp:  [97.7 F (36.5 C)-98.1 F (36.7 C)] 97.7 F (36.5 C) (08/01 0502) Pulse Rate:  [77-82] 82 (08/01 0502) Resp:  [15-17] 15 (08/01 0502) BP: (126-148)/(77-87) 139/82 (08/01 0502) SpO2:  [99 %-100 %] 99 % (08/01 0502) Last BM Date : 03/19/22  Intake/Output from previous day: 07/31 0701 - 08/01 0700 In: 15  Out: 260 [Drains:260] Intake/Output this shift: No intake/output data recorded.  PE: Gen:  Alert, NAD, pleasant Abd: Soft, ND, generalized tenderness greatest in epigsatrium and ruq, +BS, IR drain bilious - 260/24 hours  Lab Results:  Recent Labs    03/20/22 0117 03/21/22 0508  WBC 5.9 5.9  HGB 8.8* 8.6*  HCT 26.8* 26.8*  PLT 348 331   BMET Recent Labs    03/20/22 0117 03/21/22 0508  NA 142 140  K 4.8 4.6  CL 115* 114*  CO2 19* 20*  GLUCOSE 112* 86  BUN 42* 31*  CREATININE 1.89* 1.91*  CALCIUM 8.8* 8.5*   PT/INR No results for input(s): "LABPROT", "INR" in the last 72 hours. CMP     Component Value Date/Time   NA 140 03/21/2022 0508   K 4.6 03/21/2022 0508   CL 114 (H) 03/21/2022 0508   CO2 20 (L) 03/21/2022 0508   GLUCOSE 86 03/21/2022 0508   BUN 31 (H) 03/21/2022 0508   CREATININE 1.91 (H) 03/21/2022 0508   CALCIUM 8.5 (L) 03/21/2022 0508   PROT 6.1 (L) 03/21/2022 0508   ALBUMIN 2.1 (L) 03/21/2022 0508   AST 96 (H) 03/21/2022 0508   ALT 73 (H) 03/21/2022 0508   ALKPHOS 157 (H) 03/21/2022 0508   BILITOT 0.7 03/21/2022 0508   GFRNONAA 32 (L) 03/21/2022 0508   GFRAA 23 (L) 02/19/2020 1210   Lipase     Component Value Date/Time   LIPASE 65 (H) 03/17/2022 1611    Studies/Results: NM HEPATOBILIARY LEAK (POST-SURGICAL)  Result Date: 03/20/2022 CLINICAL DATA:  Post cholecystectomy, concern for biliary leak. EXAM: NUCLEAR MEDICINE HEPATOBILIARY IMAGING TECHNIQUE:  Sequential images of the abdomen were obtained out to 60 minutes following intravenous administration of radiopharmaceutical. RADIOPHARMACEUTICALS:  5.3 mCi Tc-46m  Choletec IV COMPARISON:  CT March 17, 2022 FINDINGS: Prompt uptake and biliary excretion of activity by the liver is seen. Biliary activity passes into small bowel, consistent with patent common bile duct. Focal area radiotracer uptake is seen in the gallbladder fossa and extending behind the porta hepatis towards the left lobe of the liver which appears to correspond with a fluid collection seen on prior CT. IMPRESSION: Focal radiotracer uptake in the gallbladder fossa extending along the porta toward the left lobe of the liver likely corresponding with the fluid collection seen on prior CT and consistent with biliary leak. Electronically Signed   By: Dahlia Bailiff M.D.   On: 03/20/2022 14:25    Anti-infectives: Anti-infectives (From admission, onward)    Start     Dose/Rate Route Frequency Ordered Stop   03/18/22 1245  ceFAZolin (ANCEF) IVPB 2g/100 mL premix        2 g 200 mL/hr over 30 Minutes Intravenous  Once 03/18/22 1158 03/18/22 1245        EGD 7/30 - No gross lesions in esophagus proximally. LA Grade D esophagitis with bleeding. - Salmon-colored mucosa suspicious for Barrett's esophagus - distally. Biopsied. - Z-line irregular,  40 cm from the incisors. - Extrinsic impression deformity in the gastric body/antrum. - Erosive gastritis. Biopsied. - Erythematous duodenopathy in bulb. No other gross lesions in the first portion of the duodenum and in the second portion of the duodenum. - Major papilla is bulbous.   Assessment/Plan Bile leak  Hx Laparoscopic cholecystectomy by Dr. Aviva Signs 02/22/2022 - CT w/ Large bilobed fluid collection likely arising from the gallbladder fossa and extending to the inferior/posterior aspect of the left hepatic lobe with additional fluid collection/biloma anterior to the right hepatic  lobe.  - IR drain 7/29. Cx pending. NGTD.  - Drain w/ bilious ouptut. HIDA positive for bile leak. ERCP planned for today by GI.    FEN - NPO for ERCP VTE - SCDS, okay for chem ppx from our standpoint ID - Ancef 7/29. None currently. Afebrile. No tachycardia or hypotension. WBC 5.9    CKD4 w/ AKI - Cr 1.91 HTN EGD results as noted above. On PPI     LOS: 4 days    Margaret Yoder , [facility] Surgery 03/21/2022, 7:52 AM Please see Amion for pager number during day hours 7:00am-4:30pm

## 2022-03-22 ENCOUNTER — Encounter: Payer: Self-pay | Admitting: Gastroenterology

## 2022-03-22 ENCOUNTER — Other Ambulatory Visit (HOSPITAL_COMMUNITY): Payer: Self-pay

## 2022-03-22 DIAGNOSIS — T8189XS Other complications of procedures, not elsewhere classified, sequela: Secondary | ICD-10-CM

## 2022-03-22 DIAGNOSIS — R7989 Other specified abnormal findings of blood chemistry: Secondary | ICD-10-CM

## 2022-03-22 DIAGNOSIS — T8189XA Other complications of procedures, not elsewhere classified, initial encounter: Secondary | ICD-10-CM | POA: Diagnosis not present

## 2022-03-22 DIAGNOSIS — K668 Other specified disorders of peritoneum: Secondary | ICD-10-CM | POA: Diagnosis not present

## 2022-03-22 LAB — COMPREHENSIVE METABOLIC PANEL
ALT: 112 U/L — ABNORMAL HIGH (ref 0–44)
AST: 104 U/L — ABNORMAL HIGH (ref 15–41)
Albumin: 2.3 g/dL — ABNORMAL LOW (ref 3.5–5.0)
Alkaline Phosphatase: 166 U/L — ABNORMAL HIGH (ref 38–126)
Anion gap: 6 (ref 5–15)
BUN: 29 mg/dL — ABNORMAL HIGH (ref 6–20)
CO2: 22 mmol/L (ref 22–32)
Calcium: 8.4 mg/dL — ABNORMAL LOW (ref 8.9–10.3)
Chloride: 113 mmol/L — ABNORMAL HIGH (ref 98–111)
Creatinine, Ser: 1.94 mg/dL — ABNORMAL HIGH (ref 0.44–1.00)
GFR, Estimated: 32 mL/min — ABNORMAL LOW (ref >60)
Glucose, Bld: 140 mg/dL — ABNORMAL HIGH (ref 70–99)
Potassium: 4.2 mmol/L (ref 3.5–5.1)
Sodium: 141 mmol/L (ref 135–145)
Total Bilirubin: 0.5 mg/dL (ref 0.3–1.2)
Total Protein: 6.5 g/dL (ref 6.5–8.1)

## 2022-03-22 LAB — CBC
HCT: 28.5 % — ABNORMAL LOW (ref 36.0–46.0)
Hemoglobin: 9.4 g/dL — ABNORMAL LOW (ref 12.0–15.0)
MCH: 28.7 pg (ref 26.0–34.0)
MCHC: 33 g/dL (ref 30.0–36.0)
MCV: 86.9 fL (ref 80.0–100.0)
Platelets: 391 10*3/uL (ref 150–400)
RBC: 3.28 MIL/uL — ABNORMAL LOW (ref 3.87–5.11)
RDW: 14.8 % (ref 11.5–15.5)
WBC: 7.4 10*3/uL (ref 4.0–10.5)
nRBC: 0 % (ref 0.0–0.2)

## 2022-03-22 LAB — TOTAL BILIRUBIN, BODY FLUID: Total bilirubin, fluid: 28.2 mg/dL

## 2022-03-22 MED ORDER — PANTOPRAZOLE SODIUM 40 MG PO TBEC: 40.0000 mg | DELAYED_RELEASE_TABLET | Freq: Two times a day (BID) | ORAL | 0 refills | Status: AC

## 2022-03-22 MED ORDER — SUCRALFATE 1 G PO TABS: 1.0000 g | ORAL_TABLET | Freq: Three times a day (TID) | ORAL | 1 refills | Status: AC

## 2022-03-22 MED ORDER — OXYCODONE HCL 5 MG PO TABS: 5.0000 mg | ORAL_TABLET | ORAL | 0 refills | Status: DC | PRN

## 2022-03-22 MED ORDER — SUCRALFATE 1 G PO TABS
1.0000 g | ORAL_TABLET | Freq: Three times a day (TID) | ORAL | 1 refills | Status: DC
  Filled 2022-03-22: qty 120, 30d supply, fill #0

## 2022-03-22 MED ORDER — CARVEDILOL 6.25 MG PO TABS
6.2500 mg | ORAL_TABLET | Freq: Two times a day (BID) | ORAL | 0 refills | Status: AC
Start: 2022-03-22 — End: ?

## 2022-03-22 MED ORDER — OXYCODONE HCL 5 MG PO TABS
5.0000 mg | ORAL_TABLET | ORAL | 0 refills | Status: DC | PRN
  Filled 2022-03-22 (×2): qty 15, 3d supply, fill #0

## 2022-03-22 MED ORDER — PANTOPRAZOLE SODIUM 40 MG PO TBEC
40.0000 mg | DELAYED_RELEASE_TABLET | Freq: Two times a day (BID) | ORAL | 0 refills | Status: DC
  Filled 2022-03-22: qty 60, 30d supply, fill #0

## 2022-03-22 MED ORDER — AMLODIPINE BESYLATE 10 MG PO TABS
10.0000 mg | ORAL_TABLET | Freq: Every day | ORAL | 1 refills | Status: DC
  Filled 2022-03-22: qty 30, 30d supply, fill #0

## 2022-03-22 NOTE — Discharge Summary (Addendum)
Physician Discharge Summary  Margaret Yoder RKY:[id number] DOB: 13-Jul-1976 DOA: 03/17/2022  PCP: Candi Leash, PA-C  Admit date: 03/17/2022 Discharge date: 03/22/2022  Admitted From: Home Disposition:  Home  Discharge Condition:Stable CODE STATUS:FULL, Diet recommendation: Heart Healthy   Brief/Interim Summary: Patient is a 46 y.o. F with anxiety, HTN, smoking, CKD IV baseline Cr 1.9-2.1 and recent cholecystectomy who presented with recurrent RUQ pain.Had recent uncomplicated cholecystectomy.  After discharge, developed progressively worsening RUQ pain, then vomiting, then "dark stools" then dizziness so she returned to the ER.  GI, general surgery consulted.  CT abdomen/pelvis showed large bilobed fluid collection arising from gallbladder fossa, extending to the inferior/posterior aspect of the left hepatic lobe.  IR consulted for image guided drain placement.  Also underwent EGD on 7/30 which showed gastritis.  Hospital course remarkable for abdomen pain.  HIDA scan on 7/31 showed showed biliary leak.  Underwent ERCP on 8/1 with stent placement.  Gastroenterology cleared her for discharge today.  She will follow-up with GI, general surgery and IR as an outpatient.  Medically stable for discharge.  Following problems were addressed during her hospitalization:  Biloma following surgery: Patient admitted and had percutaneous drain placed 7/29, fluid appeared to be bile, culture no growth.    Antibiotics held and she had no fever or worsening symptoms. HIDA obtained, confirmed bile leak into gallbladder fossa. ERCP on 8/1, confirmed leak, placed stent. Gastroenterology cleared for discharge.  She will be followed by gastroenterology, general surgery as an outpatient.  She will follow-up with IR for drain removal.   Acute kidney injury superimposed on chronic kidney disease ([place]) Baseline Cr 1.9-2.1, presented with Cr 2.7.   Currently improved to baseline with fluids   Melena due to  gastritis, esophagitis Clinically reported melena. EGD 7/30, showed esophagitis, gastritis and duodenitis without ulcer or mass.   Completed 48 hours IV PPI ,transition to pantoprazole PO BID.Also on carafate   Possible goiter During EGD, anesthesia perceived enlargement of the thyroid.   TSH normal, fT4 minimally elevated, low free t3 - Given normal TFTs, would probably recommend f/u with PCP for outpatient thyroid ultrasound - Given her acute illness would probably also repeat TFTs in about 1 month    Acute blood loss anemia Transfused 2 units on admission.Hb stable   CKD (chronic kidney disease), stage IV (H[facility]) Baseline in last 6 months appaers to be in the 1.9-2.1 range.  CKD III ruled out, this would be CKD IV. We recommend to follow-up with nephrology as an outpatient   Essential hypertension On Coreg and amlodipine   Discharge Diagnoses:  Principal Problem:   Biloma following surgery, initial encounter Active Problems:   Acute kidney injury superimposed on chronic kidney disease ([place])   Melena due to gastritis, esophagitis   Essential hypertension   CKD (chronic kidney disease), stage IV (HCC)   Acute blood loss anemia   Chronic pain syndrome   ADHD   Anxiety   Possible goiter    Discharge Instructions  Discharge Instructions     Ambulatory referral to Interventional Radiology   Complete by: As directed    For drain removal   Diet - low sodium heart healthy   Complete by: As directed    Discharge instructions   Complete by: As directed    1)Please take prescribed medications as instructed 2)Follow up with general surgery within a week.  You have appointment on 8/8 3)You would be gastroenterology for follow-up appointment, blood work and further tests 4)Follow up with IR  for drain removal.   Increase activity slowly   Complete by: As directed    No wound care   Complete by: As directed       Allergies as of 03/22/2022   No Known Allergies       Medication List     STOP taking these medications    amLODipine 10 MG tablet Commonly known as: NORVASC       TAKE these medications    carvedilol 6.25 MG tablet Commonly known as: COREG Take 1 tablet (6.25 mg total) by mouth 2 (two) times daily with a meal.   oxyCODONE 5 MG immediate release tablet Commonly known as: Roxicodone Take 1 tablet (5 mg total) by mouth every 4 (four) hours as needed for severe pain.   pantoprazole 40 MG tablet Commonly known as: PROTONIX Take 1 tablet (40 mg total) by mouth 2 (two) times daily before a meal.   sucralfate 1 g tablet Commonly known as: CARAFATE Take 1 tablet (1 g total) by mouth 4 (four) times daily -  with meals and at bedtime.        Follow-up Information     Aviva Signs, MD. Schedule an appointment as soon as possible for a visit on 03/28/2022.   Specialty: General Surgery Why: Follow up drain care. Contact information: [id number] [address] [phone]                No Known Allergies  Consultations: GI, general surgery, IR   Procedures/Studies: DG ERCP  Result Date: 03/21/2022 CLINICAL DATA:  ERCP EXAM: ERCP TECHNIQUE: Multiple spot images obtained with the fluoroscopic device and submitted for interpretation post-procedure. FLUOROSCOPY: Refer to separate report COMPARISON:  None Available. FINDINGS: A total of 16 fluoroscopic spot images and a single fluoroscopic cine loop taken during ERCP are submitted for review. Images demonstrate a scope overlying the upper abdomen. A pigtail catheter is also noted to be in place. Catheterization of the common bile duct is performed. Contrast injection demonstrates a dilated common bile duct. There is additional dilation of the central intrahepatic biliary ducts. Balloon sweep(s) of the common bile duct is performed. A metallic biliary stent is left in place. IMPRESSION: ERCP as described.  Refer to procedure report for full details. These  images were submitted for radiologic interpretation only. Please see the procedural report for the amount of contrast and the fluoroscopy time utilized. Electronically Signed   By: Albin Felling M.D.   On: 03/21/2022 11:53   NM HEPATOBILIARY LEAK (POST-SURGICAL)  Result Date: 03/20/2022 CLINICAL DATA:  Post cholecystectomy, concern for biliary leak. EXAM: NUCLEAR MEDICINE HEPATOBILIARY IMAGING TECHNIQUE: Sequential images of the abdomen were obtained out to 60 minutes following intravenous administration of radiopharmaceutical. RADIOPHARMACEUTICALS:  5.3 mCi Tc-84m  Choletec IV COMPARISON:  CT March 17, 2022 FINDINGS: Prompt uptake and biliary excretion of activity by the liver is seen. Biliary activity passes into small bowel, consistent with patent common bile duct. Focal area radiotracer uptake is seen in the gallbladder fossa and extending behind the porta hepatis towards the left lobe of the liver which appears to correspond with a fluid collection seen on prior CT. IMPRESSION: Focal radiotracer uptake in the gallbladder fossa extending along the porta toward the left lobe of the liver likely corresponding with the fluid collection seen on prior CT and consistent with biliary leak. Electronically Signed   By: Dahlia Bailiff M.D.   On: 03/20/2022 14:25   IR US Guide Bx Asp/Drain  Result Date:  03/18/2022 INDICATION: 46 year old woman with abdominal pain, nausea, vomiting, and upper abdominal fluid collection status post cholecystectomy presents to IR for ultrasound-guided drain placement. EXAM: 1. Ultrasound-guided upper abdominal abscess drain placement 2. Ultrasound-guided aspiration of right upper quadrant collection. MEDICATIONS: Ancef 2 g IV ANESTHESIA/SEDATION: Fentanyl 100 mcg IV; Versed 2 mg IV Moderate Sedation Time:  11 minutes The patient was continuously monitored during the procedure by the interventional radiology nurse under my direct supervision. COMPLICATIONS: None immediate. PROCEDURE:  Informed written consent was obtained from the patient after a thorough discussion of the procedural risks, benefits and alternatives. All questions were addressed. Maximal Sterile Barrier Technique was utilized including caps, mask, sterile gowns, sterile gloves, sterile drape, hand hygiene and skin antiseptic. A timeout was performed prior to the initiation of the procedure. Epigastric and right upper quadrant abdominal wall skin prepped and draped in usual fashion. Following local lidocaine administration, the mid abdominal fluid collection was accessed with a 19 gauge Yueh needle using continuous ultrasound guidance. Yueh catheter exchanged for 10.2 [place] multipurpose pigtail drain over 0.035 inch guidewire. 900 mL of brown cloudy fluid aspirated. Samples were sent for Gram stain and culture. Drain secured to skin with suture and connected to bulb suction. Following the administration, the lenticular fluid collection in the right upper quadrant was accessed with 19 gauge Yueh needle using continuous ultrasound guidance, however nothing could be aspirated. IMPRESSION: 1. 10.2 [place] multipurpose pigtail drain inserted in abdominal fluid collection. 900 mL cloudy brown fluid aspirated. Sample sent for Gram stain and culture. 2. Lingular collection in the right upper quadrant accessed with ultrasound guidance, however no fluid could be aspirated. This is likely a hematoma which has not yet liquified. Electronically Signed   By: Miachel Roux M.D.   On: 03/18/2022 13:00   IR US Guide Bx Asp/Drain  Result Date: 03/18/2022 INDICATION: 46 year old woman with abdominal pain, nausea, vomiting, and upper abdominal fluid collection status post cholecystectomy presents to IR for ultrasound-guided drain placement. EXAM: 1. Ultrasound-guided upper abdominal abscess drain placement 2. Ultrasound-guided aspiration of right upper quadrant collection. MEDICATIONS: Ancef 2 g IV ANESTHESIA/SEDATION: Fentanyl 100 mcg IV; Versed  2 mg IV Moderate Sedation Time:  11 minutes The patient was continuously monitored during the procedure by the interventional radiology nurse under my direct supervision. COMPLICATIONS: None immediate. PROCEDURE: Informed written consent was obtained from the patient after a thorough discussion of the procedural risks, benefits and alternatives. All questions were addressed. Maximal Sterile Barrier Technique was utilized including caps, mask, sterile gowns, sterile gloves, sterile drape, hand hygiene and skin antiseptic. A timeout was performed prior to the initiation of the procedure. Epigastric and right upper quadrant abdominal wall skin prepped and draped in usual fashion. Following local lidocaine administration, the mid abdominal fluid collection was accessed with a 19 gauge Yueh needle using continuous ultrasound guidance. Yueh catheter exchanged for 10.2 [place] multipurpose pigtail drain over 0.035 inch guidewire. 900 mL of brown cloudy fluid aspirated. Samples were sent for Gram stain and culture. Drain secured to skin with suture and connected to bulb suction. Following the administration, the lenticular fluid collection in the right upper quadrant was accessed with 19 gauge Yueh needle using continuous ultrasound guidance, however nothing could be aspirated. IMPRESSION: 1. 10.2 French multipurpose pigtail drain inserted in abdominal fluid collection. 900 mL cloudy brown fluid aspirated. Sample sent for Gram stain and culture. 2. Lingular collection in the right upper quadrant accessed with ultrasound guidance, however no fluid could be aspirated. This is likely a  hematoma which has not yet liquified. Electronically Signed   By: Miachel Roux M.D.   On: 03/18/2022 13:00   DG Chest Port 1 View  Result Date: 03/17/2022 CLINICAL DATA:  Shortness of breath fever EXAM: PORTABLE CHEST 1 VIEW COMPARISON:  03/17/2022, 02/19/2020 FINDINGS: The heart size and mediastinal contours are within normal limits. Both  lungs are clear. The visualized skeletal structures are unremarkable. IMPRESSION: No active disease. Electronically Signed   By: Donavan Foil M.D.   On: 03/17/2022 23:34   CT ABDOMEN PELVIS WO CONTRAST  Result Date: 03/17/2022 CLINICAL DATA:  Abdominal pain, nausea/vomiting, recent cholecystectomy EXAM: CT ABDOMEN AND PELVIS WITHOUT CONTRAST TECHNIQUE: Multidetector CT imaging of the abdomen and pelvis was performed following the standard protocol without IV contrast. RADIATION DOSE REDUCTION: This exam was performed according to the departmental dose-optimization program which includes automated exposure control, adjustment of the mA and/or kV according to patient size and/or use of iterative reconstruction technique. COMPARISON:  Right upper quadrant ultrasound dated 02/21/2022 FINDINGS: Lower chest: Mild lingular scarring. Additional mild subpleural scarring/atelectasis in the lateral left lower lobe. Hepatobiliary: Large bilobed fluid collection likely arising from the gallbladder fossa and extending to the inferior/posterior aspect of the left hepatic lobe, measuring 11.4 x 25.3 x 12.2 cm (coronal image 48), suspicious for biloma in this patient status post recent cholecystectomy. Additional fluid collection/biloma anterior to the right hepatic lobe (series 2/image 30). Mass effect on the liver, which is otherwise unremarkable. Status post cholecystectomy. No intrahepatic or extrahepatic ductal dilatation. Pancreas: Within normal limits. Spleen: Within normal limits. Adrenals/Urinary Tract: Adrenal glands are within normal limits. 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 No renal calculi or hydronephrosis. Bladder is within normal limits. Stomach/Bowel: Mass effect by the dominant fluid collection in the stomach. No evidence of bowel obstruction. Normal appendix (series 2/image 81). No colonic wall thickening or inflammatory changes.  Vascular/Lymphatic: No evidence of abdominal aortic aneurysm. Atherosclerotic calcifications of the abdominal aorta and branch vessels. No suspicious abdominopelvic lymphadenopathy. Reproductive: Uterus of normal limits. Bilateral ovaries are within normal limits. Other: Otherwise, no abdominopelvic ascites. Musculoskeletal: Degenerative changes of the visualized thoracolumbar spine. IMPRESSION: Large bilobed fluid collection likely arising from the gallbladder fossa and extending to the inferior/posterior aspect of the left hepatic lobe, measuring up to 25.3 cm, suspicious for biloma in this patient status post recent cholecystectomy. Additional fluid collection/biloma anterior to the right hepatic lobe. Additional ancillary findings as above. These results were called by telephone at the time of interpretation on 03/17/2022 at 6:35 pm to provider [facility] , who verbally acknowledged these results. Electronically Signed   By: Julian Hy M.D.   On: 03/17/2022 18:40   DG Chest Port 1 View  Result Date: 03/17/2022 CLINICAL DATA:  Shortness of breath EXAM: PORTABLE CHEST 1 VIEW COMPARISON:  02/19/2020 FINDINGS: Lungs are clear.  No pleural effusion or pneumothorax. The heart is normal in size. Metallic device overlying the left neck (incompletely imaged) is likely external to the patient. IMPRESSION: No evidence of acute cardiopulmonary disease. Electronically Signed   By: Julian Hy M.D.   On: 03/17/2022 17:49   US Abdomen Limited RUQ (LIVER/GB)  Result Date: 02/21/2022 CLINICAL DATA:  Right upper quadrant pain. EXAM: ULTRASOUND ABDOMEN LIMITED RIGHT UPPER QUADRANT COMPARISON:  None Available. FINDINGS: Gallbladder: Gallstones evident measuring up to 2.7 cm. Gallbladder wall appears heterogeneous and thickened up to 7 mm. Sonographer reports no sonographic Murphy sign. Common bile duct:  Diameter: 9 mm Liver: Increased echogenicity of liver parenchyma suggests fatty deposition. No focal  abnormality identified. Portal vein is patent on color Doppler imaging with normal direction of blood flow towards the liver. Other: None. IMPRESSION: 1. Cholelithiasis with gallbladder wall thickening. Acute cholecystitis is a consideration. If clinical picture is equivocal, nuclear scintigraphy may prove helpful to assess for cystic duct obstruction. 2. Mild dilatation of the common bile duct, indeterminate. Correlation with liver function tests recommended. MRCP/ERCP may prove helpful to assess for choledocholithiasis. Electronically Signed   By: Misty Stanley M.D.   On: 02/21/2022 10:45      Subjective: Patient seen and examined at the bedside this morning.  Hemodynamically stable.  Overall comfortable, complains of some abdominal discomfort but she states she is okay and wants to go home.  Medically stable for discharge  Discharge Exam: Vitals:   03/22/22 0513 03/22/22 0757  BP: 139/73 (!) 137/92  Pulse: 86 80  Resp: 17 16  Temp: 98.2 F (36.8 C) 98.4 F (36.9 C)  SpO2: 100% 100%   Vitals:   03/21/22 1609 03/21/22 2000 03/22/22 0513 03/22/22 0757  BP: 133/78 124/83 139/73 (!) 137/92  Pulse: 78 87 86 80  Resp: 17 17 17 16   Temp: 98 F (36.7 C) 98.1 F (36.7 C) 98.2 F (36.8 C) 98.4 F (36.9 C)  TempSrc:  Oral Oral Oral  SpO2: 99% 98% 100% 100%  Weight:      Height:        General: Pt is alert, awake, not in acute distress Cardiovascular: RRR, S1/S2 +, no rubs, no gallops Respiratory: CTA bilaterally, no wheezing, no rhonchi Abdominal: Soft, NT, ND, bowel sounds +, drain Extremities: no edema, no cyanosis    The results of significant diagnostics from this hospitalization (including imaging, microbiology, ancillary and laboratory) are listed below for reference.     Microbiology: Recent Results (from the past 240 hour(s))  Aerobic/Anaerobic Culture w Gram Stain (surgical/deep wound)     Status: None (Preliminary result)   Collection Time: 03/18/22 12:37 PM    Specimen: Abscess  Result Value Ref Range Status   Specimen Description ABSCESS  Final   Special Requests ABDOMEN  Final   Gram Stain   Final    NO SQUAMOUS EPITHELIAL CELLS SEEN NO WBC SEEN NO ORGANISMS SEEN Performed at [facility], [address] [place], [place] [address]    Culture   Final    CULTURE REINCUBATED FOR BETTER GROWTH NO ANAEROBES ISOLATED; CULTURE IN PROGRESS FOR 5 DAYS    Report Status PENDING  Incomplete     Labs: BNP (last 3 results) No results for input(s): "BNP" in the last 8760 hours. Basic Metabolic Panel: Recent Labs  Lab 03/18/22 0057 03/19/22 0150 03/20/22 0117 03/21/22 0508 03/22/22 0406  NA 139 141 142 140 141  K 4.5 4.5 4.8 4.6 4.2  CL 111 114* 115* 114* 113*  CO2 19* 20* 19* 20* 22  GLUCOSE 108* 94 112* 86 140*  BUN 97* 65* 42* 31* 29*  CREATININE 2.56* 2.13* 1.89* 1.91* 1.94*  CALCIUM 8.3* 8.4* 8.8* 8.5* 8.4*   Liver Function Tests: Recent Labs  Lab 03/17/22 1611 03/18/22 1122 03/19/22 0150 03/21/22 0508 03/22/22 0406  AST 21 21 23  96* 104*  ALT 30 25 25  73* 112*  ALKPHOS 203* 146* 150* 157* 166*  BILITOT 0.6 1.1 0.5 0.7 0.5  PROT 6.6 6.0* 6.6 6.1* 6.5  ALBUMIN 2.1* 1.9* 2.1* 2.1* 2.3*   Recent Labs  Lab 03/17/22 1611  LIPASE 65*   No results for input(s): "AMMONIA" in the last 168 hours. CBC: Recent Labs  Lab 03/18/22 0057 03/18/22 1122 03/19/22 0150 03/20/22 0117 03/21/22 0508 03/22/22 0406  WBC 8.1  --  7.1 5.9 5.9 7.4  HGB 7.8* 9.7* 9.5* 8.8* 8.6* 9.4*  HCT 23.9* 31.9* 29.7* 26.8* 26.8* 28.5*  MCV 86.9  --  87.4 87.3 88.2 86.9  PLT 305  --  347 348 331 391   Cardiac Enzymes: No results for input(s): "CKTOTAL", "CKMB", "CKMBINDEX", "TROPONINI" in the last 168 hours. BNP: Invalid input(s): "POCBNP" CBG: No results for input(s): "GLUCAP" in the last 168 hours. D-Dimer No results for input(s): "DDIMER" in the last 72 hours. Hgb A1c No results for input(s): "HGBA1C" in the last 72 hours. Lipid  Profile No results for input(s): "CHOL", "HDL", "LDLCALC", "TRIG", "CHOLHDL", "LDLDIRECT" in the last 72 hours. Thyroid function studies Recent Labs    03/20/22 0117  TSH 0.378  T3FREE 1.4*   Anemia work up No results for input(s): "VITAMINB12", "FOLATE", "FERRITIN", "TIBC", "IRON", "RETICCTPCT" in the last 72 hours. Urinalysis    Component Value Date/Time   COLORURINE YELLOW 03/17/2022 [place] 03/17/2022 1604   LABSPEC 1.010 03/17/2022 1604   PHURINE 5.0 03/17/2022 1604   GLUCOSEU NEGATIVE 03/17/2022 1604   HGBUR SMALL (A) 03/17/2022 1604   BILIRUBINUR NEGATIVE 03/17/2022 1604   KETONESUR NEGATIVE 03/17/2022 1604   PROTEINUR 30 (A) 03/17/2022 1604   UROBILINOGEN 0.2 11/05/2014 1800   NITRITE NEGATIVE 03/17/2022 1604   LEUKOCYTESUR NEGATIVE 03/17/2022 1604   Sepsis Labs Recent Labs  Lab 03/19/22 0150 03/20/22 0117 03/21/22 0508 03/22/22 0406  WBC 7.1 5.9 5.9 7.4   Microbiology Recent Results (from the past 240 hour(s))  Aerobic/Anaerobic Culture w Gram Stain (surgical/deep wound)     Status: None (Preliminary result)   Collection Time: 03/18/22 12:37 PM   Specimen: Abscess  Result Value Ref Range Status   Specimen Description ABSCESS  Final   Special Requests ABDOMEN  Final   Gram Stain   Final    NO SQUAMOUS EPITHELIAL CELLS SEEN NO WBC SEEN NO ORGANISMS SEEN Performed at [facility], [address] [place], [place] [address]    Culture   Final    CULTURE REINCUBATED FOR BETTER GROWTH NO ANAEROBES ISOLATED; CULTURE IN PROGRESS FOR 5 DAYS    Report Status PENDING  Incomplete    Please note: You were cared for by a hospitalist during your hospital stay. Once you are discharged, your primary care physician will handle any further medical issues. Please note that NO REFILLS for any discharge medications will be authorized once you are discharged, as it is imperative that you return to your primary care physician (or establish a  relationship with a primary care physician if you do not have one) for your post hospital discharge needs so that they can reassess your need for medications and monitor your lab values.    Time coordinating discharge: 40 minutes  SIGNED:   Shelly Coss, MD  [facility] 03/22/2022, 12:53 PM Pager [phone]  If 7PM-7AM, please contact night-coverage [email or web address] Password [email or web address]

## 2022-03-22 NOTE — Progress Notes (Signed)
Pt discharged to home. DC instructions given with son at bedside. No concerns voiced. Asked for discharge meds to be sent to the [facility] pharmacy in [place] instead of [facility]. Provider made aware. Left unit in wheelchair pushed by hospital volunteer accompanied by son. Left in stable condition.

## 2022-03-22 NOTE — Progress Notes (Addendum)
Daily Rounding Note  03/22/2022, 8:19 AM  LOS: 5 days   SUBJECTIVE:   Chief complaint: bile leak.  GIB w blood loss anemia     Patient is feeling great.  She really wants to go home.  Appetite still diminished but no nausea, vomiting or significant abdominal pain.  Tells me that she was supposed to go back to work middle of last week after she had recovered from her surgery.  However she got sick and ended up in the hospital and she is wondering when she can go back to work. Her biliary drain is in place.  She says since it was emptied last night that a minor amount of fluid has accumulated.  OBJECTIVE:         Vital signs in last 24 hours:    Temp:  [97.4 F (36.3 C)-98.4 F (36.9 C)] 98.4 F (36.9 C) (08/02 0757) Pulse Rate:  [64-87] 80 (08/02 0757) Resp:  [15-18] 16 (08/02 0757) BP: (122-166)/(71-99) 137/92 (08/02 0757) SpO2:  [98 %-100 %] 100 % (08/02 0757) Weight:  [76.7 kg] 76.7 kg (08/01 1002) Last BM Date : 03/19/22 Filed Weights   03/17/22 1558 03/21/22 1002  Weight: 76.7 kg 76.7 kg   General: Looks better.  In good spirits.  Does not look ill. Heart: RRR. Chest: Clear bilaterally.  No labored breathing or cough Abdomen: Soft without tenderness.  Surgical incision sites bandaged with no blood on bandages.  JP bulb from biloma drain has about 10 cc of somewhat cloudy bilious yellow looking liquid. Extremities: No CCE. Neuro/Psych: Pleasant, calm, animated.  Fully oriented.  No deficits.  Intake/Output from previous day: 08/01 0701 - 08/02 0700 In: 1310 [P.O.:510; I.V.:700; IV Piggyback:100] Out: 100 [Drains:100]  Intake/Output this shift: No intake/output data recorded.  Lab Results: Recent Labs    03/20/22 0117 03/21/22 0508 03/22/22 0406  WBC 5.9 5.9 7.4  HGB 8.8* 8.6* 9.4*  HCT 26.8* 26.8* 28.5*  PLT 348 331 391   BMET Recent Labs    03/20/22 0117 03/21/22 0508 03/22/22 0406  NA 142 140  141  K 4.8 4.6 4.2  CL 115* 114* 113*  CO2 19* 20* 22  GLUCOSE 112* 86 140*  BUN 42* 31* 29*  CREATININE 1.89* 1.91* 1.94*  CALCIUM 8.8* 8.5* 8.4*   LFT Recent Labs    03/21/22 0508 03/22/22 0406  PROT 6.1* 6.5  ALBUMIN 2.1* 2.3*  AST 96* 104*  ALT 73* 112*  ALKPHOS 157* 166*  BILITOT 0.7 0.5   PT/INR No results for input(s): "LABPROT", "INR" in the last 72 hours. Hepatitis Panel No results for input(s): "HEPBSAG", "HCVAB", "HEPAIGM", "HEPBIGM" in the last 72 hours.  Studies/Results: DG ERCP  Result Date: 03/21/2022 CLINICAL DATA:  ERCP EXAM: ERCP TECHNIQUE: Multiple spot images obtained with the fluoroscopic device and submitted for interpretation post-procedure. FLUOROSCOPY: Refer to separate report COMPARISON:  None Available. FINDINGS: A total of 16 fluoroscopic spot images and a single fluoroscopic cine loop taken during ERCP are submitted for review. Images demonstrate a scope overlying the upper abdomen. A pigtail catheter is also noted to be in place. Catheterization of the common bile duct is performed. Contrast injection demonstrates a dilated common bile duct. There is additional dilation of the central intrahepatic biliary ducts. Balloon sweep(s) of the common bile duct is performed. A metallic biliary stent is left in place. IMPRESSION: ERCP as described.  Refer to procedure report for full details. These images  were submitted for radiologic interpretation only. Please see the procedural report for the amount of contrast and the fluoroscopy time utilized. Electronically Signed   By: Albin Felling M.D.   On: 03/21/2022 11:53   NM HEPATOBILIARY LEAK (POST-SURGICAL)  Result Date: 03/20/2022 CLINICAL DATA:  Post cholecystectomy, concern for biliary leak. EXAM: NUCLEAR MEDICINE HEPATOBILIARY IMAGING TECHNIQUE: Sequential images of the abdomen were obtained out to 60 minutes following intravenous administration of radiopharmaceutical. RADIOPHARMACEUTICALS:  5.3 mCi Tc-43m   Choletec IV COMPARISON:  CT March 17, 2022 FINDINGS: Prompt uptake and biliary excretion of activity by the liver is seen. Biliary activity passes into small bowel, consistent with patent common bile duct. Focal area radiotracer uptake is seen in the gallbladder fossa and extending behind the porta hepatis towards the left lobe of the liver which appears to correspond with a fluid collection seen on prior CT. IMPRESSION: Focal radiotracer uptake in the gallbladder fossa extending along the porta toward the left lobe of the liver likely corresponding with the fluid collection seen on prior CT and consistent with biliary leak. Electronically Signed   By: Dahlia Bailiff M.D.   On: 03/20/2022 14:25    ASSESMENT:     Bile leak, biloma after 7/6 lap chole.  7/29 perc aspiration/drain placement.  03/21/22 ERCP: congested major papilla, dilated CBD, bile leak, sphincterotomy, sludge/debris noted on balloon sweep, covered metal stent placed to CBD.  LFTs bumped up last 2 days.  Drain output declining, 230 mL and 100 mL previous 2 days.         GIB, melena, ABL anemia.  02/20/2022 EGD.  Severe esophagitis with bleeding.  Suspicion of Barrett's, gastric deformity from extrinsic compression, erosive gastritis, duodenal erythema, bulbous major papilla.  Gastric pathology with mild active chronic gastritis, foveolar hyperplasia.  Stains negative for H. pylori.  Esophageal pathology with squamocolumnar mucosa with active chronic inflammation and reactive changes.  No intestinal metaplasia, malignancy, viral changes, H. pylori or fungi.  S/p 2 PRBCs.  Hgb stable and rising in last 3 days.  Renal dz contributing to anemia.     CKD stage 4.     PLAN   From GI standpoint she is okay to go home.  I think the 1 question that needs to be answers is when the biloma drain can come out.  ERCP w EGD in 6 to 10 weeks.  Dr Jerilynn Mages will orchestrate fup and notify patient.  I advised her to download the [place] so that she  could get any necessary email/notifications PO she will also be notified by [place] mail.  She says her cell phone coverage where she lives is unreliable..  Protonix or equivalent full dose PPI 40 mg po bid.     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  Shoul get LFTs early to mid next week to assess they are improving.  ? How to arrange a lab visit near her home in [place] ( ~ [place], [place]).     Margaret Yoder  03/22/2022, 8:19 AM Phone [phone]

## 2022-03-23 LAB — AEROBIC/ANAEROBIC CULTURE W GRAM STAIN (SURGICAL/DEEP WOUND): Gram Stain: NONE SEEN

## 2022-03-27 ENCOUNTER — Other Ambulatory Visit: Payer: Self-pay | Admitting: General Surgery

## 2022-03-27 ENCOUNTER — Other Ambulatory Visit: Payer: Self-pay

## 2022-03-27 DIAGNOSIS — K839 Disease of biliary tract, unspecified: Secondary | ICD-10-CM

## 2022-03-27 DIAGNOSIS — K668 Other specified disorders of peritoneum: Secondary | ICD-10-CM

## 2022-03-27 NOTE — Telephone Encounter (Signed)
Tried to reach the pt by phone and no answer and no voice mail

## 2022-03-27 NOTE — Telephone Encounter (Signed)
ERCP scheduled for 9/21 at 1145 am at [facility] with GM for bile leak.

## 2022-03-28 ENCOUNTER — Encounter: Payer: Self-pay | Admitting: *Deleted

## 2022-03-28 ENCOUNTER — Ambulatory Visit (INDEPENDENT_AMBULATORY_CARE_PROVIDER_SITE_OTHER): Payer: PRIVATE HEALTH INSURANCE | Admitting: General Surgery

## 2022-03-28 ENCOUNTER — Encounter: Payer: Self-pay | Admitting: General Surgery

## 2022-03-28 VITALS — BP 136/87 | HR 81 | Temp 97.7°F | Resp 16 | Ht 63.0 in | Wt 149.0 lb

## 2022-03-28 DIAGNOSIS — Z09 Encounter for follow-up examination after completed treatment for conditions other than malignant neoplasm: Secondary | ICD-10-CM

## 2022-03-28 NOTE — Progress Notes (Signed)
Subjective:     Margaret Yoder  Patient here for postoperative visit, status post laparoscopic cholecystectomy with postoperative bile leak requiring ERCP with stent placement and percutaneous abdominal fluid drainage.  Since she has been discharged, her appetite has returned.  She is having normal bowel movements.  She denies any fever or chills.  She does have a percutaneous abdominal drain in place which she is putting out approximately 50 cc of clear yellow fluid daily.  No purulent drainage has been noted.  She does have some tiredness, but this is somewhat improving. Objective:    BP 136/87   Pulse 81   Temp 97.7 F (36.5 C) (Oral)   Resp 16   Ht 5\' 3"  (1.6 m)   Wt 149 lb (67.6 kg)   SpO2 98%   BMI 26.39 kg/m   General:  alert, cooperative, and no distress  Abdomen is soft, incisions healing well.  No rigidity is noted.  Pigtail catheter drain in place upper mid abdomen.     Assessment:    Doing well postoperatively. Status post ERCP with stent placement    Plan:   I went over with the patient why she had a bile leak.  This appears to be a possible assess for any duct leak.  She does have an appointment to see interventional radiology for follow-up of her pigtail catheter drain on 04/07/2022.  She does have some FMLA papers that need to be filled out and I told her we would do that.  We will follow her expectantly.  She was instructed to call me should any problems arise.

## 2022-03-28 NOTE — Telephone Encounter (Signed)
Left message on machine to call back  

## 2022-03-29 NOTE — Telephone Encounter (Signed)
ERCP scheduled, pt instructed and medications reviewed.  Patient instructions mailed to home and sent to My Chart.  Patient to call with any questions or concerns.  

## 2022-04-07 ENCOUNTER — Ambulatory Visit
Admission: RE | Admit: 2022-04-07 | Discharge: 2022-04-07 | Disposition: A | Discharge status: Home / Self Care | Payer: PRIVATE HEALTH INSURANCE | Source: Ambulatory Visit | Attending: General Surgery | Admitting: General Surgery

## 2022-04-07 ENCOUNTER — Ambulatory Visit
Admission: RE | Admit: 2022-04-07 | Discharge: 2022-04-07 | Disposition: A | Discharge status: Home / Self Care | Payer: PRIVATE HEALTH INSURANCE | Source: Ambulatory Visit | Attending: Physician Assistant | Admitting: Physician Assistant

## 2022-04-07 ENCOUNTER — Other Ambulatory Visit (HOSPITAL_COMMUNITY): Payer: Self-pay | Admitting: Radiology

## 2022-04-07 DIAGNOSIS — K668 Other specified disorders of peritoneum: Secondary | ICD-10-CM

## 2022-04-07 HISTORY — PX: IR RADIOLOGIST EVAL & MGMT: IMG5224

## 2022-04-07 MED ORDER — NORMAL SALINE FLUSH 0.9 % IV SOLN: 10.0000 mL | Freq: Every day | INTRAVENOUS | 0 refills | Status: AC

## 2022-04-07 NOTE — Progress Notes (Signed)
Chief Complaint: Patient was seen in follow up for a percutaneous biloma drain.   History of Present Illness: Margaret Yoder is a 46 y.o. female status post cholecystectomy for cholelithiasis and acute cholecystitis on 02/22/2022. She presented on 03/17/2022 with abdominal pain and a large bilobed fluid collection along the undersurface of the liver suspicious for biloma.  She underwent percutaneous drain placement in the dominant portion of the biloma adjacent to the left lobe of the liver on 03/18/2022. A nuclear medicine study on 03/20/2022 was positive for bile leak. She underwent ERCP on 03/21/2022 by Dr. Rush Landmark who placed a metallic covered stent in the common bile duct to treat bile leak.  She had a follow-up appointment with Dr. Arnoldo Morale on 03/28/2022.  Recently, she has had only roughly 10 mL of bilious output via the drain over the last 24 hours.  The drain remains attached to a suction bulb.  She is not flushing the drain at home.  She denies fever.  She has had persistent nausea and lack of appetite.  She has a follow-up appointment with Dr. Rush Landmark next month.  Past Medical History:  Diagnosis Date   Anxiety    Chronic back pain    Depression    Gout    Hypertension     Past Surgical History:  Procedure Laterality Date   BILIARY STENT PLACEMENT  03/21/2022   Procedure: BILIARY STENT PLACEMENT;  Surgeon: Irving Copas., MD;  Location: [place];  Service: Gastroenterology;;   BIOPSY  03/19/2022   Procedure: BIOPSY;  Surgeon: Irving Copas., MD;  Location: [place];  Service: Gastroenterology;;   CHOLECYSTECTOMY N/A 02/22/2022   Procedure: LAPAROSCOPIC CHOLECYSTECTOMY;  Surgeon: Aviva Signs, MD;  Location: [facility];  Service: General;  Laterality: N/A;   ERCP N/A 03/21/2022   Procedure: ENDOSCOPIC RETROGRADE CHOLANGIOPANCREATOGRAPHY (ERCP);  Surgeon: Irving Copas., MD;  Location: [place];  Service: Gastroenterology;  Laterality: N/A;    ESOPHAGOGASTRODUODENOSCOPY (EGD) WITH PROPOFOL N/A 03/19/2022   Procedure: ESOPHAGOGASTRODUODENOSCOPY (EGD) WITH PROPOFOL;  Surgeon: Rush Landmark Telford Nab., MD;  Location: [place];  Service: Gastroenterology;  Laterality: N/A;   IR RADIOLOGIST EVAL & MGMT  04/07/2022   IR US GUIDE BX ASP/DRAIN  03/18/2022   IR US GUIDE BX ASP/DRAIN  03/18/2022   REMOVAL OF STONES  03/21/2022   Procedure: REMOVAL OF STONES;  Surgeon: Irving Copas., MD;  Location: [place];  Service: Gastroenterology;;   Joan Mayans  03/21/2022   Procedure: Joan Mayans;  Surgeon: Mansouraty, Telford Nab., MD;  Location: [place];  Service: Gastroenterology;;   VIDEO BRONCHOSCOPY N/A 11/11/2014   Procedure: VIDEO BRONCHOSCOPY;  Surgeon: Ivin Poot, MD;  Location: [facility];  Service: Thoracic;  Laterality: N/A;    Allergies: Patient has no known allergies.  Medications: Prior to Admission medications   Medication Sig Start Date End Date Taking? Authorizing Provider  carvedilol (COREG) 6.25 MG tablet Take 1 tablet (6.25 mg total) by mouth 2 (two) times daily with a meal. 03/22/22   Shelly Coss, MD  oxyCODONE (ROXICODONE) 5 MG immediate release tablet Take 1 tablet (5 mg total) by mouth every 4 (four) hours as needed for severe pain. 03/22/22   Shelly Coss, MD  pantoprazole (PROTONIX) 40 MG tablet Take 1 tablet (40 mg total) by mouth 2 (two) times daily before a meal. 03/22/22   Shelly Coss, MD  Sodium Chloride Flush (NORMAL SALINE FLUSH) 0.9 % SOLN Inject 10 mLs into the vein daily. 04/07/22   Jacqualine Mau,  NP  sucralfate (CARAFATE) 1 g tablet Take 1 tablet (1 g total) by mouth 4 (four) times daily -  with meals and at bedtime. 03/22/22   Shelly Coss, MD     Family History  Family history unknown: Yes    Social History   Socioeconomic History   Marital status: Legally Separated    Spouse name: Not on file   Number of children: Not on file   Years of education: Not on file    Highest education level: Not on file  Occupational History   Not on file  Tobacco Use   Smoking status: Every Day    Packs/day: 1.00    Types: Cigarettes   Smokeless tobacco: Never  Substance and Sexual Activity   Alcohol use: No   Drug use: No   Sexual activity: Not on file  Other Topics Concern   Not on file  Social History Narrative   Not on file   Social Determinants of Health   Financial Resource Strain: Not on file  Food Insecurity: Not on file  Transportation Needs: Not on file  Physical Activity: Not on file  Stress: Not on file  Social Connections: Not on file    Review of Systems: A 12 point ROS discussed and pertinent positives are indicated in the HPI above.  All other systems are negative.  Review of Systems  Constitutional:  Positive for appetite change.  Respiratory: Negative.    Cardiovascular: Negative.   Gastrointestinal:  Positive for nausea. Negative for abdominal pain, blood in stool and diarrhea.  Genitourinary: Negative.   Musculoskeletal: Negative.   Neurological: Negative.     Vital Signs: BP (!) 94/58   Pulse 85   Temp 98.4 F (36.9 C)   SpO2 99%     Physical Exam Vitals reviewed.  Constitutional:      General: She is not in acute distress.    Appearance: She is not ill-appearing, toxic-appearing or diaphoretic.  Abdominal:     General: There is no distension.     Palpations: Abdomen is soft.     Tenderness: There is no abdominal tenderness.     Comments: Drain exit site clean and dry.  No drainage or erythema.  Exiting drainage catheter intact.  Tan colored fluid in suction bulb.  Neurological:     General: No focal deficit present.     Mental Status: She is alert and oriented to person, place, and time.      Imaging: DG Sinus/Fist Tube Chk-Non GI  Result Date: 04/07/2022 INDICATION: Status post percutaneous catheter drainage of large perihepatic biloma on 03/18/2022. CT performed today demonstrates decompression of the  dominant drained biloma along the undersurface of the left lobe of the liver. There remains component of complex fluid near the gallbladder fossa which may be in communication with the drained collection. There also is evidence of migration of a previously placed common bile duct metallic stent which is now located in the colon. EXAM: INJECTION OF INDWELLING PERCUTANEOUS DRAINAGE CATHETER UNDER FLUOROSCOPY MEDICATIONS: None ANESTHESIA/SEDATION: None COMPLICATIONS: None immediate. PROCEDURE: Informed written consent was obtained from the patient after a thorough discussion of the procedural risks, benefits and alternatives. All questions were addressed. Maximal Sterile Barrier Technique was utilized including caps, mask, sterile gowns, sterile gloves, sterile drape, hand hygiene and skin antiseptic. A timeout was performed prior to the initiation of the procedure. The pre-existing drainage catheter was injected with contrast material under fluoroscopy and fluoroscopic cine loop and images saved. The injected contrast  material was then aspirated, the drainage catheter flushed with sterile saline and reconnected to a suction bulb. FINDINGS: Injection of the midline biloma drain demonstrates opacification of an irregular shaped cavity adjacent to the pigtail portion of the drain with no evidence fistula to the biliary tree or bowel. The additional complex portions seen by CT near the gallbladder fossa does not fill with contrast or communicate by injection of contrast. The patient was also rolled up on her side with the right side down to try to encourage contrast communication and there was no filling of this additional cavity. At this point, this region may be not be liquefied. IMPRESSION: Irregular cavity opacification with injection of the biloma drain. No fistula is demonstrated to the biliary tree or bowel. No communication is identified to the additional component of residual complex fluid seen by CT at the level  of the gallbladder fossa. The drainage catheter will be left in place and was reattached to a suction bulb. Please see separately dictated progress note in Epic. Electronically Signed   By: Aletta Edouard M.D.   On: 04/07/2022 15:16   CT ABDOMEN PELVIS WO CONTRAST  Result Date: 04/07/2022 CLINICAL DATA:  Status post percutaneous catheter drainage large biloma centered along the undersurface of the left lobe of the liver with communication to second collection near the gallbladder fossa on 03/18/2022. Additional endoscopic metallic stent placement in common bile duct on 03/21/2022 to treat bile leak. EXAM: CT ABDOMEN AND PELVIS WITHOUT CONTRAST TECHNIQUE: Multidetector CT imaging of the abdomen and pelvis was performed following the standard protocol without IV contrast. RADIATION DOSE REDUCTION: This exam was performed according to the departmental dose-optimization program which includes automated exposure control, adjustment of the mA and/or kV according to patient size and/or use of iterative reconstruction technique. COMPARISON:  03/17/2022 FINDINGS: Lower chest: No acute abnormality. Hepatobiliary: The dominant component of biloma along the deep aspect of the left lobe of the liver containing a percutaneous drainage catheter shows dramatic decompression since the pre-procedural CT. Component measuring previously nearly 17 cm in diameter now demonstrates a small amount of residual fluid around the pigtail portion of the drain measuring up to approximately 3.5 cm. The communicating collection along the undersurface of the right lobe and extending into the gallbladder fossa previously measured approximately 11 cm in greatest diameter. There is some residual complex fluid and a small amount of air remaining in this region which actually may be largely subcapsular. On coronal images this measures roughly 7 cm in greatest oblique diameter. This does not have the appearance of simple fluid and demonstrates  internal density of up to 27 Hounsfield units. The previously placed metallic common bile duct stent has completely migrated out of the common bile duct and is now within the ascending colon near the hepatic flexure. There is some pneumobilia and left lobe ducts. No biliary ductal dilatation identified. Pancreas: Unremarkable. No pancreatic ductal dilatation or surrounding inflammatory changes. Spleen: Normal in size without focal abnormality. Adrenals/Urinary Tract: Adrenal glands are unremarkable. 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 The bladder is decompressed. Stomach/Bowel: No evidence of bowel obstruction, ileus or free intraperitoneal air. The appendix is normal. As mentioned above, the metallic biliary stent has migrated into the ascending colon to the level of the hepatic flexure. This is not causing any visible inflammation of bowel or bowel obstruction. Vascular/Lymphatic: Minimal atherosclerosis of the abdominal aorta and iliac arteries without aneurysm. No enlarged lymph nodes. Reproductive: Uterus and bilateral adnexa are unremarkable.  Other: No abdominal wall hernia or abnormality. No abdominopelvic ascites. Musculoskeletal: No acute or significant osseous findings. IMPRESSION: 1. The dominant component of biloma along the deep aspect of the left lobe of the liver containing a percutaneous drainage catheter shows dramatic decompression since the pre-procedural CT. Component measuring previously nearly 17 cm in diameter now demonstrates a small amount of residual fluid around the pigtail portion of the drain measuring up to approximately 3.5 cm. The communicating collection along the undersurface of the right lobe and extending into the gallbladder fossa previously measured approximately 11 cm in greatest diameter. There is some residual complex fluid and a small amount of air remaining in this region measuring up to 7 cm in oblique diameter. 2. Migration of metallic common  bile duct stent which is now located in the ascending colon at the level of the hepatic flexure. No associated bowel obstruction or inflammation. Electronically Signed   By: Aletta Edouard M.D.   On: 04/07/2022 14:05   IR Radiologist Eval & Mgmt  Result Date: 04/07/2022 Please refer to notes tab for details about interventional procedure. (Op Note)  DG ERCP  Result Date: 03/21/2022 CLINICAL DATA:  ERCP EXAM: ERCP TECHNIQUE: Multiple spot images obtained with the fluoroscopic device and submitted for interpretation post-procedure. FLUOROSCOPY: Refer to separate report COMPARISON:  None Available. FINDINGS: A total of 16 fluoroscopic spot images and a single fluoroscopic cine loop taken during ERCP are submitted for review. Images demonstrate a scope overlying the upper abdomen. A pigtail catheter is also noted to be in place. Catheterization of the common bile duct is performed. Contrast injection demonstrates a dilated common bile duct. There is additional dilation of the central intrahepatic biliary ducts. Balloon sweep(s) of the common bile duct is performed. A metallic biliary stent is left in place. IMPRESSION: ERCP as described.  Refer to procedure report for full details. These images were submitted for radiologic interpretation only. Please see the procedural report for the amount of contrast and the fluoroscopy time utilized. Electronically Signed   By: Albin Felling M.D.   On: 03/21/2022 11:53   NM HEPATOBILIARY LEAK (POST-SURGICAL)  Result Date: 03/20/2022 CLINICAL DATA:  Post cholecystectomy, concern for biliary leak. EXAM: NUCLEAR MEDICINE HEPATOBILIARY IMAGING TECHNIQUE: Sequential images of the abdomen were obtained out to 60 minutes following intravenous administration of radiopharmaceutical. RADIOPHARMACEUTICALS:  5.3 mCi Tc-77m  Choletec IV COMPARISON:  CT March 17, 2022 FINDINGS: Prompt uptake and biliary excretion of activity by the liver is seen. Biliary activity passes into small  bowel, consistent with patent common bile duct. Focal area radiotracer uptake is seen in the gallbladder fossa and extending behind the porta hepatis towards the left lobe of the liver which appears to correspond with a fluid collection seen on prior CT. IMPRESSION: Focal radiotracer uptake in the gallbladder fossa extending along the porta toward the left lobe of the liver likely corresponding with the fluid collection seen on prior CT and consistent with biliary leak. Electronically Signed   By: Dahlia Bailiff M.D.   On: 03/20/2022 14:25   IR US Guide Bx Asp/Drain  Result Date: 03/18/2022 INDICATION: 46 year old woman with abdominal pain, nausea, vomiting, and upper abdominal fluid collection status post cholecystectomy presents to IR for ultrasound-guided drain placement. EXAM: 1. Ultrasound-guided upper abdominal abscess drain placement 2. Ultrasound-guided aspiration of right upper quadrant collection. MEDICATIONS: Ancef 2 g IV ANESTHESIA/SEDATION: Fentanyl 100 mcg IV; Versed 2 mg IV Moderate Sedation Time:  11 minutes The patient was continuously monitored during the procedure  by the interventional radiology nurse under my direct supervision. COMPLICATIONS: None immediate. PROCEDURE: Informed written consent was obtained from the patient after a thorough discussion of the procedural risks, benefits and alternatives. All questions were addressed. Maximal Sterile Barrier Technique was utilized including caps, mask, sterile gowns, sterile gloves, sterile drape, hand hygiene and skin antiseptic. A timeout was performed prior to the initiation of the procedure. Epigastric and right upper quadrant abdominal wall skin prepped and draped in usual fashion. Following local lidocaine administration, the mid abdominal fluid collection was accessed with a 19 gauge Yueh needle using continuous ultrasound guidance. Yueh catheter exchanged for 10.2 [place] multipurpose pigtail drain over 0.035 inch guidewire. 900 mL of  brown cloudy fluid aspirated. Samples were sent for Gram stain and culture. Drain secured to skin with suture and connected to bulb suction. Following the administration, the lenticular fluid collection in the right upper quadrant was accessed with 19 gauge Yueh needle using continuous ultrasound guidance, however nothing could be aspirated. IMPRESSION: 1. 10.2 French multipurpose pigtail drain inserted in abdominal fluid collection. 900 mL cloudy brown fluid aspirated. Sample sent for Gram stain and culture. 2. Lingular collection in the right upper quadrant accessed with ultrasound guidance, however no fluid could be aspirated. This is likely a hematoma which has not yet liquified. Electronically Signed   By: Miachel Roux M.D.   On: 03/18/2022 13:00   IR US Guide Bx Asp/Drain  Result Date: 03/18/2022 INDICATION: 46 year old woman with abdominal pain, nausea, vomiting, and upper abdominal fluid collection status post cholecystectomy presents to IR for ultrasound-guided drain placement. EXAM: 1. Ultrasound-guided upper abdominal abscess drain placement 2. Ultrasound-guided aspiration of right upper quadrant collection. MEDICATIONS: Ancef 2 g IV ANESTHESIA/SEDATION: Fentanyl 100 mcg IV; Versed 2 mg IV Moderate Sedation Time:  11 minutes The patient was continuously monitored during the procedure by the interventional radiology nurse under my direct supervision. COMPLICATIONS: None immediate. PROCEDURE: Informed written consent was obtained from the patient after a thorough discussion of the procedural risks, benefits and alternatives. All questions were addressed. Maximal Sterile Barrier Technique was utilized including caps, mask, sterile gowns, sterile gloves, sterile drape, hand hygiene and skin antiseptic. A timeout was performed prior to the initiation of the procedure. Epigastric and right upper quadrant abdominal wall skin prepped and draped in usual fashion. Following local lidocaine administration, the  mid abdominal fluid collection was accessed with a 19 gauge Yueh needle using continuous ultrasound guidance. Yueh catheter exchanged for 10.2 [place] multipurpose pigtail drain over 0.035 inch guidewire. 900 mL of brown cloudy fluid aspirated. Samples were sent for Gram stain and culture. Drain secured to skin with suture and connected to bulb suction. Following the administration, the lenticular fluid collection in the right upper quadrant was accessed with 19 gauge Yueh needle using continuous ultrasound guidance, however nothing could be aspirated. IMPRESSION: 1. 10.2 French multipurpose pigtail drain inserted in abdominal fluid collection. 900 mL cloudy brown fluid aspirated. Sample sent for Gram stain and culture. 2. Lingular collection in the right upper quadrant accessed with ultrasound guidance, however no fluid could be aspirated. This is likely a hematoma which has not yet liquified. Electronically Signed   By: Miachel Roux M.D.   On: 03/18/2022 13:00   DG Chest Port 1 View  Result Date: 03/17/2022 CLINICAL DATA:  Shortness of breath fever EXAM: PORTABLE CHEST 1 VIEW COMPARISON:  03/17/2022, 02/19/2020 FINDINGS: The heart size and mediastinal contours are within normal limits. Both lungs are clear. The visualized skeletal structures are unremarkable.  IMPRESSION: No active disease. Electronically Signed   By: Donavan Foil M.D.   On: 03/17/2022 23:34   CT ABDOMEN PELVIS WO CONTRAST  Result Date: 03/17/2022 CLINICAL DATA:  Abdominal pain, nausea/vomiting, recent cholecystectomy EXAM: CT ABDOMEN AND PELVIS WITHOUT CONTRAST TECHNIQUE: Multidetector CT imaging of the abdomen and pelvis was performed following the standard protocol without IV contrast. RADIATION DOSE REDUCTION: This exam was performed according to the departmental dose-optimization program which includes automated exposure control, adjustment of the mA and/or kV according to patient size and/or use of iterative reconstruction technique.  COMPARISON:  Right upper quadrant ultrasound dated 02/21/2022 FINDINGS: Lower chest: Mild lingular scarring. Additional mild subpleural scarring/atelectasis in the lateral left lower lobe. Hepatobiliary: Large bilobed fluid collection likely arising from the gallbladder fossa and extending to the inferior/posterior aspect of the left hepatic lobe, measuring 11.4 x 25.3 x 12.2 cm (coronal image 48), suspicious for biloma in this patient status post recent cholecystectomy. Additional fluid collection/biloma anterior to the right hepatic lobe (series 2/image 30). Mass effect on the liver, which is otherwise unremarkable. Status post cholecystectomy. No intrahepatic or extrahepatic ductal dilatation. Pancreas: Within normal limits. Spleen: Within normal limits. Adrenals/Urinary Tract: Adrenal glands are within normal limits. 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 No renal calculi or hydronephrosis. Bladder is within normal limits. Stomach/Bowel: Mass effect by the dominant fluid collection in the stomach. No evidence of bowel obstruction. Normal appendix (series 2/image 81). No colonic wall thickening or inflammatory changes. Vascular/Lymphatic: No evidence of abdominal aortic aneurysm. Atherosclerotic calcifications of the abdominal aorta and branch vessels. No suspicious abdominopelvic lymphadenopathy. Reproductive: Uterus of normal limits. Bilateral ovaries are within normal limits. Other: Otherwise, no abdominopelvic ascites. Musculoskeletal: Degenerative changes of the visualized thoracolumbar spine. IMPRESSION: Large bilobed fluid collection likely arising from the gallbladder fossa and extending to the inferior/posterior aspect of the left hepatic lobe, measuring up to 25.3 cm, suspicious for biloma in this patient status post recent cholecystectomy. Additional fluid collection/biloma anterior to the right hepatic lobe. Additional ancillary findings as  above. These results were called by telephone at the time of interpretation on 03/17/2022 at 6:35 pm to provider [facility] , who verbally acknowledged these results. Electronically Signed   By: Julian Hy M.D.   On: 03/17/2022 18:40   DG Chest Port 1 View  Result Date: 03/17/2022 CLINICAL DATA:  Shortness of breath EXAM: PORTABLE CHEST 1 VIEW COMPARISON:  02/19/2020 FINDINGS: Lungs are clear.  No pleural effusion or pneumothorax. The heart is normal in size. Metallic device overlying the left neck (incompletely imaged) is likely external to the patient. IMPRESSION: No evidence of acute cardiopulmonary disease. Electronically Signed   By: Julian Hy M.D.   On: 03/17/2022 17:49    Labs:  CBC: Recent Labs    03/19/22 0150 03/20/22 0117 03/21/22 0508 03/22/22 0406  WBC 7.1 5.9 5.9 7.4  HGB 9.5* 8.8* 8.6* 9.4*  HCT 29.7* 26.8* 26.8* 28.5*  PLT 347 348 331 391    COAGS: Recent Labs    02/21/22 0450 03/18/22 0057  INR  --  1.3*  APTT 29  --     BMP: Recent Labs    03/19/22 0150 03/20/22 0117 03/21/22 0508 03/22/22 0406  NA 141 142 140 141  K 4.5 4.8 4.6 4.2  CL 114* 115* 114* 113*  CO2 20* 19* 20* 22  GLUCOSE 94 112* 86 140*  BUN 65* 42* 31* 29*  CALCIUM 8.4* 8.8*  8.5* 8.4*  CREATININE 2.13* 1.89* 1.91* 1.94*  GFRNONAA 28* 33* 32* 32*    LIVER FUNCTION TESTS: Recent Labs    03/18/22 1122 03/19/22 0150 03/21/22 0508 03/22/22 0406  BILITOT 1.1 0.5 0.7 0.5  AST 21 23 96* 104*  ALT 25 25 73* 112*  ALKPHOS 146* 150* 157* 166*  PROT 6.0* 6.6 6.1* 6.5  ALBUMIN 1.9* 2.1* 2.1* 2.3*     Assessment and Plan:  CT today demonstrates almost complete decompression of the dominant large component of biloma along the deep aspect of the left lobe of the liver previously measuring nearly 17 cm.  There is a small amount of fluid remaining around the pigtail portion of the drain measuring up to 3.5 cm.  Second region of fluid along the undersurface of the  right lobe and extending into the gallbladder fossa previously measured 11 cm.  There is some residual complex density fluid and a small amount of air remaining in this region measuring roughly 7 cm in greatest diameter.  A previously placed metallic common bile duct stent has migrated out of the common bile duct and is now within the ascending colon near the hepatic flexure.  Injection of the drainage catheter under fluoroscopy today demonstrates opacification of an irregular abscess cavity with no evidence of fistula to the biliary tree or bowel.  The additional complex portion of residual fluid seen by CT near the gallbladder fossa does not fill with contrast or communicate with the cavity containing the drain with injection of contrast.  Given the residual fluid present, small amount of persistent drainage from the drainage catheter and migration of the common bile duct stent, the percutaneous biloma drain will be left in place for at least another 2 weeks.  As the patient was not flushing the drain at home, she was given some pre-filled saline syringes and a prescription also called into her pharmacy for a 1 month supply of syringes.  She will begin flushing the drain with 10 mL of saline per day.  Repeat CT of the abdomen will be performed in approximately 2 weeks.  The CT will be again performed without IV contrast due to underlying chronic kidney disease.  Decision will be made at that time whether to inject the drain again under fluoroscopy depending on appearance of the CT.  Electronically Signed: Azzie Roup 04/07/2022, 3:16 PM    I spent a total of 15 Minutes in face to face in clinical consultation, greater than 50% of which was counseling/coordinating care for biloma drainage.

## 2022-04-10 ENCOUNTER — Other Ambulatory Visit: Payer: Self-pay | Admitting: General Surgery

## 2022-04-10 ENCOUNTER — Other Ambulatory Visit: Payer: Self-pay | Admitting: Interventional Radiology

## 2022-04-10 ENCOUNTER — Telehealth: Payer: Self-pay | Admitting: *Deleted

## 2022-04-10 DIAGNOSIS — T8189XA Other complications of procedures, not elsewhere classified, initial encounter: Secondary | ICD-10-CM

## 2022-04-10 NOTE — Telephone Encounter (Signed)
Patient has upcoming appointment scheduled 04/27/2022 with IR.   Note transcribed.

## 2022-04-10 NOTE — Telephone Encounter (Signed)
Call placed to IR to verify next appointment. [place].

## 2022-04-10 NOTE — Telephone Encounter (Signed)
Received call from patient.   Patient reports that she was seen by IR on 04/07/2022. The drainage catheter was left in place at the gallbladder fossa.   Patient is requesting note for work to extend leave.   Please advise.

## 2022-04-11 ENCOUNTER — Telehealth: Payer: PRIVATE HEALTH INSURANCE | Admitting: Physician Assistant

## 2022-04-11 DIAGNOSIS — R1110 Vomiting, unspecified: Secondary | ICD-10-CM

## 2022-04-11 NOTE — Telephone Encounter (Signed)
Margaret Yoder, Sounds like this will end up being more likely an infectious or inflammatory process. Patient can come in for a CBC/CMP/amylase/lipase. She can have a two-view KUB to ensure that the biliary stent is still in place. If she is having diarrhea, then a GI pathogen panel is reasonable. Antiemetics can be offered, Zofran 8 mg ODT every 8 hours as needed (30/2 refills). Antidiarrheal with Imodium can be considered. Could consider Pepto-Bismol as well. Give me an update tomorrow as to how things are going with her. Thanks. GM

## 2022-04-11 NOTE — Telephone Encounter (Signed)
The pt had ERCP on 8/1 and has been doing well until about a week ago.  She ate a taco salad and has been having nausea and vomiting since.  No other symptoms, fever, abd pain, SOB, etc.  She says she can keep liquids down but most foods come back up.  She does have some diarrhea.  Please advise

## 2022-04-11 NOTE — Progress Notes (Signed)
Based on what you shared with me, I feel your condition warrants further evaluation as soon as possible at an Emergency department. You can contact your GI or Surgeon's office first to see if they can get you in for an acute evaluation but if they are not able or you cannot get through to them, you need to be seen in the ER. This is not something we can fully evaluate via a virtual visit.    NOTE: There will be NO CHARGE for this eVisit   If you are having a true medical emergency please call 911.      Emergency [facility]  Get Driving Directions  [phone]  [address]  [place], [place] [address]  Open 24/7/365      [facility] Emergency Department at [place]  [address]  [place], [place] [address]  Open 24/7/365    Emergency [facility]  Get Driving Directions  [phone]  [address], [place] [address]  Open 24/7/365      [facility]  Get Driving Directions  [phone]  [address]  [place], [place] [address]  Open 24/7/365    [facility]  Emergency [place]  Get Driving Directions  [phone]  [place], [place] [address]  Open 24/7/365    [place]  Get Driving Directions  [address]  [place], [place] [address]  Open 24/7/365    [facility]  Emergency [facility]  Get Driving Directions  [phone]  [address]  [place], [place] [address]  Open 24/7/365

## 2022-04-11 NOTE — Telephone Encounter (Signed)
Patient called and states she's unable to keep food down and is feeling very weak. Please advise

## 2022-04-12 ENCOUNTER — Other Ambulatory Visit: Payer: Self-pay

## 2022-04-12 DIAGNOSIS — K839 Disease of biliary tract, unspecified: Secondary | ICD-10-CM

## 2022-04-12 DIAGNOSIS — R197 Diarrhea, unspecified: Secondary | ICD-10-CM

## 2022-04-12 DIAGNOSIS — R110 Nausea: Secondary | ICD-10-CM

## 2022-04-12 MED ORDER — ONDANSETRON 8 MG PO TBDP: 8.0000 mg | ORAL_TABLET | Freq: Three times a day (TID) | ORAL | 2 refills | Status: DC | PRN

## 2022-04-12 NOTE — Telephone Encounter (Signed)
I have spoken with the pt and she states her symptoms are about the same.  No worse. The pt has been advised that she should come in for labs and xray.  She will try to come in this week.  She will pick up the zofran and take pepto bismol for the diarrhea.  She does not like to take imodium she says is makes her sick.

## 2022-04-12 NOTE — Telephone Encounter (Signed)
Line busy  Lab and stool entered   KUB order entered   Zofran sent to pharmacy   Pt can use imodium or pepto as needed.

## 2022-04-14 ENCOUNTER — Ambulatory Visit (INDEPENDENT_AMBULATORY_CARE_PROVIDER_SITE_OTHER)
Admission: RE | Admit: 2022-04-14 | Discharge: 2022-04-14 | Disposition: A | Discharge status: Home / Self Care | Payer: PRIVATE HEALTH INSURANCE | Source: Ambulatory Visit | Attending: Gastroenterology | Admitting: Gastroenterology

## 2022-04-14 ENCOUNTER — Other Ambulatory Visit (INDEPENDENT_AMBULATORY_CARE_PROVIDER_SITE_OTHER): Payer: PRIVATE HEALTH INSURANCE

## 2022-04-14 DIAGNOSIS — R110 Nausea: Secondary | ICD-10-CM

## 2022-04-14 DIAGNOSIS — R197 Diarrhea, unspecified: Secondary | ICD-10-CM

## 2022-04-14 DIAGNOSIS — K839 Disease of biliary tract, unspecified: Secondary | ICD-10-CM

## 2022-04-14 LAB — COMPREHENSIVE METABOLIC PANEL
ALT: 7 U/L (ref 0–35)
AST: 13 U/L (ref 0–37)
Albumin: 3.2 g/dL — ABNORMAL LOW (ref 3.5–5.2)
Alkaline Phosphatase: 120 U/L — ABNORMAL HIGH (ref 39–117)
BUN: 26 mg/dL — ABNORMAL HIGH (ref 6–23)
CO2: 22 mEq/L (ref 19–32)
Calcium: 8.6 mg/dL (ref 8.4–10.5)
Chloride: 107 mEq/L (ref 96–112)
Creatinine, Ser: 3.05 mg/dL — ABNORMAL HIGH (ref 0.40–1.20)
GFR: 17.71 mL/min — ABNORMAL LOW (ref >60.00)
Glucose, Bld: 135 mg/dL — ABNORMAL HIGH (ref 70–99)
Potassium: 3.6 mEq/L (ref 3.5–5.1)
Sodium: 141 mEq/L (ref 135–145)
Total Bilirubin: 0.3 mg/dL (ref 0.2–1.2)
Total Protein: 7.4 g/dL (ref 6.0–8.3)

## 2022-04-14 LAB — CBC WITH DIFFERENTIAL/PLATELET
Basophils Absolute: 0.1 10*3/uL (ref 0.0–0.1)
Basophils Relative: 0.8 % (ref 0.0–3.0)
Eosinophils Absolute: 0.1 10*3/uL (ref 0.0–0.7)
Eosinophils Relative: 1.4 % (ref 0.0–5.0)
HCT: 31.5 % — ABNORMAL LOW (ref 36.0–46.0)
Hemoglobin: 10.2 g/dL — ABNORMAL LOW (ref 12.0–15.0)
Lymphocytes Relative: 41.6 % (ref 12.0–46.0)
Lymphs Abs: 3.1 10*3/uL (ref 0.7–4.0)
MCHC: 32.4 g/dL (ref 30.0–36.0)
MCV: 86.9 fl (ref 78.0–100.0)
Monocytes Absolute: 0.4 10*3/uL (ref 0.1–1.0)
Monocytes Relative: 6 % (ref 3.0–12.0)
Neutro Abs: 3.7 10*3/uL (ref 1.4–7.7)
Neutrophils Relative %: 50.2 % (ref 43.0–77.0)
Platelets: 274 10*3/uL (ref 150.0–400.0)
RBC: 3.62 Mil/uL — ABNORMAL LOW (ref 3.87–5.11)
RDW: 15 % (ref 11.5–15.5)
WBC: 7.5 10*3/uL (ref 4.0–10.5)

## 2022-04-14 LAB — LIPASE: Lipase: 78 U/L — ABNORMAL HIGH (ref 11.0–59.0)

## 2022-04-14 LAB — AMYLASE: Amylase: 60 U/L (ref 27–131)

## 2022-04-17 ENCOUNTER — Other Ambulatory Visit: Payer: Self-pay

## 2022-04-17 DIAGNOSIS — K839 Disease of biliary tract, unspecified: Secondary | ICD-10-CM

## 2022-04-17 DIAGNOSIS — R197 Diarrhea, unspecified: Secondary | ICD-10-CM

## 2022-04-17 DIAGNOSIS — R110 Nausea: Secondary | ICD-10-CM

## 2022-04-18 ENCOUNTER — Other Ambulatory Visit (HOSPITAL_COMMUNITY): Payer: Self-pay | Admitting: General Surgery

## 2022-04-20 ENCOUNTER — Telehealth: Payer: Self-pay | Admitting: Family Medicine

## 2022-04-20 NOTE — Telephone Encounter (Signed)
FMLA paperwork completed and faxed to Michelle (HR) at [phone]. Received confirmation.  Patient out of work 02/22/2022 and may return on 04/28/2022.

## 2022-04-27 ENCOUNTER — Ambulatory Visit
Admission: RE | Admit: 2022-04-27 | Discharge: 2022-04-27 | Disposition: A | Discharge status: Home / Self Care | Payer: PRIVATE HEALTH INSURANCE | Source: Ambulatory Visit | Attending: Interventional Radiology | Admitting: Interventional Radiology

## 2022-04-27 ENCOUNTER — Ambulatory Visit
Admission: RE | Admit: 2022-04-27 | Discharge: 2022-04-27 | Disposition: A | Discharge status: Home / Self Care | Payer: PRIVATE HEALTH INSURANCE | Source: Ambulatory Visit | Attending: General Surgery | Admitting: General Surgery

## 2022-04-27 ENCOUNTER — Encounter: Payer: Self-pay | Admitting: *Deleted

## 2022-04-27 ENCOUNTER — Other Ambulatory Visit: Payer: PRIVATE HEALTH INSURANCE

## 2022-04-27 DIAGNOSIS — K668 Other specified disorders of peritoneum: Secondary | ICD-10-CM

## 2022-04-27 HISTORY — PX: IR RADIOLOGIST EVAL & MGMT: IMG5224

## 2022-04-27 NOTE — Progress Notes (Signed)
Referring Physician(s): Dr Mickeal Needy Dr Mansouraty  Chief Complaint: The patient is seen in follow up today s/p . 10.2 [place] multipurpose pigtail drain inserted in abdominal fluid collection 03/18/22 in IR   History of present illness: Post cholecystectomy for cholelithiasis and acute cholecystitis on 02/22/2022. She presented on 03/17/2022 with abdominal pain and a large bilobed fluid collection along the undersurface of the liver suspicious for biloma.  She underwent percutaneous drain placement in the dominant portion of the biloma adjacent to the left lobe of the liver on 03/18/2022. A nuclear medicine study on 03/20/2022 was positive for bile leak. She underwent ERCP on 03/21/2022 by Dr. Rush Landmark who placed a metallic covered stent in the common bile duct to treat bile leak.  She had a follow-up appointment with Dr. Arnoldo Morale on 03/28/2022.     Was seen in IR clinic 8/18 with Dr Kathlene Cote:  A previously placed metallic common bile duct stent has migrated out of the common bile duct and is now within the ascending colon near the hepatic flexure.      Injection of the drainage catheter under fluoroscopy today demonstrates opacification of an irregular abscess cavity with no evidence of fistula to the biliary tree or bowel.  The additional complex portion of residual fluid seen by CT near the gallbladder fossa does not fill with contrast or communicate with the cavity containing the drain with injection of contrast. Given the residual fluid present, small amount of persistent drainage from the drainage catheter and migration of the common bile duct stent, the percutaneous biloma drain will be left in place for at least another 2 weeks.  As the patient was not flushing the drain at home, she was given some pre-filled saline syringes and a prescription also called into her pharmacy for a 1 month supply of syringes.  Here today for re CT and drain follow up OP is minimal Flushes every other day No longer  on antibiotics Denies fever; pain Follows with Dr Arnoldo Morale--- no appt as of yet   Past Medical History:  Diagnosis Date   Anxiety    Chronic back pain    Depression    Gout    Hypertension     Past Surgical History:  Procedure Laterality Date   BILIARY STENT PLACEMENT  03/21/2022   Procedure: BILIARY STENT PLACEMENT;  Surgeon: Irving Copas., MD;  Location: [place];  Service: Gastroenterology;;   BIOPSY  03/19/2022   Procedure: BIOPSY;  Surgeon: Irving Copas., MD;  Location: [place];  Service: Gastroenterology;;   CHOLECYSTECTOMY N/A 02/22/2022   Procedure: LAPAROSCOPIC CHOLECYSTECTOMY;  Surgeon: Aviva Signs, MD;  Location: [facility];  Service: General;  Laterality: N/A;   ERCP N/A 03/21/2022   Procedure: ENDOSCOPIC RETROGRADE CHOLANGIOPANCREATOGRAPHY (ERCP);  Surgeon: Irving Copas., MD;  Location: [place];  Service: Gastroenterology;  Laterality: N/A;   ESOPHAGOGASTRODUODENOSCOPY (EGD) WITH PROPOFOL N/A 03/19/2022   Procedure: ESOPHAGOGASTRODUODENOSCOPY (EGD) WITH PROPOFOL;  Surgeon: Rush Landmark Telford Nab., MD;  Location: [place];  Service: Gastroenterology;  Laterality: N/A;   IR RADIOLOGIST EVAL & MGMT  04/07/2022   IR US GUIDE BX ASP/DRAIN  03/18/2022   IR US GUIDE BX ASP/DRAIN  03/18/2022   REMOVAL OF STONES  03/21/2022   Procedure: REMOVAL OF STONES;  Surgeon: Irving Copas., MD;  Location: [place];  Service: Gastroenterology;;   Joan Mayans  03/21/2022   Procedure: Joan Mayans;  Surgeon: Irving Copas., MD;  Location: [place];  Service: Gastroenterology;;   VIDEO BRONCHOSCOPY N/A 11/11/2014  Procedure: VIDEO BRONCHOSCOPY;  Surgeon: Ivin Poot, MD;  Location: [facility] OR;  Service: Thoracic;  Laterality: N/A;    Allergies: Patient has no known allergies.  Medications: Prior to Admission medications   Medication Sig Start Date End Date Taking? Authorizing Provider  carvedilol (COREG) 6.25 MG tablet Take  1 tablet (6.25 mg total) by mouth 2 (two) times daily with a meal. 03/22/22   Shelly Coss, MD  ondansetron (ZOFRAN-ODT) 8 MG disintegrating tablet Take 1 tablet (8 mg total) by mouth every 8 (eight) hours as needed for nausea or vomiting. 04/12/22   Mansouraty, Telford Nab., MD  oxyCODONE (ROXICODONE) 5 MG immediate release tablet Take 1 tablet (5 mg total) by mouth every 4 (four) hours as needed for severe pain. 03/22/22   Shelly Coss, MD  pantoprazole (PROTONIX) 40 MG tablet Take 1 tablet (40 mg total) by mouth 2 (two) times daily before a meal. 03/22/22   Shelly Coss, MD  Sodium Chloride Flush (NORMAL SALINE FLUSH) 0.9 % SOLN Inject 10 mLs into the vein daily. 04/07/22   Jacqualine Mau, NP  sucralfate (CARAFATE) 1 g tablet Take 1 tablet (1 g total) by mouth 4 (four) times daily -  with meals and at bedtime. 03/22/22   Shelly Coss, MD     Family History  Family history unknown: Yes    Social History   Socioeconomic History   Marital status: Legally Separated    Spouse name: Not on file   Number of children: Not on file   Years of education: Not on file   Highest education level: Not on file  Occupational History   Not on file  Tobacco Use   Smoking status: Every Day    Packs/day: 1.00    Types: Cigarettes   Smokeless tobacco: Never  Substance and Sexual Activity   Alcohol use: No   Drug use: No   Sexual activity: Not on file  Other Topics Concern   Not on file  Social History Narrative   Not on file   Social Determinants of Health   Financial Resource Strain: Not on file  Food Insecurity: Not on file  Transportation Needs: Not on file  Physical Activity: Not on file  Stress: Not on file  Social Connections: Not on file     Vital Signs: There were no vitals taken for this visit.  Physical Exam Skin:    General: Skin is warm.     Comments: Site is clean and dry NT no bleeding No sign of infection OP in JP is ~15 cc over 24 hr collection      Imaging: No results found.  Labs:  CBC: Recent Labs    03/20/22 0117 03/21/22 0508 03/22/22 0406 04/14/22 1305  WBC 5.9 5.9 7.4 7.5  HGB 8.8* 8.6* 9.4* 10.2*  HCT 26.8* 26.8* 28.5* 31.5*  PLT 348 331 391 274.0    COAGS: Recent Labs    02/21/22 0450 03/18/22 0057  INR  --  1.3*  APTT 29  --     BMP: Recent Labs    03/19/22 0150 03/20/22 0117 03/21/22 0508 03/22/22 0406 04/14/22 1305  NA 141 142 140 141 141  K 4.5 4.8 4.6 4.2 3.6  CL 114* 115* 114* 113* 107  CO2 20* 19* 20* 22 22  GLUCOSE 94 112* 86 140* 135*  BUN 65* 42* 31* 29* 26*  CALCIUM 8.4* 8.8* 8.5* 8.4* 8.6  CREATININE 2.13* 1.89* 1.91* 1.94* 3.05*  GFRNONAA 28* 33* 32* 32*  --  LIVER FUNCTION TESTS: Recent Labs    03/19/22 0150 03/21/22 0508 03/22/22 0406 04/14/22 1305  BILITOT 0.5 0.7 0.5 0.3  AST 23 96* 104* 13  ALT 25 73* 112* 7  ALKPHOS 150* 157* 166* 120*  PROT 6.6 6.1* 6.5 7.4  ALBUMIN 2.1* 2.1* 2.3* 3.2*    Assessment:  CT today reviewed with Dr Mir Collection is resolved DC'd drain per Dr Dwaine Gale- without issue New dressing placed Follow up with Dr Arnoldo Morale as needed. She has good understanding of plan  Signed: Lavonia Drafts, PA-C 04/27/2022, 12:54 PM   Please refer to Dr. Dwaine Gale attestation of this note for management and plan.

## 2022-05-04 ENCOUNTER — Encounter (HOSPITAL_COMMUNITY): Payer: Self-pay | Admitting: Gastroenterology

## 2022-05-08 ENCOUNTER — Encounter (HOSPITAL_COMMUNITY): Payer: Self-pay | Admitting: Gastroenterology

## 2022-05-11 ENCOUNTER — Ambulatory Visit (HOSPITAL_COMMUNITY): Admission: RE | Admit: 2022-05-11 | Payer: Self-pay | Source: Home / Self Care | Admitting: Gastroenterology

## 2022-05-11 ENCOUNTER — Encounter (HOSPITAL_COMMUNITY): Payer: Self-pay | Admitting: Certified Registered"

## 2022-05-11 HISTORY — DX: Chronic kidney disease, unspecified: N18.9

## 2022-05-11 HISTORY — DX: Chronic obstructive pulmonary disease, unspecified: J44.9

## 2022-05-11 SURGERY — ENDOSCOPIC RETROGRADE CHOLANGIOPANCREATOGRAPHY (ERCP) WITH PROPOFOL
Anesthesia: General

## 2022-05-11 MED ORDER — PROPOFOL 500 MG/50ML IV EMUL
INTRAVENOUS | Status: AC
  Filled 2022-05-11: qty 50

## 2022-05-11 NOTE — Progress Notes (Signed)
Patient called and stated she was in emergency room last night for edema in her foot . Patient stated she can barely stand on it and it is taking a lot out of her at home. She wishes to cancel. I made patient aware that it might be a long time to reschedule. I gave patient number to dr. Donneta Romberg office. I made Dr. Rush Landmark aware. Patient says she understands but does not feel like she can come today.

## 2022-05-11 NOTE — Anesthesia Preprocedure Evaluation (Deleted)
Anesthesia Evaluation    Reviewed: Allergy & Precautions, Patient's Chart, lab work & pertinent test results, reviewed documented beta blocker date and time   Airway        Dental   Pulmonary COPD, Current Smoker and Patient abstained from smoking.,           Cardiovascular hypertension, Pt. on medications and Pt. on home beta blockers      Neuro/Psych PSYCHIATRIC DISORDERS Anxiety Depression negative neurological ROS     GI/Hepatic Neg liver ROS, Bile leak    Endo/Other  negative endocrine ROS  Renal/GU CRFRenal diseaseCr 3.05  negative genitourinary   Musculoskeletal negative musculoskeletal ROS (+)   Abdominal   Peds  Hematology negative hematology ROS (+)   Anesthesia Other Findings   Reproductive/Obstetrics Urine preg neg                             Anesthesia Physical Anesthesia Plan  ASA: 3  Anesthesia Plan: General   Post-op Pain Management:    Induction: Intravenous  PONV Risk Score and Plan: 2 and Ondansetron, Dexamethasone, Treatment may vary due to age or medical condition and Midazolam  Airway Management Planned: Oral ETT  Additional Equipment: None  Intra-op Plan:   Post-operative Plan: Extubation in OR  Informed Consent:   Plan Discussed with:   Anesthesia Plan Comments:         Anesthesia Quick Evaluation

## 2022-05-15 ENCOUNTER — Telehealth: Payer: Self-pay

## 2022-05-15 NOTE — Telephone Encounter (Signed)
Mansouraty, Telford Nab., MD  Paulo Fruit, RN; Timothy Lasso, RN Santiago Glad,  Thanks for forwarding this.  GM   Keric Zehren or covering RN,  This patient needs to have a follow-up in clinic with me before we reschedule an ERCP, especially since her drain is already been removed with improved imaging.  Okay to have follow-up in clinic in the next 6 to 10 weeks.  Thanks.  GM

## 2022-05-16 NOTE — Telephone Encounter (Signed)
12/6 at 830 am appt made to see GM.  Will mail letter to the pt

## 2022-07-26 ENCOUNTER — Other Ambulatory Visit (INDEPENDENT_AMBULATORY_CARE_PROVIDER_SITE_OTHER): Payer: PRIVATE HEALTH INSURANCE

## 2022-07-26 ENCOUNTER — Encounter: Payer: Self-pay | Admitting: Gastroenterology

## 2022-07-26 ENCOUNTER — Ambulatory Visit (INDEPENDENT_AMBULATORY_CARE_PROVIDER_SITE_OTHER): Payer: PRIVATE HEALTH INSURANCE | Admitting: Gastroenterology

## 2022-07-26 VITALS — BP 124/78 | HR 85 | Ht 63.0 in | Wt 145.8 lb

## 2022-07-26 DIAGNOSIS — R194 Change in bowel habit: Secondary | ICD-10-CM

## 2022-07-26 DIAGNOSIS — K5900 Constipation, unspecified: Secondary | ICD-10-CM

## 2022-07-26 DIAGNOSIS — R634 Abnormal weight loss: Secondary | ICD-10-CM

## 2022-07-26 DIAGNOSIS — Z9049 Acquired absence of other specified parts of digestive tract: Secondary | ICD-10-CM | POA: Diagnosis not present

## 2022-07-26 DIAGNOSIS — R1114 Bilious vomiting: Secondary | ICD-10-CM

## 2022-07-26 DIAGNOSIS — K668 Other specified disorders of peritoneum: Secondary | ICD-10-CM | POA: Diagnosis not present

## 2022-07-26 DIAGNOSIS — K839 Disease of biliary tract, unspecified: Secondary | ICD-10-CM

## 2022-07-26 DIAGNOSIS — Z1211 Encounter for screening for malignant neoplasm of colon: Secondary | ICD-10-CM

## 2022-07-26 DIAGNOSIS — R112 Nausea with vomiting, unspecified: Secondary | ICD-10-CM

## 2022-07-26 DIAGNOSIS — R748 Abnormal levels of other serum enzymes: Secondary | ICD-10-CM

## 2022-07-26 DIAGNOSIS — R6881 Early satiety: Secondary | ICD-10-CM

## 2022-07-26 LAB — CBC
HCT: 42.4 % (ref 36.0–46.0)
Hemoglobin: 14.1 g/dL (ref 12.0–15.0)
MCHC: 33.3 g/dL (ref 30.0–36.0)
MCV: 83.8 fl (ref 78.0–100.0)
Platelets: 168 10*3/uL (ref 150.0–400.0)
RBC: 5.06 Mil/uL (ref 3.87–5.11)
RDW: 15.2 % (ref 11.5–15.5)
WBC: 7.5 10*3/uL (ref 4.0–10.5)

## 2022-07-26 LAB — AMYLASE: Amylase: 33 U/L (ref 27–131)

## 2022-07-26 LAB — COMPREHENSIVE METABOLIC PANEL
ALT: 9 U/L (ref 0–35)
AST: 15 U/L (ref 0–37)
Albumin: 3.9 g/dL (ref 3.5–5.2)
Alkaline Phosphatase: 79 U/L (ref 39–117)
BUN: 17 mg/dL (ref 6–23)
CO2: 28 mEq/L (ref 19–32)
Calcium: 9.1 mg/dL (ref 8.4–10.5)
Chloride: 104 mEq/L (ref 96–112)
Creatinine, Ser: 2.15 mg/dL — ABNORMAL HIGH (ref 0.40–1.20)
GFR: 26.9 mL/min — ABNORMAL LOW (ref >60.00)
Glucose, Bld: 98 mg/dL (ref 70–99)
Potassium: 4.4 mEq/L (ref 3.5–5.1)
Sodium: 139 mEq/L (ref 135–145)
Total Bilirubin: 0.4 mg/dL (ref 0.2–1.2)
Total Protein: 7.2 g/dL (ref 6.0–8.3)

## 2022-07-26 LAB — LIPASE: Lipase: 12 U/L (ref 11.0–59.0)

## 2022-07-26 LAB — BILIRUBIN, DIRECT: Bilirubin, Direct: 0.1 mg/dL (ref 0.0–0.3)

## 2022-07-26 LAB — CORTISOL: Cortisol, Plasma: 8.4 ug/dL

## 2022-07-26 LAB — TSH: TSH: 0.89 u[IU]/mL (ref 0.35–5.50)

## 2022-07-26 MED ORDER — ONDANSETRON 8 MG PO TBDP: 8.0000 mg | ORAL_TABLET | Freq: Three times a day (TID) | ORAL | 1 refills | Status: AC | PRN

## 2022-07-26 NOTE — Progress Notes (Addendum)
[place] VISIT   Primary Care Provider Candi Leash, PA-C [place] [address] [phone]  Patient Profile: Margaret Yoder is a 46 y.o. female with a pmh significant for HTN, COPD, CRI, s/p CCK with subsequently biliary leak (s/p ERCP with biliary sphincterotomy/stenting).  The patient presents to the [facility] for an evaluation and management of problem(s) noted below:  Problem List 1. Bile leak   2. Biloma   3. History of cholecystectomy   4. Early satiety   5. Bilious vomiting with nausea   6. Unintentional weight loss   7. Constipation, unspecified constipation type   8. Change in bowel habits   9. Colon cancer screening   10. Elevated alkaline phosphatase level     History of Present Illness This is the patient's first visit to the outpatient [place] clinic, and she is accompanied by her family member.  I met her this summer in the setting of concern for postprocedural biloma.  She underwent interventional radiology drainage and subsequently an ERCP that showed cystic duct extravasation.  A fully covered metal stent was placed.  She was able to be discharged with her drain in place.  She followed up with interventional radiology and repeat imaging had shown significant improvement of the previous biloma with decreasing flow from her drain.  At the time of her follow-up CT scan as well it had shown that the fully covered metal stent had actually displaced and left the patient's body.  Patient's repeat ERCP was canceled.  Patient was to follow-up in the course of the coming weeks and is now here for her follow-up.  Overall, the patient does feel better than how she did earlier this year.  However, she has developed significant early satiety.  Every 2 weeks or so she will have some significant nausea and subsequent vomiting and then that goes away.  She also has decreased appetite.  She has lost a  significant amount of weight (approximately 50 pounds since her gallbladder resection in her hospitalization).  She has noted previously having normal bowel movements on a daily basis but since her gallbladder was removed she has had a more significant constipation where in which she is only using the restroom every 3 to 4 days.  She notices no blood in her stools.  She has never had colon cancer screening.  GI Review of Systems Positive as above Negative for dysphagia, odynophagia, melena, hematochezia  Review of Systems General: Denies fevers/chills Cardiovascular: Denies chest pain Pulmonary: Denies shortness of breath Gastroenterological: See HPI Genitourinary: Denies darkened urine  Hematological: Denies easy bruising/bleeding Endocrine: Denies temperature intolerance Dermatological: Denies jaundice Psychological: Mood is stable   Medications Current Outpatient Medications  Medication Sig Dispense Refill   amLODipine (NORVASC) 10 MG tablet Take 10 mg by mouth daily.     carvedilol (COREG) 6.25 MG tablet Take 1 tablet (6.25 mg total) by mouth 2 (two) times daily with a meal. 60 tablet 0   colchicine 0.6 MG tablet 1 tablet Orally BID prn for 30 day(s)     escitalopram (LEXAPRO) 5 MG tablet 1 tablet Orally Once a day for 30 day(s)     ondansetron (ZOFRAN-ODT) 8 MG disintegrating tablet Take 1 tablet (8 mg total) by mouth every 8 (eight) hours as needed for nausea or vomiting. 30 tablet 1   pantoprazole (PROTONIX) 40 MG tablet Take 1 tablet (40 mg total) by mouth 2 (two) times daily before a meal. (  Patient not taking: Reported on 05/09/2022) 60 tablet 0   Sodium Chloride Flush (NORMAL SALINE FLUSH) 0.9 % SOLN Inject 10 mLs into the vein daily. (Patient not taking: Reported on 07/26/2022) 300 mL 0   sucralfate (CARAFATE) 1 g tablet Take 1 tablet (1 g total) by mouth 4 (four) times daily -  with meals and at bedtime. (Patient not taking: Reported on 05/09/2022) 120 tablet 1   No current  facility-administered medications for this visit.    Allergies No Known Allergies  Histories Past Medical History:  Diagnosis Date   Anxiety    Chronic back pain    Chronic kidney disease    stg 4   COPD (chronic obstructive pulmonary disease) (HCC)    Depression    Gout    Hypertension    Past Surgical History:  Procedure Laterality Date   BILIARY STENT PLACEMENT  03/21/2022   Procedure: BILIARY STENT PLACEMENT;  Surgeon: Irving Copas., MD;  Location: [place];  Service: Gastroenterology;;   BIOPSY  03/19/2022   Procedure: BIOPSY;  Surgeon: Irving Copas., MD;  Location: [place];  Service: Gastroenterology;;   CHOLECYSTECTOMY N/A 02/22/2022   Procedure: LAPAROSCOPIC CHOLECYSTECTOMY;  Surgeon: Aviva Signs, MD;  Location: [facility];  Service: General;  Laterality: N/A;   ERCP N/A 03/21/2022   Procedure: ENDOSCOPIC RETROGRADE CHOLANGIOPANCREATOGRAPHY (ERCP);  Surgeon: Irving Copas., MD;  Location: [place];  Service: Gastroenterology;  Laterality: N/A;   ESOPHAGOGASTRODUODENOSCOPY (EGD) WITH PROPOFOL N/A 03/19/2022   Procedure: ESOPHAGOGASTRODUODENOSCOPY (EGD) WITH PROPOFOL;  Surgeon: Rush Landmark Telford Nab., MD;  Location: [place];  Service: Gastroenterology;  Laterality: N/A;   IR RADIOLOGIST EVAL & MGMT  04/07/2022   IR RADIOLOGIST EVAL & MGMT  04/27/2022   IR US GUIDE BX ASP/DRAIN  03/18/2022   IR US GUIDE BX ASP/DRAIN  03/18/2022   REMOVAL OF STONES  03/21/2022   Procedure: REMOVAL OF STONES;  Surgeon: Irving Copas., MD;  Location: [place];  Service: Gastroenterology;;   Joan Mayans  03/21/2022   Procedure: Joan Mayans;  Surgeon: Mansouraty, Telford Nab., MD;  Location: [place];  Service: Gastroenterology;;   VIDEO BRONCHOSCOPY N/A 11/11/2014   Procedure: VIDEO BRONCHOSCOPY;  Surgeon: Ivin Poot, MD;  Location: [facility];  Service: Thoracic;  Laterality: N/A;   Social History   Socioeconomic History   Marital status:  Legally Separated    Spouse name: Not on file   Number of children: Not on file   Years of education: Not on file   Highest education level: Not on file  Occupational History   Not on file  Tobacco Use   Smoking status: Every Day    Packs/day: 1.00    Types: Cigarettes   Smokeless tobacco: Never  Substance and Sexual Activity   Alcohol use: No   Drug use: No   Sexual activity: Not on file  Other Topics Concern   Not on file  Social History Narrative   Not on file   Social Determinants of Health   Financial Resource Strain: Not on file  Food Insecurity: Not on file  Transportation Needs: Not on file  Physical Activity: Not on file  Stress: Not on file  Social Connections: Not on file  Intimate Partner Violence: Not on file   Family History  Problem Relation Age of Onset   Colon cancer Neg Hx    Esophageal cancer Neg Hx    Inflammatory bowel disease Neg Hx    Liver disease Neg Hx    Pancreatic  cancer Neg Hx    Rectal cancer Neg Hx    Stomach cancer Neg Hx    I have reviewed her medical, social, and family history in detail and updated the electronic medical record as necessary.    PHYSICAL EXAMINATION  BP 124/78   Pulse 85   Ht _0  (1.6 m)   Wt 145 lb 12.8 oz (66.1 kg)   SpO2 96%   BMI 25.83 kg/m  Wt Readings from Last 3 Encounters:  07/26/22 145 lb 12.8 oz (66.1 kg)  03/28/22 149 lb (67.6 kg)  03/21/22 169 lb 1.5 oz (76.7 kg)  GEN: NAD, appears stated age, doesn't appear chronically ill, accompanied by family member PSYCH: Cooperative, without pressured speech EYE: Conjunctivae pink, sclerae anicteric ENT: MMM CV: Nontachycardic RESP: No audible wheezing GI: NABS, soft, scars present, protuberant, rounded, nontender, without rebound or guarding MSK/EXT: No significant lower extremity edema SKIN: No jaundice NEURO:  Alert & Oriented x 3, no focal deficits   REVIEW OF DATA  I reviewed the following data at the time of this encounter:  GI  Procedures and Studies  August 2023 ERCP - The major papilla appeared congested and bulging. - The common bile duct was moderately dilated. - Required a two-wire technique to enter into the biliary tree but the first wire was in the cystic duct (the pancreas was never entered). - A bile leak was found. - The biliary tree was swept and sludge was found. - A biliary sphincterotomy was performed. - One covered metal biliary stent was placed into the distal common bile duct.  Laboratory Studies  Reviewed those in epic  Imaging Studies  September 2023 CTAP IMPRESSION: 1. Interval resolution of previously seen bilomas, both at the drain site, adjacent to the left hepatic lobe and within the gallbladder fossa. Left perihepatic drain is in unchanged position. 2. 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 3. Previously seen metallic biliary stent is no longer identified.   ASSESSMENT  Ms. Brose is a 46 y.o. female with a pmh significant for HTN, COPD, CRI, s/p CCK with subsequently biliary leak (s/p ERCP with biliary sphincterotomy/stenting).  The patient is seen today for evaluation and management of:  1. Bile leak   2. Biloma   3. History of cholecystectomy   4. Early satiety   5. Bilious vomiting with nausea   6. Unintentional weight loss   7. Constipation, unspecified constipation type   8. Change in bowel habits   9. Colon cancer screening   10. Elevated alkaline phosphatase level    The patient is hemodynamically stable.  Clinically, she is feeling better since her gallbladder resection and the previous biloma being drained.  My biggest concern with her significant early satiety and unintentional weight loss however is whether there is a chance this fluid collection could have recurred.  I do not feel that on her physical exam today in regards to succussion splash or movement but we need to be vigilant.  Will plan some updated laboratories today and I am updated  imaging.  Should the patient have redeveloped a fluid collection, it may require repeat drainage as well as stent placement via ERCP.  Is not that simple to consider a cyst biloma drainage endoscopically other than cyst aspiration that could be performed.  Time will tell.  Changes in the patient's bowel habits are significant and although I would have expected she would have had more of a diarrheal type of bowel movement issue seems constipation is playing  her.  Of asked the patient to initiate fiber supplementation as well as laxative therapy in an effort of trying to have a bowel movement every 2 to 3 days instead of every 4 to 5 days.  This may help with some of her symptoms that occur every couple of weeks in regards to nausea and vomiting.  She needs colon cancer screening.  We discussed colonoscopy and Cologuard testing.  After discussions it is felt that she will likely choose colonoscopy but wants to wait for the early satiety symptoms as well as a CT scan to be completed before undergoing any sort of other invasive procedures.  All patient questions were answered to the best of my ability, and the patient agrees to the aforementioned plan of action with follow-up as indicated.   PLAN  Laboratories as outlined below Proceed with scheduling CT abdomen pelvis with oral contrast and without IV contrast due to CRI Zofran 8 mg every 8 hours as needed nausea Smaller meals to be ingested every few hours rather than larger meals in effort of trying to maintain weight Focus on protein shakes as necessary Colon cancer screening via colonoscopy or Cologuard testing to be discussed further at follow-up Begin MiraLAX daily or every other day May use Dulcolax if no bowel movement after 3 days Initiate FiberCon or fiber supplement daily Toileting techniques discussed with patient   Orders Placed This Encounter  Procedures   CT ABDOMEN PELVIS WO CONTRAST   CBC   Comp Met (CMET)   Bilirubin, Direct    Amylase   Lipase   TSH   Cortisol   IgA   Tissue transglutaminase, IgA    New Prescriptions   No medications on file   Modified Medications   Modified Medication Previous Medication   ONDANSETRON (ZOFRAN-ODT) 8 MG DISINTEGRATING TABLET ondansetron (ZOFRAN-ODT) 8 MG disintegrating tablet      Take 1 tablet (8 mg total) by mouth every 8 (eight) hours as needed for nausea or vomiting.    Take 1 tablet (8 mg total) by mouth every 8 (eight) hours as needed for nausea or vomiting.    Planned Follow Up No follow-ups on file.   Total Time in Face-to-Face and in Coordination of Care for patient including independent/personal interpretation/review of prior testing, medical history, examination, medication adjustment, communicating results with the patient directly, and documentation within the EHR is 30 minutes.   Justice Britain, MD [facility] Office # [phone]

## 2022-07-26 NOTE — Patient Instructions (Signed)
Your provider has requested that you go to the basement level for lab work before leaving today. Press "B" on the elevator. The lab is located at the first door on the left as you exit the elevator.  We have sent the following medications to your pharmacy for you to pick up at your convenience: Zofran  Start Colace -Take 1 pill by mouth 1-2 times daily , if no bowel movement in 2-3 days, add Miralax and Doculax to help with bowel movements.   You will be due for a recall colonoscopy in Feb-March 2024. We will send you a reminder in the mail when it gets closer to that time.   You will be contacted by [facility] in the next 2 days to arrange a CT Scan   The number on your caller ID will be [phone], please answer when they call.  If you have not heard from them in 2 days please call [phone] to schedule.    Due to recent changes in healthcare laws, you may see the results of your imaging and laboratory studies on MyChart before your provider has had a chance to review them.  We understand that in some cases there may be results that are confusing or concerning to you. Not all laboratory results come back in the same time frame and the provider may be waiting for multiple results in order to interpret others.  Please give [place] 48 hours in order for your provider to thoroughly review all the results before contacting the office for clarification of your results.   Thank you for choosing me and [facility].  Dr. Rush Landmark

## 2022-07-27 LAB — TISSUE TRANSGLUTAMINASE, IGA: (tTG) Ab, IgA: <1 U/mL

## 2022-07-27 LAB — IGA: Immunoglobulin A: 234 mg/dL (ref 47–310)

## 2022-07-30 DIAGNOSIS — K668 Other specified disorders of peritoneum: Secondary | ICD-10-CM | POA: Insufficient documentation

## 2022-07-30 DIAGNOSIS — K5900 Constipation, unspecified: Secondary | ICD-10-CM | POA: Insufficient documentation

## 2022-07-30 DIAGNOSIS — R194 Change in bowel habit: Secondary | ICD-10-CM | POA: Insufficient documentation

## 2022-07-30 DIAGNOSIS — Z1211 Encounter for screening for malignant neoplasm of colon: Secondary | ICD-10-CM | POA: Insufficient documentation

## 2022-07-30 DIAGNOSIS — R634 Abnormal weight loss: Secondary | ICD-10-CM | POA: Insufficient documentation

## 2022-07-30 DIAGNOSIS — Z9049 Acquired absence of other specified parts of digestive tract: Secondary | ICD-10-CM | POA: Insufficient documentation

## 2022-07-30 DIAGNOSIS — R6881 Early satiety: Secondary | ICD-10-CM | POA: Insufficient documentation

## 2022-07-30 DIAGNOSIS — R1114 Bilious vomiting: Secondary | ICD-10-CM | POA: Insufficient documentation

## 2022-07-30 DIAGNOSIS — K839 Disease of biliary tract, unspecified: Secondary | ICD-10-CM | POA: Insufficient documentation

## 2022-08-09 ENCOUNTER — Ambulatory Visit: Payer: Self-pay | Admitting: Surgery

## 2022-08-25 ENCOUNTER — Ambulatory Visit (HOSPITAL_COMMUNITY)
Admission: RE | Admit: 2022-08-25 | Discharge: 2022-08-25 | Disposition: A | Discharge status: Home / Self Care | Payer: 59 | Source: Ambulatory Visit | Attending: Gastroenterology | Admitting: Gastroenterology

## 2022-08-25 DIAGNOSIS — R1114 Bilious vomiting: Secondary | ICD-10-CM

## 2022-08-25 DIAGNOSIS — K839 Disease of biliary tract, unspecified: Secondary | ICD-10-CM

## 2022-08-25 DIAGNOSIS — R748 Abnormal levels of other serum enzymes: Secondary | ICD-10-CM

## 2022-08-25 DIAGNOSIS — R634 Abnormal weight loss: Secondary | ICD-10-CM | POA: Diagnosis present

## 2022-08-25 DIAGNOSIS — R194 Change in bowel habit: Secondary | ICD-10-CM

## 2022-08-25 DIAGNOSIS — K5900 Constipation, unspecified: Secondary | ICD-10-CM | POA: Diagnosis present

## 2022-08-25 DIAGNOSIS — R6881 Early satiety: Secondary | ICD-10-CM | POA: Diagnosis present

## 2022-08-25 DIAGNOSIS — Z1211 Encounter for screening for malignant neoplasm of colon: Secondary | ICD-10-CM | POA: Diagnosis present

## 2022-08-25 DIAGNOSIS — Z9049 Acquired absence of other specified parts of digestive tract: Secondary | ICD-10-CM

## 2022-08-25 DIAGNOSIS — K668 Other specified disorders of peritoneum: Secondary | ICD-10-CM

## 2022-08-30 ENCOUNTER — Encounter: Payer: Self-pay | Admitting: Gastroenterology

## 2022-09-25 DIAGNOSIS — H1030 Unspecified acute conjunctivitis, unspecified eye: Secondary | ICD-10-CM | POA: Diagnosis not present

## 2022-09-25 DIAGNOSIS — R0989 Other specified symptoms and signs involving the circulatory and respiratory systems: Secondary | ICD-10-CM | POA: Diagnosis not present

## 2022-10-13 ENCOUNTER — Ambulatory Visit: Payer: PRIVATE HEALTH INSURANCE | Admitting: Gastroenterology

## 2022-10-27 DIAGNOSIS — F32A Depression, unspecified: Secondary | ICD-10-CM | POA: Diagnosis not present

## 2022-10-27 DIAGNOSIS — F1120 Opioid dependence, uncomplicated: Secondary | ICD-10-CM | POA: Diagnosis not present

## 2022-10-27 DIAGNOSIS — F1490 Cocaine use, unspecified, uncomplicated: Secondary | ICD-10-CM | POA: Diagnosis not present

## 2022-10-27 DIAGNOSIS — F419 Anxiety disorder, unspecified: Secondary | ICD-10-CM | POA: Diagnosis not present

## 2022-10-30 DIAGNOSIS — F1120 Opioid dependence, uncomplicated: Secondary | ICD-10-CM | POA: Diagnosis not present

## 2022-11-03 DIAGNOSIS — F1490 Cocaine use, unspecified, uncomplicated: Secondary | ICD-10-CM | POA: Diagnosis not present

## 2022-11-03 DIAGNOSIS — F419 Anxiety disorder, unspecified: Secondary | ICD-10-CM | POA: Diagnosis not present

## 2022-11-03 DIAGNOSIS — F32A Depression, unspecified: Secondary | ICD-10-CM | POA: Diagnosis not present

## 2022-11-03 DIAGNOSIS — F1120 Opioid dependence, uncomplicated: Secondary | ICD-10-CM | POA: Diagnosis not present

## 2022-11-06 DIAGNOSIS — F1120 Opioid dependence, uncomplicated: Secondary | ICD-10-CM | POA: Diagnosis not present

## 2022-11-06 DIAGNOSIS — F1490 Cocaine use, unspecified, uncomplicated: Secondary | ICD-10-CM | POA: Diagnosis not present

## 2022-11-06 DIAGNOSIS — F32A Depression, unspecified: Secondary | ICD-10-CM | POA: Diagnosis not present

## 2022-11-06 DIAGNOSIS — J449 Chronic obstructive pulmonary disease, unspecified: Secondary | ICD-10-CM | POA: Diagnosis not present

## 2022-11-06 DIAGNOSIS — F419 Anxiety disorder, unspecified: Secondary | ICD-10-CM | POA: Diagnosis not present

## 2022-11-06 DIAGNOSIS — E049 Nontoxic goiter, unspecified: Secondary | ICD-10-CM | POA: Diagnosis not present

## 2022-11-13 DIAGNOSIS — F32A Depression, unspecified: Secondary | ICD-10-CM | POA: Diagnosis not present

## 2022-11-13 DIAGNOSIS — F1490 Cocaine use, unspecified, uncomplicated: Secondary | ICD-10-CM | POA: Diagnosis not present

## 2022-11-13 DIAGNOSIS — F419 Anxiety disorder, unspecified: Secondary | ICD-10-CM | POA: Diagnosis not present

## 2022-11-13 DIAGNOSIS — I10 Essential (primary) hypertension: Secondary | ICD-10-CM | POA: Diagnosis not present

## 2022-11-13 DIAGNOSIS — E049 Nontoxic goiter, unspecified: Secondary | ICD-10-CM | POA: Diagnosis not present

## 2022-11-13 DIAGNOSIS — F1120 Opioid dependence, uncomplicated: Secondary | ICD-10-CM | POA: Diagnosis not present

## 2022-11-14 DIAGNOSIS — F1120 Opioid dependence, uncomplicated: Secondary | ICD-10-CM | POA: Diagnosis not present

## 2022-11-14 DIAGNOSIS — F419 Anxiety disorder, unspecified: Secondary | ICD-10-CM | POA: Diagnosis not present

## 2022-11-14 DIAGNOSIS — F1490 Cocaine use, unspecified, uncomplicated: Secondary | ICD-10-CM | POA: Diagnosis not present

## 2022-11-14 DIAGNOSIS — E049 Nontoxic goiter, unspecified: Secondary | ICD-10-CM | POA: Diagnosis not present

## 2022-11-14 DIAGNOSIS — J449 Chronic obstructive pulmonary disease, unspecified: Secondary | ICD-10-CM | POA: Diagnosis not present

## 2022-11-20 DIAGNOSIS — F1120 Opioid dependence, uncomplicated: Secondary | ICD-10-CM | POA: Diagnosis not present

## 2022-11-20 DIAGNOSIS — J449 Chronic obstructive pulmonary disease, unspecified: Secondary | ICD-10-CM | POA: Diagnosis not present

## 2022-11-20 DIAGNOSIS — E049 Nontoxic goiter, unspecified: Secondary | ICD-10-CM | POA: Diagnosis not present

## 2022-11-20 DIAGNOSIS — F32A Depression, unspecified: Secondary | ICD-10-CM | POA: Diagnosis not present

## 2022-11-20 DIAGNOSIS — F419 Anxiety disorder, unspecified: Secondary | ICD-10-CM | POA: Diagnosis not present

## 2022-11-20 DIAGNOSIS — F1490 Cocaine use, unspecified, uncomplicated: Secondary | ICD-10-CM | POA: Diagnosis not present

## 2022-11-24 DIAGNOSIS — F32A Depression, unspecified: Secondary | ICD-10-CM | POA: Diagnosis not present

## 2022-11-24 DIAGNOSIS — F419 Anxiety disorder, unspecified: Secondary | ICD-10-CM | POA: Diagnosis not present

## 2022-11-24 DIAGNOSIS — F1490 Cocaine use, unspecified, uncomplicated: Secondary | ICD-10-CM | POA: Diagnosis not present

## 2022-11-24 DIAGNOSIS — F1120 Opioid dependence, uncomplicated: Secondary | ICD-10-CM | POA: Diagnosis not present

## 2022-11-27 DIAGNOSIS — F419 Anxiety disorder, unspecified: Secondary | ICD-10-CM | POA: Diagnosis not present

## 2022-11-27 DIAGNOSIS — M109 Gout, unspecified: Secondary | ICD-10-CM | POA: Diagnosis not present

## 2022-11-27 DIAGNOSIS — I10 Essential (primary) hypertension: Secondary | ICD-10-CM | POA: Diagnosis not present

## 2022-11-27 DIAGNOSIS — F1120 Opioid dependence, uncomplicated: Secondary | ICD-10-CM | POA: Diagnosis not present

## 2022-11-27 DIAGNOSIS — E010 Iodine-deficiency related diffuse (endemic) goiter: Secondary | ICD-10-CM | POA: Diagnosis not present

## 2022-11-27 DIAGNOSIS — F329 Major depressive disorder, single episode, unspecified: Secondary | ICD-10-CM | POA: Diagnosis not present

## 2022-11-27 DIAGNOSIS — N189 Chronic kidney disease, unspecified: Secondary | ICD-10-CM | POA: Diagnosis not present

## 2022-11-27 DIAGNOSIS — F1490 Cocaine use, unspecified, uncomplicated: Secondary | ICD-10-CM | POA: Diagnosis not present

## 2022-12-01 DIAGNOSIS — F1120 Opioid dependence, uncomplicated: Secondary | ICD-10-CM | POA: Diagnosis not present

## 2022-12-01 DIAGNOSIS — F1490 Cocaine use, unspecified, uncomplicated: Secondary | ICD-10-CM | POA: Diagnosis not present

## 2022-12-01 DIAGNOSIS — F419 Anxiety disorder, unspecified: Secondary | ICD-10-CM | POA: Diagnosis not present

## 2022-12-01 DIAGNOSIS — F32A Depression, unspecified: Secondary | ICD-10-CM | POA: Diagnosis not present

## 2022-12-04 DIAGNOSIS — F1120 Opioid dependence, uncomplicated: Secondary | ICD-10-CM | POA: Diagnosis not present

## 2022-12-04 DIAGNOSIS — F32A Depression, unspecified: Secondary | ICD-10-CM | POA: Diagnosis not present

## 2022-12-04 DIAGNOSIS — F1490 Cocaine use, unspecified, uncomplicated: Secondary | ICD-10-CM | POA: Diagnosis not present

## 2022-12-04 DIAGNOSIS — F419 Anxiety disorder, unspecified: Secondary | ICD-10-CM | POA: Diagnosis not present

## 2022-12-04 DIAGNOSIS — I10 Essential (primary) hypertension: Secondary | ICD-10-CM | POA: Diagnosis not present

## 2022-12-08 DIAGNOSIS — F419 Anxiety disorder, unspecified: Secondary | ICD-10-CM | POA: Diagnosis not present

## 2022-12-08 DIAGNOSIS — F1490 Cocaine use, unspecified, uncomplicated: Secondary | ICD-10-CM | POA: Diagnosis not present

## 2022-12-08 DIAGNOSIS — F32A Depression, unspecified: Secondary | ICD-10-CM | POA: Diagnosis not present

## 2022-12-08 DIAGNOSIS — F1120 Opioid dependence, uncomplicated: Secondary | ICD-10-CM | POA: Diagnosis not present

## 2022-12-11 DIAGNOSIS — F1490 Cocaine use, unspecified, uncomplicated: Secondary | ICD-10-CM | POA: Diagnosis not present

## 2022-12-11 DIAGNOSIS — I10 Essential (primary) hypertension: Secondary | ICD-10-CM | POA: Diagnosis not present

## 2022-12-11 DIAGNOSIS — J449 Chronic obstructive pulmonary disease, unspecified: Secondary | ICD-10-CM | POA: Diagnosis not present

## 2022-12-11 DIAGNOSIS — F419 Anxiety disorder, unspecified: Secondary | ICD-10-CM | POA: Diagnosis not present

## 2022-12-11 DIAGNOSIS — F1120 Opioid dependence, uncomplicated: Secondary | ICD-10-CM | POA: Diagnosis not present

## 2022-12-11 DIAGNOSIS — F32A Depression, unspecified: Secondary | ICD-10-CM | POA: Diagnosis not present

## 2022-12-12 DIAGNOSIS — F1490 Cocaine use, unspecified, uncomplicated: Secondary | ICD-10-CM | POA: Diagnosis not present

## 2022-12-12 DIAGNOSIS — I10 Essential (primary) hypertension: Secondary | ICD-10-CM | POA: Diagnosis not present

## 2022-12-12 DIAGNOSIS — F419 Anxiety disorder, unspecified: Secondary | ICD-10-CM | POA: Diagnosis not present

## 2022-12-12 DIAGNOSIS — E049 Nontoxic goiter, unspecified: Secondary | ICD-10-CM | POA: Diagnosis not present

## 2022-12-12 DIAGNOSIS — J449 Chronic obstructive pulmonary disease, unspecified: Secondary | ICD-10-CM | POA: Diagnosis not present

## 2022-12-12 DIAGNOSIS — F1120 Opioid dependence, uncomplicated: Secondary | ICD-10-CM | POA: Diagnosis not present

## 2022-12-12 DIAGNOSIS — F32A Depression, unspecified: Secondary | ICD-10-CM | POA: Diagnosis not present

## 2022-12-15 DIAGNOSIS — F1120 Opioid dependence, uncomplicated: Secondary | ICD-10-CM | POA: Diagnosis not present

## 2022-12-15 DIAGNOSIS — F419 Anxiety disorder, unspecified: Secondary | ICD-10-CM | POA: Diagnosis not present

## 2022-12-15 DIAGNOSIS — F1490 Cocaine use, unspecified, uncomplicated: Secondary | ICD-10-CM | POA: Diagnosis not present

## 2022-12-15 DIAGNOSIS — F32A Depression, unspecified: Secondary | ICD-10-CM | POA: Diagnosis not present

## 2022-12-18 DIAGNOSIS — F1490 Cocaine use, unspecified, uncomplicated: Secondary | ICD-10-CM | POA: Diagnosis not present

## 2022-12-18 DIAGNOSIS — F1120 Opioid dependence, uncomplicated: Secondary | ICD-10-CM | POA: Diagnosis not present

## 2022-12-18 DIAGNOSIS — I10 Essential (primary) hypertension: Secondary | ICD-10-CM | POA: Diagnosis not present

## 2022-12-18 DIAGNOSIS — F419 Anxiety disorder, unspecified: Secondary | ICD-10-CM | POA: Diagnosis not present

## 2022-12-25 DIAGNOSIS — F1120 Opioid dependence, uncomplicated: Secondary | ICD-10-CM | POA: Diagnosis not present

## 2022-12-29 DIAGNOSIS — F419 Anxiety disorder, unspecified: Secondary | ICD-10-CM | POA: Diagnosis not present

## 2022-12-29 DIAGNOSIS — F32A Depression, unspecified: Secondary | ICD-10-CM | POA: Diagnosis not present

## 2022-12-29 DIAGNOSIS — F1120 Opioid dependence, uncomplicated: Secondary | ICD-10-CM | POA: Diagnosis not present

## 2022-12-29 DIAGNOSIS — F1490 Cocaine use, unspecified, uncomplicated: Secondary | ICD-10-CM | POA: Diagnosis not present

## 2023-01-01 DIAGNOSIS — F1120 Opioid dependence, uncomplicated: Secondary | ICD-10-CM | POA: Diagnosis not present

## 2023-01-05 DIAGNOSIS — F1490 Cocaine use, unspecified, uncomplicated: Secondary | ICD-10-CM | POA: Diagnosis not present

## 2023-01-05 DIAGNOSIS — F419 Anxiety disorder, unspecified: Secondary | ICD-10-CM | POA: Diagnosis not present

## 2023-01-05 DIAGNOSIS — F1120 Opioid dependence, uncomplicated: Secondary | ICD-10-CM | POA: Diagnosis not present

## 2023-01-05 DIAGNOSIS — F32A Depression, unspecified: Secondary | ICD-10-CM | POA: Diagnosis not present

## 2023-01-08 DIAGNOSIS — F17200 Nicotine dependence, unspecified, uncomplicated: Secondary | ICD-10-CM | POA: Diagnosis not present

## 2023-01-08 DIAGNOSIS — F1490 Cocaine use, unspecified, uncomplicated: Secondary | ICD-10-CM | POA: Diagnosis not present

## 2023-01-08 DIAGNOSIS — F1120 Opioid dependence, uncomplicated: Secondary | ICD-10-CM | POA: Diagnosis not present

## 2023-01-12 DIAGNOSIS — F419 Anxiety disorder, unspecified: Secondary | ICD-10-CM | POA: Diagnosis not present

## 2023-01-12 DIAGNOSIS — F1120 Opioid dependence, uncomplicated: Secondary | ICD-10-CM | POA: Diagnosis not present

## 2023-01-12 DIAGNOSIS — F1490 Cocaine use, unspecified, uncomplicated: Secondary | ICD-10-CM | POA: Diagnosis not present

## 2023-01-12 DIAGNOSIS — F32A Depression, unspecified: Secondary | ICD-10-CM | POA: Diagnosis not present

## 2023-01-16 DIAGNOSIS — F1490 Cocaine use, unspecified, uncomplicated: Secondary | ICD-10-CM | POA: Diagnosis not present

## 2023-01-16 DIAGNOSIS — F17200 Nicotine dependence, unspecified, uncomplicated: Secondary | ICD-10-CM | POA: Diagnosis not present

## 2023-01-16 DIAGNOSIS — F1120 Opioid dependence, uncomplicated: Secondary | ICD-10-CM | POA: Diagnosis not present

## 2023-01-19 DIAGNOSIS — F1490 Cocaine use, unspecified, uncomplicated: Secondary | ICD-10-CM | POA: Diagnosis not present

## 2023-01-19 DIAGNOSIS — F1120 Opioid dependence, uncomplicated: Secondary | ICD-10-CM | POA: Diagnosis not present

## 2023-01-19 DIAGNOSIS — F32A Depression, unspecified: Secondary | ICD-10-CM | POA: Diagnosis not present

## 2023-01-22 DIAGNOSIS — F1490 Cocaine use, unspecified, uncomplicated: Secondary | ICD-10-CM | POA: Diagnosis not present

## 2023-01-22 DIAGNOSIS — I10 Essential (primary) hypertension: Secondary | ICD-10-CM | POA: Diagnosis not present

## 2023-01-22 DIAGNOSIS — F1120 Opioid dependence, uncomplicated: Secondary | ICD-10-CM | POA: Diagnosis not present

## 2023-01-22 DIAGNOSIS — F419 Anxiety disorder, unspecified: Secondary | ICD-10-CM | POA: Diagnosis not present

## 2023-01-29 DIAGNOSIS — I10 Essential (primary) hypertension: Secondary | ICD-10-CM | POA: Diagnosis not present

## 2023-01-29 DIAGNOSIS — F1120 Opioid dependence, uncomplicated: Secondary | ICD-10-CM | POA: Diagnosis not present

## 2023-01-29 DIAGNOSIS — J449 Chronic obstructive pulmonary disease, unspecified: Secondary | ICD-10-CM | POA: Diagnosis not present

## 2023-01-29 DIAGNOSIS — F419 Anxiety disorder, unspecified: Secondary | ICD-10-CM | POA: Diagnosis not present

## 2023-01-29 DIAGNOSIS — F32A Depression, unspecified: Secondary | ICD-10-CM | POA: Diagnosis not present

## 2023-01-29 DIAGNOSIS — F1490 Cocaine use, unspecified, uncomplicated: Secondary | ICD-10-CM | POA: Diagnosis not present

## 2023-02-02 DIAGNOSIS — F1120 Opioid dependence, uncomplicated: Secondary | ICD-10-CM | POA: Diagnosis not present

## 2023-02-02 DIAGNOSIS — F419 Anxiety disorder, unspecified: Secondary | ICD-10-CM | POA: Diagnosis not present

## 2023-02-02 DIAGNOSIS — F32A Depression, unspecified: Secondary | ICD-10-CM | POA: Diagnosis not present

## 2023-02-02 DIAGNOSIS — F1490 Cocaine use, unspecified, uncomplicated: Secondary | ICD-10-CM | POA: Diagnosis not present

## 2023-02-05 DIAGNOSIS — F1490 Cocaine use, unspecified, uncomplicated: Secondary | ICD-10-CM | POA: Diagnosis not present

## 2023-02-05 DIAGNOSIS — F419 Anxiety disorder, unspecified: Secondary | ICD-10-CM | POA: Diagnosis not present

## 2023-02-05 DIAGNOSIS — I10 Essential (primary) hypertension: Secondary | ICD-10-CM | POA: Diagnosis not present

## 2023-02-05 DIAGNOSIS — F32A Depression, unspecified: Secondary | ICD-10-CM | POA: Diagnosis not present

## 2023-02-05 DIAGNOSIS — J449 Chronic obstructive pulmonary disease, unspecified: Secondary | ICD-10-CM | POA: Diagnosis not present

## 2023-02-05 DIAGNOSIS — F1120 Opioid dependence, uncomplicated: Secondary | ICD-10-CM | POA: Diagnosis not present

## 2023-02-06 DIAGNOSIS — F419 Anxiety disorder, unspecified: Secondary | ICD-10-CM | POA: Diagnosis not present

## 2023-02-06 DIAGNOSIS — F1120 Opioid dependence, uncomplicated: Secondary | ICD-10-CM | POA: Diagnosis not present

## 2023-02-06 DIAGNOSIS — N289 Disorder of kidney and ureter, unspecified: Secondary | ICD-10-CM | POA: Diagnosis not present

## 2023-02-06 DIAGNOSIS — F1490 Cocaine use, unspecified, uncomplicated: Secondary | ICD-10-CM | POA: Diagnosis not present

## 2023-02-06 DIAGNOSIS — F32A Depression, unspecified: Secondary | ICD-10-CM | POA: Diagnosis not present

## 2023-02-06 DIAGNOSIS — J449 Chronic obstructive pulmonary disease, unspecified: Secondary | ICD-10-CM | POA: Diagnosis not present

## 2023-02-06 DIAGNOSIS — E049 Nontoxic goiter, unspecified: Secondary | ICD-10-CM | POA: Diagnosis not present

## 2023-02-06 DIAGNOSIS — F17200 Nicotine dependence, unspecified, uncomplicated: Secondary | ICD-10-CM | POA: Diagnosis not present

## 2023-02-06 DIAGNOSIS — I10 Essential (primary) hypertension: Secondary | ICD-10-CM | POA: Diagnosis not present

## 2023-02-09 DIAGNOSIS — F1490 Cocaine use, unspecified, uncomplicated: Secondary | ICD-10-CM | POA: Diagnosis not present

## 2023-02-09 DIAGNOSIS — F419 Anxiety disorder, unspecified: Secondary | ICD-10-CM | POA: Diagnosis not present

## 2023-02-09 DIAGNOSIS — F32A Depression, unspecified: Secondary | ICD-10-CM | POA: Diagnosis not present

## 2023-02-09 DIAGNOSIS — F1120 Opioid dependence, uncomplicated: Secondary | ICD-10-CM | POA: Diagnosis not present

## 2023-02-12 DIAGNOSIS — F419 Anxiety disorder, unspecified: Secondary | ICD-10-CM | POA: Diagnosis not present

## 2023-02-12 DIAGNOSIS — F1490 Cocaine use, unspecified, uncomplicated: Secondary | ICD-10-CM | POA: Diagnosis not present

## 2023-02-12 DIAGNOSIS — F32A Depression, unspecified: Secondary | ICD-10-CM | POA: Diagnosis not present

## 2023-02-12 DIAGNOSIS — I10 Essential (primary) hypertension: Secondary | ICD-10-CM | POA: Diagnosis not present

## 2023-02-12 DIAGNOSIS — F1120 Opioid dependence, uncomplicated: Secondary | ICD-10-CM | POA: Diagnosis not present

## 2023-02-16 DIAGNOSIS — F32A Depression, unspecified: Secondary | ICD-10-CM | POA: Diagnosis not present

## 2023-02-16 DIAGNOSIS — F419 Anxiety disorder, unspecified: Secondary | ICD-10-CM | POA: Diagnosis not present

## 2023-02-16 DIAGNOSIS — F1120 Opioid dependence, uncomplicated: Secondary | ICD-10-CM | POA: Diagnosis not present

## 2023-02-16 DIAGNOSIS — N289 Disorder of kidney and ureter, unspecified: Secondary | ICD-10-CM | POA: Diagnosis not present

## 2023-02-16 DIAGNOSIS — F1490 Cocaine use, unspecified, uncomplicated: Secondary | ICD-10-CM | POA: Diagnosis not present

## 2023-02-23 DIAGNOSIS — F32A Depression, unspecified: Secondary | ICD-10-CM | POA: Diagnosis not present

## 2023-02-23 DIAGNOSIS — F1120 Opioid dependence, uncomplicated: Secondary | ICD-10-CM | POA: Diagnosis not present

## 2023-02-23 DIAGNOSIS — F1490 Cocaine use, unspecified, uncomplicated: Secondary | ICD-10-CM | POA: Diagnosis not present

## 2023-02-23 DIAGNOSIS — F419 Anxiety disorder, unspecified: Secondary | ICD-10-CM | POA: Diagnosis not present

## 2023-03-09 DIAGNOSIS — F419 Anxiety disorder, unspecified: Secondary | ICD-10-CM | POA: Diagnosis not present

## 2023-03-09 DIAGNOSIS — N289 Disorder of kidney and ureter, unspecified: Secondary | ICD-10-CM | POA: Diagnosis not present

## 2023-03-09 DIAGNOSIS — F32A Depression, unspecified: Secondary | ICD-10-CM | POA: Diagnosis not present

## 2023-03-09 DIAGNOSIS — F1120 Opioid dependence, uncomplicated: Secondary | ICD-10-CM | POA: Diagnosis not present

## 2023-03-09 DIAGNOSIS — F1490 Cocaine use, unspecified, uncomplicated: Secondary | ICD-10-CM | POA: Diagnosis not present

## 2023-03-13 DIAGNOSIS — F1490 Cocaine use, unspecified, uncomplicated: Secondary | ICD-10-CM | POA: Diagnosis not present

## 2023-03-13 DIAGNOSIS — F419 Anxiety disorder, unspecified: Secondary | ICD-10-CM | POA: Diagnosis not present

## 2023-03-13 DIAGNOSIS — F1120 Opioid dependence, uncomplicated: Secondary | ICD-10-CM | POA: Diagnosis not present

## 2023-03-13 DIAGNOSIS — F17200 Nicotine dependence, unspecified, uncomplicated: Secondary | ICD-10-CM | POA: Diagnosis not present

## 2023-03-23 DIAGNOSIS — F1490 Cocaine use, unspecified, uncomplicated: Secondary | ICD-10-CM | POA: Diagnosis not present

## 2023-03-23 DIAGNOSIS — H6692 Otitis media, unspecified, left ear: Secondary | ICD-10-CM | POA: Diagnosis not present

## 2023-03-23 DIAGNOSIS — F419 Anxiety disorder, unspecified: Secondary | ICD-10-CM | POA: Diagnosis not present

## 2023-03-23 DIAGNOSIS — J029 Acute pharyngitis, unspecified: Secondary | ICD-10-CM | POA: Diagnosis not present

## 2023-03-23 DIAGNOSIS — F32A Depression, unspecified: Secondary | ICD-10-CM | POA: Diagnosis not present

## 2023-03-23 DIAGNOSIS — R52 Pain, unspecified: Secondary | ICD-10-CM | POA: Diagnosis not present

## 2023-03-23 DIAGNOSIS — F1120 Opioid dependence, uncomplicated: Secondary | ICD-10-CM | POA: Diagnosis not present

## 2023-04-06 DIAGNOSIS — F32A Depression, unspecified: Secondary | ICD-10-CM | POA: Diagnosis not present

## 2023-04-06 DIAGNOSIS — F419 Anxiety disorder, unspecified: Secondary | ICD-10-CM | POA: Diagnosis not present

## 2023-04-06 DIAGNOSIS — N289 Disorder of kidney and ureter, unspecified: Secondary | ICD-10-CM | POA: Diagnosis not present

## 2023-04-06 DIAGNOSIS — F1120 Opioid dependence, uncomplicated: Secondary | ICD-10-CM | POA: Diagnosis not present

## 2023-04-09 DIAGNOSIS — F32A Depression, unspecified: Secondary | ICD-10-CM | POA: Diagnosis not present

## 2023-04-09 DIAGNOSIS — F419 Anxiety disorder, unspecified: Secondary | ICD-10-CM | POA: Diagnosis not present

## 2023-04-09 DIAGNOSIS — F909 Attention-deficit hyperactivity disorder, unspecified type: Secondary | ICD-10-CM | POA: Diagnosis not present

## 2023-04-09 DIAGNOSIS — F1920 Other psychoactive substance dependence, uncomplicated: Secondary | ICD-10-CM | POA: Diagnosis not present

## 2023-04-09 DIAGNOSIS — I129 Hypertensive chronic kidney disease with stage 1 through stage 4 chronic kidney disease, or unspecified chronic kidney disease: Secondary | ICD-10-CM | POA: Diagnosis not present

## 2023-04-09 DIAGNOSIS — N184 Chronic kidney disease, stage 4 (severe): Secondary | ICD-10-CM | POA: Diagnosis not present

## 2023-04-09 DIAGNOSIS — Q632 Ectopic kidney: Secondary | ICD-10-CM | POA: Diagnosis not present

## 2023-04-09 DIAGNOSIS — F17200 Nicotine dependence, unspecified, uncomplicated: Secondary | ICD-10-CM | POA: Diagnosis not present

## 2023-04-09 DIAGNOSIS — R809 Proteinuria, unspecified: Secondary | ICD-10-CM | POA: Diagnosis not present

## 2023-04-09 DIAGNOSIS — F1490 Cocaine use, unspecified, uncomplicated: Secondary | ICD-10-CM | POA: Diagnosis not present

## 2023-04-09 DIAGNOSIS — F1120 Opioid dependence, uncomplicated: Secondary | ICD-10-CM | POA: Diagnosis not present

## 2023-04-10 DIAGNOSIS — R809 Proteinuria, unspecified: Secondary | ICD-10-CM | POA: Diagnosis not present

## 2023-04-10 DIAGNOSIS — N184 Chronic kidney disease, stage 4 (severe): Secondary | ICD-10-CM | POA: Diagnosis not present

## 2023-05-03 DIAGNOSIS — E049 Nontoxic goiter, unspecified: Secondary | ICD-10-CM | POA: Diagnosis not present

## 2023-05-03 DIAGNOSIS — J449 Chronic obstructive pulmonary disease, unspecified: Secondary | ICD-10-CM | POA: Diagnosis not present

## 2023-05-03 DIAGNOSIS — F32A Depression, unspecified: Secondary | ICD-10-CM | POA: Diagnosis not present

## 2023-05-03 DIAGNOSIS — I10 Essential (primary) hypertension: Secondary | ICD-10-CM | POA: Diagnosis not present

## 2023-05-03 DIAGNOSIS — F419 Anxiety disorder, unspecified: Secondary | ICD-10-CM | POA: Diagnosis not present

## 2023-05-03 DIAGNOSIS — F1490 Cocaine use, unspecified, uncomplicated: Secondary | ICD-10-CM | POA: Diagnosis not present

## 2023-05-03 DIAGNOSIS — F1120 Opioid dependence, uncomplicated: Secondary | ICD-10-CM | POA: Diagnosis not present

## 2023-05-04 DIAGNOSIS — F1490 Cocaine use, unspecified, uncomplicated: Secondary | ICD-10-CM | POA: Diagnosis not present

## 2023-05-04 DIAGNOSIS — F32A Depression, unspecified: Secondary | ICD-10-CM | POA: Diagnosis not present

## 2023-05-04 DIAGNOSIS — F1120 Opioid dependence, uncomplicated: Secondary | ICD-10-CM | POA: Diagnosis not present

## 2023-05-04 DIAGNOSIS — F419 Anxiety disorder, unspecified: Secondary | ICD-10-CM | POA: Diagnosis not present

## 2023-05-07 DIAGNOSIS — J449 Chronic obstructive pulmonary disease, unspecified: Secondary | ICD-10-CM | POA: Diagnosis not present

## 2023-05-07 DIAGNOSIS — F1490 Cocaine use, unspecified, uncomplicated: Secondary | ICD-10-CM | POA: Diagnosis not present

## 2023-05-07 DIAGNOSIS — F32A Depression, unspecified: Secondary | ICD-10-CM | POA: Diagnosis not present

## 2023-05-07 DIAGNOSIS — F419 Anxiety disorder, unspecified: Secondary | ICD-10-CM | POA: Diagnosis not present

## 2023-05-07 DIAGNOSIS — F1120 Opioid dependence, uncomplicated: Secondary | ICD-10-CM | POA: Diagnosis not present

## 2023-05-07 DIAGNOSIS — I10 Essential (primary) hypertension: Secondary | ICD-10-CM | POA: Diagnosis not present

## 2023-05-14 DIAGNOSIS — F1120 Opioid dependence, uncomplicated: Secondary | ICD-10-CM | POA: Diagnosis not present

## 2023-05-18 DIAGNOSIS — F419 Anxiety disorder, unspecified: Secondary | ICD-10-CM | POA: Diagnosis not present

## 2023-05-18 DIAGNOSIS — I10 Essential (primary) hypertension: Secondary | ICD-10-CM | POA: Diagnosis not present

## 2023-05-18 DIAGNOSIS — F1120 Opioid dependence, uncomplicated: Secondary | ICD-10-CM | POA: Diagnosis not present

## 2023-05-21 DIAGNOSIS — F1120 Opioid dependence, uncomplicated: Secondary | ICD-10-CM | POA: Diagnosis not present

## 2023-05-21 DIAGNOSIS — F32A Depression, unspecified: Secondary | ICD-10-CM | POA: Diagnosis not present

## 2023-05-21 DIAGNOSIS — F1490 Cocaine use, unspecified, uncomplicated: Secondary | ICD-10-CM | POA: Diagnosis not present

## 2023-05-21 DIAGNOSIS — F17200 Nicotine dependence, unspecified, uncomplicated: Secondary | ICD-10-CM | POA: Diagnosis not present

## 2023-05-28 DIAGNOSIS — F1490 Cocaine use, unspecified, uncomplicated: Secondary | ICD-10-CM | POA: Diagnosis not present

## 2023-05-28 DIAGNOSIS — F1120 Opioid dependence, uncomplicated: Secondary | ICD-10-CM | POA: Diagnosis not present

## 2023-05-28 DIAGNOSIS — I10 Essential (primary) hypertension: Secondary | ICD-10-CM | POA: Diagnosis not present

## 2023-05-28 DIAGNOSIS — F419 Anxiety disorder, unspecified: Secondary | ICD-10-CM | POA: Diagnosis not present

## 2023-06-01 DIAGNOSIS — F32A Depression, unspecified: Secondary | ICD-10-CM | POA: Diagnosis not present

## 2023-06-01 DIAGNOSIS — F1120 Opioid dependence, uncomplicated: Secondary | ICD-10-CM | POA: Diagnosis not present

## 2023-06-01 DIAGNOSIS — F1490 Cocaine use, unspecified, uncomplicated: Secondary | ICD-10-CM | POA: Diagnosis not present

## 2023-06-01 DIAGNOSIS — F419 Anxiety disorder, unspecified: Secondary | ICD-10-CM | POA: Diagnosis not present

## 2023-06-05 DIAGNOSIS — F1490 Cocaine use, unspecified, uncomplicated: Secondary | ICD-10-CM | POA: Diagnosis not present

## 2023-06-05 DIAGNOSIS — F32A Depression, unspecified: Secondary | ICD-10-CM | POA: Diagnosis not present

## 2023-06-05 DIAGNOSIS — F1120 Opioid dependence, uncomplicated: Secondary | ICD-10-CM | POA: Diagnosis not present

## 2023-06-05 DIAGNOSIS — I10 Essential (primary) hypertension: Secondary | ICD-10-CM | POA: Diagnosis not present

## 2023-06-05 DIAGNOSIS — F419 Anxiety disorder, unspecified: Secondary | ICD-10-CM | POA: Diagnosis not present

## 2023-06-12 DIAGNOSIS — F1120 Opioid dependence, uncomplicated: Secondary | ICD-10-CM | POA: Diagnosis not present

## 2023-06-12 DIAGNOSIS — F419 Anxiety disorder, unspecified: Secondary | ICD-10-CM | POA: Diagnosis not present

## 2023-06-12 DIAGNOSIS — F32A Depression, unspecified: Secondary | ICD-10-CM | POA: Diagnosis not present

## 2023-06-12 DIAGNOSIS — N289 Disorder of kidney and ureter, unspecified: Secondary | ICD-10-CM | POA: Diagnosis not present

## 2023-06-12 DIAGNOSIS — F1490 Cocaine use, unspecified, uncomplicated: Secondary | ICD-10-CM | POA: Diagnosis not present

## 2023-06-18 DIAGNOSIS — F419 Anxiety disorder, unspecified: Secondary | ICD-10-CM | POA: Diagnosis not present

## 2023-06-18 DIAGNOSIS — F1490 Cocaine use, unspecified, uncomplicated: Secondary | ICD-10-CM | POA: Diagnosis not present

## 2023-06-18 DIAGNOSIS — I10 Essential (primary) hypertension: Secondary | ICD-10-CM | POA: Diagnosis not present

## 2023-06-18 DIAGNOSIS — F1120 Opioid dependence, uncomplicated: Secondary | ICD-10-CM | POA: Diagnosis not present

## 2023-06-18 DIAGNOSIS — J449 Chronic obstructive pulmonary disease, unspecified: Secondary | ICD-10-CM | POA: Diagnosis not present

## 2023-06-18 DIAGNOSIS — F32A Depression, unspecified: Secondary | ICD-10-CM | POA: Diagnosis not present
